# Patient Record
Sex: Female | Born: 1985 | Race: Black or African American | Hispanic: No | Marital: Married | Smoking: Never smoker
Health system: Southern US, Community
[De-identification: ages and names within clinical notes are randomized; demographics above are authoritative.]

## PROBLEM LIST (undated history)

## (undated) DIAGNOSIS — Z30430 Encounter for insertion of intrauterine contraceptive device: Secondary | ICD-10-CM

## (undated) DIAGNOSIS — E538 Deficiency of other specified B group vitamins: Secondary | ICD-10-CM

## (undated) DIAGNOSIS — A549 Gonococcal infection, unspecified: Secondary | ICD-10-CM

## (undated) DIAGNOSIS — Z8719 Personal history of other diseases of the digestive system: Secondary | ICD-10-CM

## (undated) DIAGNOSIS — E669 Obesity, unspecified: Secondary | ICD-10-CM

## (undated) DIAGNOSIS — A539 Syphilis, unspecified: Secondary | ICD-10-CM

## (undated) DIAGNOSIS — R519 Headache, unspecified: Secondary | ICD-10-CM

## (undated) DIAGNOSIS — E559 Vitamin D deficiency, unspecified: Secondary | ICD-10-CM

## (undated) DIAGNOSIS — Z8619 Personal history of other infectious and parasitic diseases: Secondary | ICD-10-CM

## (undated) DIAGNOSIS — M2550 Pain in unspecified joint: Secondary | ICD-10-CM

## (undated) DIAGNOSIS — O0990 Supervision of high risk pregnancy, unspecified, unspecified trimester: Secondary | ICD-10-CM

## (undated) DIAGNOSIS — R079 Chest pain, unspecified: Secondary | ICD-10-CM

## (undated) DIAGNOSIS — A6000 Herpesviral infection of urogenital system, unspecified: Secondary | ICD-10-CM

## (undated) DIAGNOSIS — K219 Gastro-esophageal reflux disease without esophagitis: Secondary | ICD-10-CM

## (undated) DIAGNOSIS — E282 Polycystic ovarian syndrome: Secondary | ICD-10-CM

## (undated) DIAGNOSIS — G4730 Sleep apnea, unspecified: Secondary | ICD-10-CM

## (undated) DIAGNOSIS — M549 Dorsalgia, unspecified: Secondary | ICD-10-CM

## (undated) DIAGNOSIS — D649 Anemia, unspecified: Secondary | ICD-10-CM

## (undated) HISTORY — DX: Chest pain, unspecified: R07.9

## (undated) HISTORY — DX: Supervision of high risk pregnancy, unspecified, unspecified trimester: O09.90

## (undated) HISTORY — DX: Syphilis, unspecified: A53.9

## (undated) HISTORY — DX: Dorsalgia, unspecified: M54.9

## (undated) HISTORY — DX: Herpesviral infection of urogenital system, unspecified: A60.00

## (undated) HISTORY — DX: Obesity, unspecified: E66.9

## (undated) HISTORY — DX: Polycystic ovarian syndrome: E28.2

## (undated) HISTORY — DX: Gonococcal infection, unspecified: A54.9

## (undated) HISTORY — DX: Encounter for insertion of intrauterine contraceptive device: Z30.430

## (undated) HISTORY — DX: Personal history of other infectious and parasitic diseases: Z86.19

## (undated) HISTORY — DX: Deficiency of other specified B group vitamins: E53.8

## (undated) HISTORY — PX: BREAST SURGERY: SHX581

## (undated) HISTORY — DX: Pain in unspecified joint: M25.50

## (undated) HISTORY — PX: CHOLECYSTECTOMY: SHX55

## (undated) HISTORY — DX: Anemia, unspecified: D64.9

## (undated) HISTORY — PX: EYE SURGERY: SHX253

## (undated) HISTORY — PX: LAPAROSCOPIC GASTRIC SLEEVE RESECTION: SHX5895

## (undated) HISTORY — DX: Vitamin D deficiency, unspecified: E55.9

## (undated) NOTE — *Deleted (*Deleted)
TREATMENT   Manual Therapy C2-C7 CPA and UPA, grade I-II, 20s/bout x 1 bout on each level centrally as well as laterally bilateral, pt with less tenderness and guarding today compared to previous session; STM to L upper trap, levator scapulae, cervical paraspinals, and suboccipitals; Gentle manual cervical traction 15s hold/15s relax x 3; Suboccipital release x 20s;   Ther-ex Supine repeated cervical retractions with overpressure from therapist 5s hold x 10; Supine upper cervical nods 5s hold x 10; Supine cervical rotation, lateral flexion, and flexion isometrics 5s hold x 5 each bilaterally; Seated rows with green tband 2s hold x 10, cues for scapular retraction;   Pt educated throughout session about proper posture and technique with exercises. Improved exercise technique, movement at target joints, use of target muscles after min to mod verbal, visual, tactile cues.   Patient demonstrates excellent motivation and denies any increase in her pain during session. Overall she reports improvement of at least 80% with respect to her headaches and neck pain since initiating therapy.  She arrived late for session so time was somewhat limited today.Continued with cervical mobilizations and pt is less tender today.  Continued with deep neck flexor strengthening and initiated periscapular strengthening with seated rows.  Hopefully as patient continues to improve she will be able to decrease the frequency of her PT on the way towards a discharge.  Patient advised of neutral cervical positioning during sleeping and is able to trial different pillows to see if it helps with her sleep.  Patient also mentioned that she has OSA and has discussed with her MD the possible need for an additional sleep study to titrate her machine as she has not been wearing her sleep apnea machine.  Patient encouraged to continue her HEP and follow-up as scheduled.  Shewill benefit from PT services to address  deficits in headachesin order to return to full function at home.

---
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
## 2011-07-23 ENCOUNTER — Ambulatory Visit: Payer: Self-pay | Admitting: Family Medicine

## 2011-08-02 ENCOUNTER — Ambulatory Visit (INDEPENDENT_AMBULATORY_CARE_PROVIDER_SITE_OTHER): Payer: Managed Care, Other (non HMO) | Admitting: Family Medicine

## 2011-08-02 ENCOUNTER — Encounter: Payer: Self-pay | Admitting: Family Medicine

## 2011-08-02 VITALS — BP 102/80 | HR 83 | Temp 98.0°F | Ht 61.0 in | Wt 233.8 lb

## 2011-08-02 DIAGNOSIS — Z0000 Encounter for general adult medical examination without abnormal findings: Secondary | ICD-10-CM

## 2011-08-02 DIAGNOSIS — E669 Obesity, unspecified: Secondary | ICD-10-CM

## 2011-08-02 DIAGNOSIS — R0683 Snoring: Secondary | ICD-10-CM

## 2011-08-02 DIAGNOSIS — G4700 Insomnia, unspecified: Secondary | ICD-10-CM | POA: Insufficient documentation

## 2011-08-02 DIAGNOSIS — Z136 Encounter for screening for cardiovascular disorders: Secondary | ICD-10-CM

## 2011-08-02 DIAGNOSIS — R0609 Other forms of dyspnea: Secondary | ICD-10-CM

## 2011-08-02 DIAGNOSIS — Z113 Encounter for screening for infections with a predominantly sexual mode of transmission: Secondary | ICD-10-CM

## 2011-08-02 DIAGNOSIS — G4733 Obstructive sleep apnea (adult) (pediatric): Secondary | ICD-10-CM | POA: Insufficient documentation

## 2011-08-02 MED ORDER — TRAZODONE HCL 50 MG PO TABS: 25.0000 mg | ORAL_TABLET | Freq: Every evening | ORAL | Status: DC | PRN

## 2011-08-02 NOTE — Progress Notes (Signed)
Subjective:    Patient ID: Nancy Owen, female    DOB: 09/12/1985, 26 y.o.   MRN: [id number]  HPI 26 yo here to establish care.  Insomnia- past few years, increasing issues with falling and staying asleep.  Graduated from college and now working in a job that she is not pleased with.  Ultimately, she would like to go to law school.  Lives with parents who are supportive.  Feels a little down at times but denies being tearful.  She is a little anxious about her job situation.  Eating more, has gained 30 pounds in past year or so. No SI or HI.  Snores- boyfriend says that she often stops breathing in her sleep.  She often wakes her self up snoring.  Obesity- BMI 44.  Motivated to loose weight but is not going to gym due to her work location and sharing a car with her family. She knows she eats too much and would like to change her eating habits.  H/o syphilis- would like to be tested for STDs today.  Denies any vaginal discharge or dysuria.  Patient Active Problem List  Diagnoses  . Snoring  . Insomnia  . Screening for STD (sexually transmitted disease)  . Obesity   Past Medical History  Diagnosis Date  . History of syphilis   . Obesity    Past Surgical History  Procedure Date  . Fracture surgery    History  Substance Use Topics  . Smoking status: Never Smoker   . Smokeless tobacco: Not on file  . Alcohol Use: Not on file   No family history on file. No Known Allergies No current outpatient prescriptions on file prior to visit.   The PMH, PSH, Social History, Family History, Medications, and allergies have been reviewed in [facility], and have been updated if relevant.    Review of Systems    See HPI Patient reports no   fever ,adenopathy, persistant / recurrent hoarseness, swallowing issues, chest pain, edema,persistant / recurrent cough, hemoptysis, dyspnea(rest, exertional, paroxysmal nocturnal), gastrointestinal  bleeding (melena, rectal bleeding), abdominal pain,  excessive heart burn, GU symptoms(dysuria, hematuria, pyuria, voiding/incontinence  Issues) syncope, focal weakness, severe memory loss, concerning skin lesions, abnormal bruising/bleeding, major joint swelling, breast masses or abnormal vaginal bleeding.    Objective:   Physical Exam BP 102/80  Pulse 83  Temp(Src) 98 F (36.7 C) (Oral)  Ht 5\' 1"  (1.549 m)  Wt 233 lb 12 oz (106.028 kg)  BMI 44.17 kg/m2  LMP 07/15/2011  General:  Well-developed,well-nourished,in no acute distress; alert,appropriate and cooperative throughout examination, obese Head:  normocephalic and atraumatic.   Eyes:  vision grossly intact, pupils equal, pupils round, and pupils reactive to light.   Ears:  R ear normal and L ear normal.   Nose:  no external deformity.   Mouth:  good dentition.   Neck:  No deformities, masses, or tenderness noted. Lungs:  Normal respiratory effort, chest expands symmetrically. Lungs are clear to auscultation, no crackles or wheezes. Heart:  Normal rate and regular rhythm. S1 and S2 normal without gallop, murmur, click, rub or other extra sounds. Abdomen:  Bowel sounds positive,abdomen soft and non-tender without masses, organomegaly or hernias noted. Msk:  No deformity or scoliosis noted of thoracic or lumbar spine.   Extremities:  No clubbing, cyanosis, edema, or deformity noted with normal full range of motion of all joints.   Neurologic:  alert & oriented X3 and gait normal.   Skin:  Intact without suspicious lesions or  rashes Psych:  Cognition and judgment appear intact. Alert and cooperative with normal attention span and concentration. No apparent delusions, illusions, hallucinations    Assessment & Plan:   1. Snoring  Deteriorated- her weight is an issue and she is aware of this, see below.  Will also refer to pulmonary for possible sleep study to rule out sleep apnea. Ambulatory referral to Pulmonology  2. Insomnia  See above.  Adjustment disorder likely also playing a  roll.  Will start Trazodone 25-50 mg qhs prn insomnia. The patient indicates understanding of these issues and agrees with the plan.  Ambulatory referral to Pulmonology  3. Screening for STDs HIV Antibody ( Reflex), RPR, GC/chlamydia probe amp, urine  4. Obesity  Deteriorated. Will refer to nutritionist and discussed importance of getting back into an exercise routine. Amb ref to Medical Nutrition Therapy-MNT

## 2011-08-02 NOTE — Patient Instructions (Signed)
Good to see you. Please stop by to see Shirlee Limerick on your way out after you go to the lab.  Trazodone tablets What is this medicine? TRAZODONE (TRAZ oh done) is used to treat depression. This medicine may be used for other purposes; ask your health care provider or pharmacist if you have questions. What should I tell my health care provider before I take this medicine? They need to know if you have any of these conditions: -attempted suicide or thinking about it -bipolar disorder -bleeding problems -heart disease, or previous heart attack -irregular heart beat -kidney or liver disease -low levels of sodium in the blood -an unusual or allergic reaction to trazodone, other medicines, foods, dyes or preservatives -pregnant or trying to get pregnant -breast-feeding How should I use this medicine? Take this medicine by mouth with a glass of water. Follow the directions on the prescription label. Take this medicine shortly after a meal or a light snack. Take your medicine at regular intervals. Do not take your medicine more often than directed. Do not stop taking except on your doctor's advice. A special MedGuide will be given to you by the pharmacist with each prescription and refill. Be sure to read this information carefully each time. Talk to your pediatrician regarding the use of this medicine in children. Special care may be needed. Overdosage: If you think you have taken too much of this medicine contact a poison control center or emergency room at once. NOTE: This medicine is only for you. Do not share this medicine with others. What if I miss a dose? If you miss a dose, take it as soon as you can. If it is almost time for your next dose, take only that dose. Do not take double or extra doses. What may interact with this medicine? Do not take this medicine with any of the following medications: -linezolid -MAOIs like Carbex, Eldepryl, Marplan, Nardil, and Parnate -methylene  blue -nefazodone This medicine may also interact with the following medications: -barbiturates such as phenobarbital -certain antidepressants or tranquilizers -digoxin -herbal medicines that contain kava kava, St John's wort, or valerian -ketoconazole -medicines for HIV or AIDS -medicines for seizures -other medicines for depression -warfarin This list may not describe all possible interactions. Give your health care provider a list of all the medicines, herbs, non-prescription drugs, or dietary supplements you use. Also tell them if you smoke, drink alcohol, or use illegal drugs. Some items may interact with your medicine. What should I watch for while using this medicine? Visit your doctor or health care professional for regular checks on your progress. You may have to take this medicine for two weeks or more before you feel better. Patients and their families should watch out for worsening depression or thoughts of suicide. Also watch out for sudden or severe changes in feelings such as feeling anxious, agitated, panicky, irritable, hostile, aggressive, impulsive, severely restless, overly excited and hyperactive, or not being able to sleep. If this happens, especially at the beginning of antidepressant treatment or after a change in dose, call your health care professional. You may get drowsy, dizzy or have blurred vision. Do not drive, use machinery, or do anything that needs mental alertness until you know how this medicine affects you. Do not stand or sit up quickly, especially if you are an older patient. This reduces the risk of dizzy or fainting spells. Alcohol may increase dizziness or drowsiness. Avoid alcoholic drinks. This medicine can make your mouth dry. Chewing sugarless gum, sucking hard  candy and drinking plenty of water may help. Contact your doctor if the problem does not go away or is severe. Do not treat yourself for coughs, colds, or allergies without asking your doctor or  health care professional for advice. Some ingredients may increase possible side effects. If you are going to have surgery, tell your doctor or health care professional that you are taking this drug. What side effects may I notice from receiving this medicine? Side effects that you should report to your doctor or health care professional as soon as possible: -allergic reactions like skin rash, itching or hives, swelling of the face, lips, or tongue -fast, irregular heartbeat -feeling faint or lightheaded, falls -painful erections or other sexual dysfunction -suicidal thoughts or other mood changes -trembling Side effects that usually do not require medical attention (report to your doctor or health care professional if they continue or are bothersome): -constipation -headache -muscle aches or pains -nausea, vomiting -unusually weak or tired This list may not describe all possible side effects. Call your doctor for medical advice about side effects. You may report side effects to FDA at 1-800-FDA-1088. Where should I keep my medicine? Keep out of the reach of children. Store at room temperature between 15 and 30 degrees C (59 to 86 degrees F). Protect from light. Keep container tightly closed. Throw away any unused medicine after the expiration date. NOTE: This sheet is a summary. It may not cover all possible information. If you have questions about this medicine, talk to your doctor, pharmacist, or health care provider.  2012, Elsevier/Gold Standard. (02/08/2010 5:57:18 PM)

## 2011-08-03 LAB — BASIC METABOLIC PANEL
BUN/Creatinine Ratio: 15 (ref 8–20)
BUN: 11 mg/dL (ref 6–20)
CO2: 25 mmol/L (ref 20–32)
Chloride: 105 mmol/L (ref 97–108)
Glucose: 86 mg/dL (ref 65–99)

## 2011-08-03 LAB — LIPID PANEL
Chol/HDL Ratio: 4.6 ratio units — ABNORMAL HIGH (ref 0.0–4.4)
Triglycerides: 172 mg/dL — ABNORMAL HIGH (ref 0–114)

## 2011-08-03 LAB — RPR: RPR: REACTIVE — AB

## 2011-08-03 LAB — RPR, QUANT. (REFLEX): Rapid Plasma Reagin, Quant: 1:32 {titer} — ABNORMAL HIGH

## 2011-08-04 LAB — GC/CHLAMYDIA PROBE AMP: Chlamydia trachomatis, NAA: NEGATIVE

## 2011-08-05 ENCOUNTER — Other Ambulatory Visit: Payer: Self-pay | Admitting: *Deleted

## 2011-08-05 ENCOUNTER — Other Ambulatory Visit: Payer: Self-pay | Admitting: Family Medicine

## 2011-08-05 DIAGNOSIS — A539 Syphilis, unspecified: Secondary | ICD-10-CM | POA: Insufficient documentation

## 2011-08-05 MED ORDER — AZITHROMYCIN 500 MG PO TABS: 1000.0000 mg | ORAL_TABLET | Freq: Once | ORAL | Status: DC

## 2011-08-06 ENCOUNTER — Ambulatory Visit (INDEPENDENT_AMBULATORY_CARE_PROVIDER_SITE_OTHER): Payer: Managed Care, Other (non HMO) | Admitting: *Deleted

## 2011-08-06 DIAGNOSIS — A64 Unspecified sexually transmitted disease: Secondary | ICD-10-CM

## 2011-08-06 MED ORDER — CEFTRIAXONE SODIUM 1 G IJ SOLR
1.0000 g | Freq: Once | INTRAMUSCULAR | Status: AC
  Administered 2011-08-06: 1 g via INTRAMUSCULAR

## 2011-08-06 NOTE — Progress Notes (Signed)
Patient wanted to know if she will continue to have to get Rocephin shots each time she is tested and her test comes back positive?  Please advise.

## 2011-08-07 ENCOUNTER — Telehealth: Payer: Self-pay | Admitting: *Deleted

## 2011-08-07 NOTE — Telephone Encounter (Signed)
Patient wanted to know if she will continue to have to come in for a Rocephin injection each time her Syphilis test is positive?  Please advise.

## 2011-08-07 NOTE — Telephone Encounter (Signed)
To my knowledge patient only came in to get Rocephin injections, I don't think she had labs done yesterday.

## 2011-08-07 NOTE — Telephone Encounter (Signed)
Did she ever come in to have her other syphilis labs drawn?

## 2011-08-07 NOTE — Telephone Encounter (Signed)
No the Rocephin was not for syphilis.  It was to treat gonorrhea.

## 2011-08-07 NOTE — Telephone Encounter (Signed)
Terri, Do you know if her labs were drawn yesterday? Thanks, [employer]

## 2011-08-08 ENCOUNTER — Other Ambulatory Visit (INDEPENDENT_AMBULATORY_CARE_PROVIDER_SITE_OTHER): Payer: Managed Care, Other (non HMO)

## 2011-08-08 DIAGNOSIS — A539 Syphilis, unspecified: Secondary | ICD-10-CM

## 2011-08-10 LAB — RPR QUALITATIVE: RPR: REACTIVE — AB

## 2011-08-19 ENCOUNTER — Ambulatory Visit (INDEPENDENT_AMBULATORY_CARE_PROVIDER_SITE_OTHER): Payer: Managed Care, Other (non HMO) | Admitting: *Deleted

## 2011-08-19 DIAGNOSIS — A539 Syphilis, unspecified: Secondary | ICD-10-CM

## 2011-08-21 ENCOUNTER — Ambulatory Visit: Payer: Managed Care, Other (non HMO)

## 2011-08-21 ENCOUNTER — Encounter: Payer: Self-pay | Admitting: Pulmonary Disease

## 2011-08-21 ENCOUNTER — Ambulatory Visit (INDEPENDENT_AMBULATORY_CARE_PROVIDER_SITE_OTHER): Payer: Managed Care, Other (non HMO) | Admitting: Pulmonary Disease

## 2011-08-21 VITALS — BP 118/68 | HR 98 | Temp 98.3°F | Ht 61.0 in | Wt 232.8 lb

## 2011-08-21 DIAGNOSIS — G4733 Obstructive sleep apnea (adult) (pediatric): Secondary | ICD-10-CM

## 2011-08-21 DIAGNOSIS — R0683 Snoring: Secondary | ICD-10-CM

## 2011-08-21 MED ORDER — PENICILLIN G BENZATHINE 2400000 UNIT/4ML IM SUSP
2.4000 mL | Freq: Once | INTRAMUSCULAR | Status: AC
  Administered 2011-08-21: 2.4 mL via INTRAMUSCULAR

## 2011-08-21 NOTE — Patient Instructions (Signed)
Will arrange for sleep study, and will call once results are available. Work on weight reduction.

## 2011-08-21 NOTE — Progress Notes (Signed)
  Subjective:    Patient ID: Nancy Owen, female    DOB: [birth year], 26 y.o.   MRN: [id number]  HPI The patient is a 26 year old female who I've been asked to see for possible obstructive sleep apnea.  The patient has been noted to have loud snoring by observers, as well as an abnormal breathing pattern during sleep.  She has also noted frequent snoring and choking arousals, and only has occasional reflux symptoms.  She has frequent awakenings at night, and does not feel rested in the mornings upon arising.  She notes definite sleep pressured during the day with periods of inactivity unless she stays distracted with music or some other stimulation.  She will fall asleep in the evenings while watching television or movies, and then has issues with sleep onset after her napping.  She denies any sleepiness issues while driving.  The patient states that her weight has increased to 45 pounds in the last 2 years, and her Epworth score today is abnormal at 14.  Sleep Questionnaire: What time do you typically go to bed?( Between what hours) 12 midnight- 2 am How long does it take you to fall asleep? approx 45 mins to 1 hour How many times during the night do you wake up? 4 What time do you get out of bed to start your day? 0645 Do you drive or operate heavy machinery in your occupation? No How much has your weight changed (up or down) over the past two years? (In pounds) 45 lb (20.412 kg) Have you ever had a sleep study before? No Do you currently use CPAP? No Do you wear oxygen at any time? No     Review of Systems  Constitutional: Positive for unexpected weight change. Negative for fever.  HENT: Negative for ear pain, nosebleeds, congestion, sore throat, rhinorrhea, sneezing, trouble swallowing, dental problem, postnasal drip and sinus pressure.   Eyes: Negative for redness and itching.  Respiratory: Positive for shortness of breath. Negative for cough, chest tightness and wheezing.   Cardiovascular:  Positive for chest pain. Negative for palpitations and leg swelling.  Gastrointestinal: Positive for abdominal pain. Negative for nausea and vomiting.  Genitourinary: Negative for dysuria.  Musculoskeletal: Negative for joint swelling.  Skin: Negative for rash.  Neurological: Negative for headaches.  Hematological: Does not bruise/bleed easily.  Psychiatric/Behavioral: Negative for dysphoric mood. The patient is nervous/anxious.        Objective:   Physical Exam Constitutional:  Obese female, no acute distress  HENT:  Nares patent without discharge, but large turbinates.  Oropharynx without exudate, palate and uvula are thickened and elongated.  Narrowed posteriorly  Eyes:  Perrla, eomi, no scleral icterus  Neck:  No JVD, no TMG  Cardiovascular:  Normal rate, regular rhythm, no rubs or gallops.  No murmurs        Intact distal pulses  Pulmonary :  Normal breath sounds, no stridor or respiratory distress   No rales, rhonchi, or wheezing  Abdominal:  Soft, nondistended, bowel sounds present.  No tenderness noted.   Musculoskeletal:  1+ lower extremity edema noted, L>R  Lymph Nodes:  No cervical lymphadenopathy noted  Skin:  No cyanosis noted  Neurologic:  Alert, appropriate, moves all 4 extremities without obvious deficit.         Assessment & Plan:

## 2011-08-21 NOTE — Assessment & Plan Note (Signed)
The patient's history is very suggestive of obstructive sleep apnea.  She is obese, has abnormal upper airway anatomy, and is very symptomatic during the day.  I think her sleep onset issues are due to catnapping at night before going to bed.  I had a long discussion with her about sleep apnea, including its impact on her cardiovascular health and quality of life.  I think she needs to have a sleep study done, and the patient is agreeable.  I have encouraged her to work aggressively on weight loss.

## 2011-08-27 ENCOUNTER — Ambulatory Visit: Payer: Managed Care, Other (non HMO) | Admitting: *Deleted

## 2011-09-04 ENCOUNTER — Encounter: Payer: Self-pay | Admitting: *Deleted

## 2011-09-04 ENCOUNTER — Encounter: Payer: Managed Care, Other (non HMO) | Attending: Family Medicine | Admitting: *Deleted

## 2011-09-04 DIAGNOSIS — E669 Obesity, unspecified: Secondary | ICD-10-CM

## 2011-09-04 DIAGNOSIS — Z713 Dietary counseling and surveillance: Secondary | ICD-10-CM | POA: Insufficient documentation

## 2011-09-04 NOTE — Progress Notes (Signed)
  Medical Nutrition Therapy:  Appt start time: 1630 end time:  1730.  Assessment:  Primary concerns today: patient here for nutritional counseling for weight loss. She has recently moved here from [place] where she attended college. She is living with her parents and her younger brother and is working in [employer] of [employer].She has not met many friends here yet. She is interested in going to [employer] in the next year or two.   MEDICATIONS: see list   DIETARY INTAKE:  Usual eating pattern includes 3 meals and 1-3 snacks per day.  Everyday foods include food variety of all food groups.  Avoided foods include soda, apples, onions, [place] food, coffee, sweet drinks.    24-hr recall:  B ( AM): flat bread, 1 egg with pepper jack cheese, hot cocoa  Snk ( AM): occasional plain crackers, fresh fruit or Chex Mix  L ( PM): bring from home; Healthy Choice or Smart One dinner, OR salad with peppers, cuke, tomato, croutons, lite dressing with chicken, water Snk ( PM): same as morning, occasionally 1 bag M&M's about twice a week D ( PM): eats with family or prepares own; meat, starch, vegetable,  Snk ( PM): none Beverages: water, hot tea, hot cocoa, Silk soy milk  Usual physical activity: plans to attend [employer] classes at gym as able  Estimated energy needs: 1400 calories 158 g carbohydrates 105 g protein 39 g fat  Progress Towards Goal(s):  In progress.   Nutritional Diagnosis:  NI-1.5 Excessive energy intake As related to obesity.  As evidenced by BMI of 43.6% .    Intervention:  Nutrition counseling provided on calorie content of major nutrients, carb counting, reading food labels and way to increase activity level within her work schedule. Plan: Aim for 2-3 Carb Choices (30-45 grams) per meal, 0-1 per snack if hungry Read food labels for total carb of foods at home Increase activity every day by dancing either at home or join class at gym Consider Plate Method of portion  control by limiting both starch and meat to 1/4 of plate and give veggies 1/2 of plate  Handouts given during visit include:  Carb Counting and reading Food Labels  Meal plan card  Plate Method of Portion control  Monitoring/Evaluation:  Dietary intake, exercise, reading food labels, and body weight in 4 week(s).

## 2011-09-04 NOTE — Patient Instructions (Addendum)
Plan: Aim for 2-3 Carb Choices (30-45 grams) per meal, 0-1 per snack if hungry Read food labels for total carb of foods at home Increase activity every day by dancing either at home or join class at gym Consider Plate Method of portion control by limiting both starch and meat to 1/4 of plate and give veggies 1/2 of plate

## 2011-09-10 ENCOUNTER — Telehealth: Payer: Self-pay | Admitting: *Deleted

## 2011-09-10 NOTE — Telephone Encounter (Signed)
Ok to send

## 2011-09-10 NOTE — Telephone Encounter (Signed)
Received a call from Nancy Owen requesting that we send labs/reports of all positive STD test that this patient has had.  Fax to [place] at [phone].  Please advise.

## 2011-09-11 NOTE — Telephone Encounter (Signed)
Copies of test results from 08/05/11 faxed.

## 2011-09-12 ENCOUNTER — Ambulatory Visit (HOSPITAL_BASED_OUTPATIENT_CLINIC_OR_DEPARTMENT_OTHER): Payer: Managed Care, Other (non HMO) | Attending: Pulmonary Disease | Admitting: Radiology

## 2011-09-12 VITALS — Ht 61.0 in | Wt 225.0 lb

## 2011-09-12 DIAGNOSIS — R0683 Snoring: Secondary | ICD-10-CM

## 2011-09-12 DIAGNOSIS — G4733 Obstructive sleep apnea (adult) (pediatric): Secondary | ICD-10-CM | POA: Insufficient documentation

## 2011-09-24 ENCOUNTER — Telehealth: Payer: Self-pay | Admitting: *Deleted

## 2011-09-24 DIAGNOSIS — G4733 Obstructive sleep apnea (adult) (pediatric): Secondary | ICD-10-CM

## 2011-09-24 NOTE — Telephone Encounter (Signed)
Per KC, pt needs ov with him to discuss sleep study results.  Called and spoke with pt. Pt scheduled to come in on Friday 3/15 at 4:30 pm

## 2011-09-25 NOTE — Procedures (Signed)
Nancy Owen, Nancy Owen            ACCOUNT NO.:  [id number]  MEDICAL RECORD NO.:  [id number]          PATIENT TYPE:  OUT  LOCATION:  SLEEP CENTER                 FACILITY[facility]  [facility]  PHYSICIAN:  Barbaraann Share, MD,FCCPDATE OF BIRTH:  27-Jan-1988  DATE OF STUDY:  09/12/2011                           NOCTURNAL POLYSOMNOGRAM  REFERRING PHYSICIAN:  Barbaraann Share, MD,FCCP  LOCATION:  Sleep Lab.  REFERRING PHYSICIAN:  Barbaraann Share, MD,FCCP  INDICATION FOR STUDY:  Hypersomnia with sleep apnea.  EPWORTH SLEEPINESS SCORE:  14.  MEDICATIONS:  SLEEP ARCHITECTURE:  The patient had a total sleep time of 379 minutes with no slow-wave sleep and only 92 minutes of REM.  Sleep onset latency was normal at 14 minutes, and REM onset was normal at 72 minutes.  Sleep efficiency was excellent at 95%.  RESPIRATORY DATA:  The patient was found to have 77 apneas and 47 hypopneas, giving her an apnea-hypopnea index of 20 events per hour. The events occurred in all body positions, but were primarily during REM.  There was moderate snoring noted throughout.  OXYGEN DATA:  There was O2 desaturation as low as 80% with the patient's obstructive events.  CARDIAC DATA:  Occasional PAC noted, but no clinically significant arrhythmias were seen.  MOVEMENT-PARASOMNIA:  The patient had no significant leg jerks or other abnormal behaviors noted.  IMPRESSIONS-RECOMMENDATIONS: 1. Moderate obstructive sleep apnea/hypopnea syndrome with an AHI of     20 events per hour and O2 desaturation as low as 80%.  Treatment     for this degree of sleep apnea can include a trial of weight loss     alone, upper airway     surgery, dental appliance, and continuous positive airway pressure.     Clinical correlation is suggested. 2. Occasional premature atrial contraction noted, but no clinically     significant arrhythmias were seen.     Barbaraann Share, MD,FCCP Diplomate, American Board of  Sleep Medicine    [facility]  D:  09/24/2011 08:38:59  T:  09/25/2011 01:24:06  Job:  [id number]

## 2011-09-27 ENCOUNTER — Ambulatory Visit (INDEPENDENT_AMBULATORY_CARE_PROVIDER_SITE_OTHER): Payer: Managed Care, Other (non HMO) | Admitting: Pulmonary Disease

## 2011-09-27 ENCOUNTER — Encounter: Payer: Self-pay | Admitting: Pulmonary Disease

## 2011-09-27 VITALS — BP 110/70 | HR 78 | Temp 98.2°F | Ht 61.0 in | Wt 230.2 lb

## 2011-09-27 DIAGNOSIS — G4733 Obstructive sleep apnea (adult) (pediatric): Secondary | ICD-10-CM

## 2011-09-27 NOTE — Progress Notes (Signed)
  Subjective:    Patient ID: Nancy Owen, female    DOB: Aug 07, 1985, 26 y.o.   MRN: [id number]  HPI The patient comes in today for followup after her recent sleep study.  She was found to have moderate sleep apnea, with an AHI of 20 events per hour.  I have reviewed the study with her in detail, and answered all of her questions.   Review of Systems  Constitutional: Negative for fever and unexpected weight change.  HENT: Negative for ear pain, nosebleeds, congestion, sore throat, rhinorrhea, sneezing, trouble swallowing, dental problem, postnasal drip and sinus pressure.   Eyes: Negative for redness and itching.  Respiratory: Negative for cough, chest tightness, shortness of breath and wheezing.   Cardiovascular: Negative for palpitations and leg swelling.  Gastrointestinal: Negative for nausea and vomiting.  Genitourinary: Negative for dysuria.  Musculoskeletal: Negative for joint swelling.  Skin: Negative for rash.  Neurological: Positive for headaches.  Hematological: Does not bruise/bleed easily.  Psychiatric/Behavioral: Negative for dysphoric mood. The patient is not nervous/anxious.        Objective:   Physical Exam Obese female in no acute distress Nose without purulence or discharge noted Lower extremities without edema, no cyanosis Alert and oriented, moves all 4 extremities.       Assessment & Plan:

## 2011-09-27 NOTE — Assessment & Plan Note (Signed)
The patient has moderate obstructive sleep apnea by her recent sleep study, and has significant sleep disruption and daytime symptoms.  I have reviewed the various treatment options with her, including a trial of weight loss alone, upper airway surgery, dental appliance, and also CPAP.  After discussing with her, the patient wishes to try CPAP while working on weight loss.  I agreed this is her best option. I will set the patient up on cpap at a moderate pressure level to allow for desensitization, and will troubleshoot the device over the next 4-6weeks if needed.  The pt is to call me if having issues with tolerance.  Will then optimize the pressure once patient is able to wear cpap on a consistent basis.

## 2011-09-27 NOTE — Patient Instructions (Signed)
Will set up on cpap.  Please call if having tolerance issues. Work weight reduction followup with me in 6 weeks.

## 2011-10-02 ENCOUNTER — Ambulatory Visit: Payer: Managed Care, Other (non HMO) | Admitting: *Deleted

## 2011-10-18 ENCOUNTER — Ambulatory Visit: Payer: Managed Care, Other (non HMO) | Admitting: Family Medicine

## 2011-11-01 ENCOUNTER — Ambulatory Visit: Payer: Managed Care, Other (non HMO) | Admitting: Pulmonary Disease

## 2011-12-17 ENCOUNTER — Encounter: Payer: Self-pay | Admitting: Family Medicine

## 2011-12-17 ENCOUNTER — Ambulatory Visit (INDEPENDENT_AMBULATORY_CARE_PROVIDER_SITE_OTHER): Payer: Managed Care, Other (non HMO) | Admitting: Family Medicine

## 2011-12-17 VITALS — BP 102/70 | HR 84 | Temp 98.0°F | Wt 233.0 lb

## 2011-12-17 DIAGNOSIS — A539 Syphilis, unspecified: Secondary | ICD-10-CM

## 2011-12-17 MED ORDER — PENICILLIN G BENZATHINE 1200000 UNIT/2ML IM SUSP
2.4000 10*6.[IU] | Freq: Once | INTRAMUSCULAR | Status: AC
  Administered 2011-12-17: 2.4 10*6.[IU] via INTRAMUSCULAR

## 2011-12-17 NOTE — Patient Instructions (Signed)
Please come back next week (and the week after) to receive your two other doses of penicillin. Please return in August for a lab visit to recheck your quantitative syphilis count.

## 2011-12-17 NOTE — Progress Notes (Signed)
Addended by: Eliezer Bottom on: 12/17/2011 11:21 AM   Modules accepted: Orders

## 2011-12-17 NOTE — Progress Notes (Signed)
  Subjective:    Patient ID: Nancy Owen, female    DOB: 01/22/1986, 26 y.o.   MRN: [id number]  HPI  26 yo here to follow up STDs.  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  Receive Penicillin G 2.4 on 08/28/2011. Per pt, had a h/o testing positive for syphilis within the year prior however today she states she received three doses of penicillin G 2.4 in December 2011.  Also tested positive for gonorrhea on 1/21. Given Rocephin 250 mg IM x 1 injection and send in rx for Azithromycin 1 gram x 1. She is not sure if this latest partner that gave her gonorrhea had syphillis.  She is no longer with him.  She would be due to have repeat RPR quant in August 2013 to make sure that titers were coming down.  She is here today for follow up.    Patient Active Problem List  Diagnoses  . OSA (obstructive sleep apnea)  . Insomnia  . Screening for STD (sexually transmitted disease)  . Obesity  . Syphilis   Past Medical History  Diagnosis Date  . History of syphilis   . Obesity   . Allergic rhinitis    Past Surgical History  Procedure Date  . Fracture surgery age 89    L left   History  Substance Use Topics  . Smoking status: Never Smoker   . Smokeless tobacco: Not on file  . Alcohol Use: Yes     not much now, just at holidays wine cooler   No family history on file. No Known Allergies Current Outpatient Prescriptions on File Prior to Visit  Medication Sig Dispense Refill  . Norethin-Eth Estradiol-Fe [facility] FE) 0.4-35 MG-MCG tablet Chew 1 tablet by mouth daily.      Marland Kitchen DISCONTD: traZODone (DESYREL) 50 MG tablet Take 0.5-1 tablets (25-50 mg total) by mouth at bedtime as needed for sleep.  30 tablet  3  . DISCONTD: traZODone (DESYREL) 50 MG tablet Take 50 mg by mouth at bedtime.       The PMH, PSH, Social History, Family History, Medications, and allergies have been reviewed in [facility], and have been updated if relevant.    Review of Systems See HPI      Objective:   Physical Exam BP 102/70  Pulse 84  Temp(Src) 98 F (36.7 C) (Oral)  Wt 233 lb (105.688 kg)  General:  Well-developed,well-nourished,in no acute distress; alert,appropriate and cooperative throughout examination Head:  normocephalic and atraumatic.   Msk:  No deformity or scoliosis noted of thoracic or lumbar spine.   Extremities:  No clubbing, cyanosis, edema, or deformity noted with normal full range of motion of all joints.   Neurologic:  alert & oriented X3 and gait normal.   Skin:  Intact without suspicious lesions or rashes Psych:  Cognition and judgment appear intact. Alert and cooperative with normal attention span and concentration. No apparent delusions, illusions, hallucinations      Assessment & Plan:   1. Syphilis  RPR, Rfx Qn RPR/Confirm TP   Unchanged.  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 Will treat with 3 more doses (weekly intervals) of penicillin- first dose received today. Recheck quant titers in August (6 months after she received first dose). The patient indicates understanding of these issues and agrees with the plan.

## 2011-12-24 ENCOUNTER — Ambulatory Visit (INDEPENDENT_AMBULATORY_CARE_PROVIDER_SITE_OTHER): Payer: Managed Care, Other (non HMO) | Admitting: Family Medicine

## 2011-12-24 DIAGNOSIS — A539 Syphilis, unspecified: Secondary | ICD-10-CM

## 2011-12-24 MED ORDER — PENICILLIN G BENZATHINE 1200000 UNIT/2ML IM SUSP
2.4000 10*6.[IU] | Freq: Once | INTRAMUSCULAR | Status: AC
  Administered 2011-12-24: 2.4 10*6.[IU] via INTRAMUSCULAR

## 2011-12-31 ENCOUNTER — Ambulatory Visit (INDEPENDENT_AMBULATORY_CARE_PROVIDER_SITE_OTHER): Payer: Managed Care, Other (non HMO) | Admitting: *Deleted

## 2011-12-31 DIAGNOSIS — A539 Syphilis, unspecified: Secondary | ICD-10-CM

## 2012-01-01 MED ORDER — PENICILLIN G BENZATHINE 1200000 UNIT/2ML IM SUSP
1.2000 10*6.[IU] | Freq: Once | INTRAMUSCULAR | Status: AC
  Administered 2011-12-31: 1.2 10*6.[IU] via INTRAMUSCULAR

## 2012-01-20 ENCOUNTER — Ambulatory Visit: Payer: Managed Care, Other (non HMO) | Admitting: Family Medicine

## 2012-01-23 ENCOUNTER — Encounter: Payer: Self-pay | Admitting: Family Medicine

## 2012-01-23 ENCOUNTER — Ambulatory Visit (INDEPENDENT_AMBULATORY_CARE_PROVIDER_SITE_OTHER): Payer: Managed Care, Other (non HMO) | Admitting: Family Medicine

## 2012-01-23 VITALS — BP 108/68 | HR 76 | Temp 98.1°F | Wt 239.0 lb

## 2012-01-23 DIAGNOSIS — Z3201 Encounter for pregnancy test, result positive: Secondary | ICD-10-CM

## 2012-01-23 MED ORDER — PRENATABS FA PO TABS: ORAL_TABLET | ORAL | Status: DC

## 2012-01-23 NOTE — Progress Notes (Signed)
  Subjective:    Patient ID: Nancy Owen, female    DOB: 01-Mar-1986, 26 y.o.   MRN: [id number]  HPI  26 yo G0 with h/o syphillis and gonorrhea here for positive home pregnancy test.  LMP 5/23.  She has had some breast tenderness. No n/v/d. She has been tired.  Boyfriend is supportive. She has not started taking a prenatal vitamin yet.  Patient Active Problem List  Diagnosis  . OSA (obstructive sleep apnea)  . Insomnia  . Screening for STD (sexually transmitted disease)  . Obesity  . Syphilis  . Positive pregnancy test   Past Medical History  Diagnosis Date  . History of syphilis   . Obesity   . Allergic rhinitis    Past Surgical History  Procedure Date  . Fracture surgery age 25    L left   History  Substance Use Topics  . Smoking status: Never Smoker   . Smokeless tobacco: Not on file  . Alcohol Use: Yes     not much now, just at holidays wine cooler   No family history on file. No Known Allergies Current Outpatient Prescriptions on File Prior to Visit  Medication Sig Dispense Refill  . Norethin-Eth Estradiol-Fe [facility] FE) 0.4-35 MG-MCG tablet Chew 1 tablet by mouth daily.       The PMH, PSH, Social History, Family History, Medications, and allergies have been reviewed in [facility], and have been updated if relevant.   Review of Systems See HPI    Objective:   Physical Exam BP 108/68  Pulse 76  Temp 98.1 F (36.7 C)  Wt 239 lb (108.41 kg)  LMP 12/05/2011 Gen:  Alert, pleasant, NAD Psych:  Good eye contact, not anxious or depressed appearing.    Assessment & Plan:   1. Positive pregnancy test  Ambulatory referral to Obstetrics / Gynecology   New- refer to OBGYN. Based on dates, [redacted] weeks pregnant today. Start PNV. See pt instructions for details.

## 2012-01-23 NOTE — Addendum Note (Signed)
Addended by: Eliezer Bottom on: 01/23/2012 02:44 PM   Modules accepted: Orders

## 2012-01-23 NOTE — Patient Instructions (Addendum)
Congratulations. Please start taking prenatal vitamins. Please stop by to see Shirlee Limerick on your way out.  Pregnancy If you are planning on getting pregnant, it is a good idea to make a preconception appointment with your care- giver to discuss having a healthy lifestyle before getting pregnant. Such as, diet, weight, exercise, taking prenatal vitamins especially folic acid (it helps prevent brain and spinal cord defects), avoiding alcohol, smoking and illegal drugs, medical problems (diabetes, convulsions), family history of genetic problems, working conditions and immunizations. It is better to have knowledge of these things and do something about them before getting pregnant. In your pregnancy, it is important to follow certain guidelines to have a healthy baby. It is very important to get good prenatal care and follow your caregiver's instructions. Prenatal care includes all the medical care you receive before your baby's birth. This helps to prevent problems during the pregnancy and childbirth. HOME CARE INSTRUCTIONS   Start your prenatal visits by the 12th week of pregnancy or before when possible. They are usually scheduled monthly at first. They are more often in the last 2 months before delivery. It is important that you keep your caregiver's appointments and follow your caregiver's instructions regarding medication use, exercise, and diet.   During pregnancy, you are providing food for you and your baby. Eat a regular, well-balanced diet. Choose foods such as meat, fish, milk and other dairy products, vegetables, fruits, whole-grain breads and cereals. Your caregiver will inform you of the ideal weight gain depending on your current height and weight. Drink lots of liquids. Try to drink 8 glasses of water a day.   Alcohol is associated with a number of birth defects including fetal alcohol syndrome. It is best to avoid alcohol completely. Smoking will cause low birth rate and prematurity. Use of  alcohol and nicotine during your pregnancy also increases the chances that your child will be chemically dependent later in their life and may contribute to SIDS (Sudden Infant Death Syndrome).   Do not use illegal drugs.   Only take prescription or over-the-counter medications that are recommended by your caregiver. Other medications can cause genetic and physical problems in the baby.   Morning sickness can often be helped by keeping soda crackers at the bedside. Eat a couple before arising in the morning.   A sexual relationship may be continued until near the end of pregnancy if there are no other problems such as early (premature) leaking of amniotic fluid from the membranes, vaginal bleeding, painful intercourse or belly (abdominal) pain.   Exercise regularly. Check with your caregiver if you are unsure of the safety of some of your exercises.   Do not use hot tubs, steam rooms or saunas. These increase the risk of fainting or passing out and hurting yourself and the baby. Swimming is OK for exercise. Get plenty of rest, including afternoon naps when possible especially in the third trimester.   Avoid toxic odors and chemicals.   Do not wear high heels. They may cause you to lose your balance and fall.   Do not lift over 5 pounds. If you do lift anything, lift with your legs and thighs, not your back.   Avoid long trips, especially in the third trimester.   If you have to travel out of the city or state, take a copy of your medical records with you.  SEEK IMMEDIATE MEDICAL CARE IF:   You develop an unexplained oral temperature above 102 F (38.9 C), or as your caregiver  suggests.   You have leaking of fluid from the vagina. If leaking membranes are suspected, take your temperature and inform your caregiver of this when you call.   There is vaginal spotting or bleeding. Notify your caregiver of the amount and how many pads are used.   You continue to feel sick to your stomach  (nauseous) and have no relief from remedies suggested, or you throw up (vomit) blood or coffee ground like materials.   You develop upper abdominal pain.   You have round ligament discomfort in the lower abdominal area. This still must be evaluated by your caregiver.   You feel contractions of the uterus.   You do not feel the baby move, or there is less movement than before.   You have painful urination.   You have abnormal vaginal discharge.   You have persistent diarrhea.   You get a severe headache.   You have problems with your vision.   You develop muscle weakness.   You feel dizzy and faint.   You develop shortness of breath.   You develop chest pain.   You have back pain that travels down to your leg and feet.   You feel irregular or a very fast heartbeat.   You develop excessive weight gain in a short period of time (5 pounds in 3 to 5 days).   You are involved with a domestic violence situation.  Document Released: 07/01/2005 Document Revised: 06/20/2011 Document Reviewed: 12/23/2008 [facility] Patient Information 2012 [place], [place].

## 2012-02-13 ENCOUNTER — Other Ambulatory Visit (INDEPENDENT_AMBULATORY_CARE_PROVIDER_SITE_OTHER): Payer: Managed Care, Other (non HMO)

## 2012-02-13 DIAGNOSIS — A539 Syphilis, unspecified: Secondary | ICD-10-CM

## 2012-02-14 ENCOUNTER — Other Ambulatory Visit: Payer: Managed Care, Other (non HMO)

## 2012-02-15 LAB — RPR QUALITATIVE: RPR: REACTIVE — AB

## 2012-05-21 ENCOUNTER — Other Ambulatory Visit: Payer: Managed Care, Other (non HMO)

## 2012-06-24 ENCOUNTER — Encounter: Payer: Self-pay | Admitting: Family Medicine

## 2012-06-24 ENCOUNTER — Other Ambulatory Visit (HOSPITAL_COMMUNITY)
Admission: RE | Admit: 2012-06-24 | Discharge: 2012-06-24 | Disposition: A | Discharge status: Home / Self Care | Payer: Managed Care, Other (non HMO) | Source: Ambulatory Visit | Attending: Family Medicine | Admitting: Family Medicine

## 2012-06-24 ENCOUNTER — Ambulatory Visit (INDEPENDENT_AMBULATORY_CARE_PROVIDER_SITE_OTHER): Payer: Managed Care, Other (non HMO) | Admitting: Family Medicine

## 2012-06-24 ENCOUNTER — Other Ambulatory Visit: Payer: Managed Care, Other (non HMO)

## 2012-06-24 VITALS — BP 100/64 | HR 76 | Temp 98.1°F | Ht 61.0 in | Wt 232.0 lb

## 2012-06-24 DIAGNOSIS — Z136 Encounter for screening for cardiovascular disorders: Secondary | ICD-10-CM

## 2012-06-24 DIAGNOSIS — Z23 Encounter for immunization: Secondary | ICD-10-CM

## 2012-06-24 DIAGNOSIS — Z01419 Encounter for gynecological examination (general) (routine) without abnormal findings: Secondary | ICD-10-CM | POA: Insufficient documentation

## 2012-06-24 DIAGNOSIS — Z113 Encounter for screening for infections with a predominantly sexual mode of transmission: Secondary | ICD-10-CM | POA: Insufficient documentation

## 2012-06-24 DIAGNOSIS — Z8619 Personal history of other infectious and parasitic diseases: Secondary | ICD-10-CM

## 2012-06-24 DIAGNOSIS — Z0000 Encounter for general adult medical examination without abnormal findings: Secondary | ICD-10-CM

## 2012-06-24 NOTE — Patient Instructions (Addendum)
Great to see you. Have a wonderful Christmas with your family in [place]. We will call you with your lab results.

## 2012-06-24 NOTE — Addendum Note (Signed)
Addended by: Eliezer Bottom on: 06/24/2012 08:45 AM   Modules accepted: Orders

## 2012-06-24 NOTE — Progress Notes (Signed)
Subjective:    Patient ID: Nancy Owen, female    DOB: Nov 17, 1985, 26 y.o.   MRN: [id number]  HPI 41 G1P0 here for CPX.   H/o STDs- Denies any vaginal discharge or dysuria. Treated for syphilis and gonorrhea in January.  Would like to be retested today.  She is sexually active and does use condoms.  No h/o abnormal pap smears.  Cannot remember when she had her last pap smear. No family h/o breast, uterine, or cervical CA.  Patient Active Problem List  Diagnosis  . OSA (obstructive sleep apnea)  . Insomnia  . Screening for STD (sexually transmitted disease)  . Obesity  . Syphilis  . Positive pregnancy test  . Routine general medical examination at a health care facility   Past Medical History  Diagnosis Date  . History of syphilis   . Obesity   . Allergic rhinitis    Past Surgical History  Procedure Date  . Fracture surgery age 89    L left   History  Substance Use Topics  . Smoking status: Never Smoker   . Smokeless tobacco: Not on file  . Alcohol Use: Yes     Comment: not much now, just at holidays wine cooler   No family history on file. No Known Allergies Current Outpatient Prescriptions on File Prior to Visit  Medication Sig Dispense Refill  . Norethin-Eth Estradiol-Fe [facility] FE) 0.4-35 MG-MCG tablet Chew 1 tablet by mouth daily.      . Prenatal Vit-Fe Fumarate-FA (PRENATABS FA) TABS 1 tab po daily.  30 each  11   The PMH, PSH, Social History, Family History, Medications, and allergies have been reviewed in [facility], and have been updated if relevant.    Review of Systems    See HPI Patient reports no   fever ,adenopathy, persistant / recurrent hoarseness, swallowing issues, chest pain, edema,persistant / recurrent cough, hemoptysis, dyspnea(rest, exertional, paroxysmal nocturnal), gastrointestinal  bleeding (melena, rectal bleeding), abdominal pain, excessive heart burn, GU symptoms(dysuria, hematuria, pyuria, voiding/incontinence  Issues) syncope,  focal weakness, severe memory loss, concerning skin lesions, abnormal bruising/bleeding, major joint swelling, breast masses or abnormal vaginal bleeding.    Objective:   Physical Exam BP 100/64  Pulse 76  Temp 98.1 F (36.7 C)  Ht 5\' 1"  (1.549 m)  Wt 232 lb (105.235 kg)  BMI 43.84 kg/m2   General:  Overweight ,well-nourished,in no acute distress; alert,appropriate and cooperative throughout examination Head:  normocephalic and atraumatic.   Eyes:  vision grossly intact, pupils equal, pupils round, and pupils reactive to light.   Ears:  R ear normal and L ear normal.   Nose:  no external deformity.   Mouth:  good dentition.   Neck:  No deformities, masses, or tenderness noted. Breasts:  No mass, nodules, thickening, tenderness, bulging, retraction, inflamation, nipple discharge or skin changes noted.   Lungs:  Normal respiratory effort, chest expands symmetrically. Lungs are clear to auscultation, no crackles or wheezes. Heart:  Normal rate and regular rhythm. S1 and S2 normal without gallop, murmur, click, rub or other extra sounds. Abdomen:  Bowel sounds positive,abdomen soft and non-tender without masses, organomegaly or hernias noted. Rectal:  no external abnormalities.   Genitalia:  Pelvic Exam:        External: normal female genitalia without lesions or masses        Vagina: normal without lesions or masses        Cervix: normal without lesions or masses        Adnexa:  normal bimanual exam without masses or fullness        Uterus: normal by palpation        Pap smear: performed Msk:  No deformity or scoliosis noted of thoracic or lumbar spine.   Extremities:  No clubbing, cyanosis, edema, or deformity noted with normal full range of motion of all joints.   Neurologic:  alert & oriented X3 and gait normal.   Skin:  Intact without suspicious lesions or rashes Cervical Nodes:  No lymphadenopathy noted Axillary Nodes:  No palpable lymphadenopathy Psych:  Cognition and judgment  appear intact. Alert and cooperative with normal attention span and concentration. No apparent delusions, illusions, hallucinations     Assessment & Plan:   1. Routine general medical examination at a health care facility  Reviewed preventive care protocols, scheduled due services, and updated immunizations Discussed nutrition, exercise, diet, and healthy lifestyle.  Comprehensive metabolic panel  2. Screening for STD (sexually transmitted disease)   Also discussed sexual activity, pregnancy risk, and STD risk.  Encouraged to get regular exercise.  HIV Antibody, RPR, Rfx Qn RPR/Confirm TP [[facility]]  3. History of syphilis  RPR, Rfx Qn RPR/Confirm TP [[facility]]  4. Screening for ischemic heart disease  Lipid Panel

## 2012-06-26 LAB — COMPREHENSIVE METABOLIC PANEL
Albumin/Globulin Ratio: 1.3 (ref 1.1–2.5)
Albumin: 3.9 g/dL (ref 3.5–5.5)
Alkaline Phosphatase: 83 IU/L (ref 39–117)
BUN/Creatinine Ratio: 14 (ref 8–20)
BUN: 12 mg/dL (ref 6–20)
Chloride: 103 mmol/L (ref 97–108)
GFR calc Af Amer: 113 mL/min/{1.73_m2} (ref >59)
GFR calc non Af Amer: 98 mL/min/{1.73_m2} (ref >59)
Globulin, Total: 3.1 g/dL (ref 1.5–4.5)
Glucose: 79 mg/dL (ref 65–99)
Total Bilirubin: 0.2 mg/dL (ref 0.0–1.2)
Total Protein: 7 g/dL (ref 6.0–8.5)

## 2012-06-26 LAB — LIPID PANEL
Chol/HDL Ratio: 4.7 ratio units — ABNORMAL HIGH (ref 0.0–4.4)
LDL Calculated: 109 mg/dL (ref 0–119)
VLDL Cholesterol Cal: 17 mg/dL (ref 5–40)

## 2012-06-26 LAB — HIV ANTIBODY (ROUTINE TESTING W REFLEX)
HIV 1/O/2 Abs-Index Value: <1 (ref <1.00)
HIV-1/HIV-2 Ab: NONREACTIVE

## 2012-06-26 LAB — RPR QUALITATIVE: RPR: REACTIVE — AB

## 2012-06-29 ENCOUNTER — Encounter: Payer: Self-pay | Admitting: Family Medicine

## 2012-06-29 ENCOUNTER — Other Ambulatory Visit: Payer: Self-pay | Admitting: Family Medicine

## 2012-06-29 DIAGNOSIS — A539 Syphilis, unspecified: Secondary | ICD-10-CM

## 2012-06-29 LAB — HM PAP SMEAR: HM Pap smear: NORMAL

## 2012-08-12 NOTE — Progress Notes (Signed)
This encounter was used for a nurse injection visit only.

## 2012-12-15 ENCOUNTER — Ambulatory Visit (INDEPENDENT_AMBULATORY_CARE_PROVIDER_SITE_OTHER): Payer: Managed Care, Other (non HMO) | Admitting: *Deleted

## 2012-12-15 DIAGNOSIS — Z111 Encounter for screening for respiratory tuberculosis: Secondary | ICD-10-CM

## 2012-12-17 LAB — TB SKIN TEST
Induration: 0 mm
TB Skin Test: NEGATIVE

## 2013-02-08 ENCOUNTER — Ambulatory Visit (INDEPENDENT_AMBULATORY_CARE_PROVIDER_SITE_OTHER): Payer: Managed Care, Other (non HMO) | Admitting: Family Medicine

## 2013-02-08 ENCOUNTER — Encounter: Payer: Self-pay | Admitting: Family Medicine

## 2013-02-08 VITALS — BP 110/90 | HR 100 | Temp 97.9°F | Ht 61.0 in | Wt 232.0 lb

## 2013-02-08 DIAGNOSIS — R310 Gross hematuria: Secondary | ICD-10-CM

## 2013-02-08 DIAGNOSIS — J069 Acute upper respiratory infection, unspecified: Secondary | ICD-10-CM

## 2013-02-08 DIAGNOSIS — R509 Fever, unspecified: Secondary | ICD-10-CM

## 2013-02-08 LAB — POCT URINALYSIS DIPSTICK
Ketones, UA: NEGATIVE
Spec Grav, UA: 1.025
Urobilinogen, UA: NEGATIVE
pH, UA: 6

## 2013-02-08 MED ORDER — PENICILLIN V POTASSIUM 500 MG PO TABS: 500.0000 mg | ORAL_TABLET | Freq: Three times a day (TID) | ORAL | Status: AC

## 2013-02-08 NOTE — Progress Notes (Signed)
  Subjective:    Patient ID: Nancy Owen, female    DOB: 02-Nov-1985, 27 y.o.   MRN: [id number]  HPI 27 yo here with cough, diarrhea and fever.  Per pt, initially told us her mom told her fever was 105 yesterday.  She called her during office visit and her mother stated it was 100.5 Cough has been intermittent for one month but worse past few days. + sore throat  Had diarrhea all weekend, again this am but it is improving. No blood or mucous in her stools.  Denies lower abdominal pain but is a little TTP over suprapubic area on exam.  No dysuria or back pain.  Patient Active Problem List   Diagnosis Date Noted  . Fever, unspecified 02/08/2013  . Acute upper respiratory infections of unspecified site 02/08/2013  . Routine general medical examination at a health care facility 06/24/2012  . History of syphilis 06/24/2012  . Positive pregnancy test 01/23/2012  . Syphilis 08/05/2011  . OSA (obstructive sleep apnea) 08/02/2011  . Insomnia 08/02/2011  . Screening for STD (sexually transmitted disease) 08/02/2011  . Obesity 08/02/2011   Past Medical History  Diagnosis Date  . History of syphilis   . Obesity   . Allergic rhinitis    Past Surgical History  Procedure Laterality Date  . Fracture surgery  age 27    L left   History  Substance Use Topics  . Smoking status: Never Smoker   . Smokeless tobacco: Not on file  . Alcohol Use: Yes     Comment: not much now, just at holidays wine cooler   No family history on file. No Known Allergies No current outpatient prescriptions on file prior to visit.   No current facility-administered medications on file prior to visit.   The PMH, PSH, Social History, Family History, Medications, and allergies have been reviewed in [facility], and have been updated if relevant.    Review of Systems See HPI    Objective:   Physical Exam BP 110/90  Pulse 100  Temp(Src) 97.9 F (36.6 C)  Ht 5\' 1"  (1.549 m)  Wt 232 lb (105.235 kg)  BMI  43.86 kg/m2  SpO2 98% Gen:  Alert, pleasant, dry/hacky cough, NAD HEENT:  Mild erythema with enlarged tonsils. Resp:  CTA bilaterally CVS:  RRR Abd:   Mild suprapubic tenderness, no rebound, guarding, or CVA tenderness Ext:  No edema     Assessment & Plan:  1. Fever, unspecified New- rapid strep positive.  Treat for strep pharyngitis with PCN 500 mg three times daily x 10 days. UA - 2+ blood, 1+ LE, will send for cx.  - POCT rapid strep A - Culture, Group A Strep - POCT urinalysis dipstick  2. Acute upper respiratory infections of unspecified site See above.

## 2013-02-08 NOTE — Addendum Note (Signed)
Addended by: Eliezer Bottom on: 02/08/2013 10:12 AM   Modules accepted: Orders

## 2013-02-08 NOTE — Patient Instructions (Addendum)
Good to see you. Please take penicillin as directed- 1 tablet three times daily for 10 days.  Ibuprofen as needed for pain/fever (no more than 800 mg three times daily), with food.  Strep Throat Strep throat is an infection of the throat caused by a bacteria named Streptococcus pyogenes. Your caregiver may call the infection streptococcal "tonsillitis" or "pharyngitis" depending on whether there are signs of inflammation in the tonsils or back of the throat. Strep throat is most common in children aged 5 15 years during the cold months of the year, but it can occur in people of any age during any season. This infection is spread from person to person (contagious) through coughing, sneezing, or other close contact. SYMPTOMS   Fever or chills.  Painful, swollen, red tonsils or throat.  Pain or difficulty when swallowing.  White or yellow spots on the tonsils or throat.  Swollen, tender lymph nodes or "glands" of the neck or under the jaw.  Red rash all over the body (rare). DIAGNOSIS  Many different infections can cause the same symptoms. A test must be done to confirm the diagnosis so the right treatment can be given. A "rapid strep test" can help your caregiver make the diagnosis in a few minutes. If this test is not available, a light swab of the infected area can be used for a throat culture test. If a throat culture test is done, results are usually available in a day or two. TREATMENT  Strep throat is treated with antibiotic medicine. HOME CARE INSTRUCTIONS   Gargle with 1 tsp of salt in 1 cup of warm water, 3 4 times per day or as needed for comfort.  Family members who also have a sore throat or fever should be tested for strep throat and treated with antibiotics if they have the strep infection.  Make sure everyone in your household washes their hands well.  Do not share food, drinking cups, or personal items that could cause the infection to spread to others.  You may need  to eat a soft food diet until your sore throat gets better.  Drink enough water and fluids to keep your urine clear or pale yellow. This will help prevent dehydration.  Get plenty of rest.  Stay home from school, daycare, or work until you have been on antibiotics for 24 hours.  Only take over-the-counter or prescription medicines for pain, discomfort, or fever as directed by your caregiver.  If antibiotics are prescribed, take them as directed. Finish them even if you start to feel better. SEEK MEDICAL CARE IF:   The glands in your neck continue to enlarge.  You develop a rash, cough, or earache.  You cough up green, yellow-brown, or bloody sputum.  You have pain or discomfort not controlled by medicines.  Your problems seem to be getting worse rather than better. SEEK IMMEDIATE MEDICAL CARE IF:   You develop any new symptoms such as vomiting, severe headache, stiff or painful neck, chest pain, shortness of breath, or trouble swallowing.  You develop severe throat pain, drooling, or changes in your voice.  You develop swelling of the neck, or the skin on the neck becomes red and tender.  You have a fever.  You develop signs of dehydration, such as fatigue, dry mouth, and decreased urination.  You become increasingly sleepy, or you cannot wake up completely. Document Released: 06/28/2000 Document Revised: 06/17/2012 Document Reviewed: 08/30/2010 [facility] Patient Information 2014 [place], [place].

## 2013-02-09 LAB — URINE CULTURE

## 2013-05-20 ENCOUNTER — Other Ambulatory Visit: Payer: Self-pay

## 2013-09-16 ENCOUNTER — Ambulatory Visit (INDEPENDENT_AMBULATORY_CARE_PROVIDER_SITE_OTHER): Payer: Managed Care, Other (non HMO) | Admitting: Internal Medicine

## 2013-09-16 ENCOUNTER — Encounter: Payer: Self-pay | Admitting: Internal Medicine

## 2013-09-16 VITALS — BP 118/76 | HR 102 | Temp 97.9°F | Wt 244.5 lb

## 2013-09-16 DIAGNOSIS — B9689 Other specified bacterial agents as the cause of diseases classified elsewhere: Secondary | ICD-10-CM

## 2013-09-16 DIAGNOSIS — A499 Bacterial infection, unspecified: Secondary | ICD-10-CM

## 2013-09-16 DIAGNOSIS — B373 Candidiasis of vulva and vagina: Secondary | ICD-10-CM

## 2013-09-16 DIAGNOSIS — B3731 Acute candidiasis of vulva and vagina: Secondary | ICD-10-CM

## 2013-09-16 DIAGNOSIS — R300 Dysuria: Secondary | ICD-10-CM

## 2013-09-16 DIAGNOSIS — N760 Acute vaginitis: Secondary | ICD-10-CM

## 2013-09-16 MED ORDER — MICONAZOLE NITRATE 100 MG VA SUPP: 100.0000 mg | Freq: Every day | VAGINAL | Status: DC

## 2013-09-16 MED ORDER — METRONIDAZOLE 0.75 % VA GEL: 1.0000 | Freq: Two times a day (BID) | VAGINAL | Status: DC

## 2013-09-16 NOTE — Patient Instructions (Addendum)
Candida Infection, Adult A candida infection (also called yeast, fungus and Monilia infection) is an overgrowth of yeast that can occur anywhere on the body. A yeast infection commonly occurs in warm, moist body areas. Usually, the infection remains localized but can spread to become a systemic infection. A yeast infection may be a sign of a more severe disease such as diabetes, leukemia, or AIDS. A yeast infection can occur in both men and women. In women, Candida vaginitis is a vaginal infection. It is one of the most common causes of vaginitis. Men usually do not have symptoms or know they have an infection until other problems develop. Men may find out they have a yeast infection because their sex partner has a yeast infection. Uncircumcised men are more likely to get a yeast infection than circumcised men. This is because the uncircumcised glans is not exposed to air and does not remain as dry as that of a circumcised glans. Older adults may develop yeast infections around dentures. CAUSES  Women  Antibiotics.  Steroid medication taken for a long time.  Being overweight (obese).  Diabetes.  Poor immune condition.  Certain serious medical conditions.  Immune suppressive medications for organ transplant patients.  Chemotherapy.  Pregnancy.  Menstration.  Stress and fatigue.  Intravenous drug use.  Oral contraceptives.  Wearing tight-fitting clothes in the crotch area.  Catching it from a sex partner who has a yeast infection.  Spermicide.  Intravenous, urinary, or other catheters. Men  Catching it from a sex partner who has a yeast infection.  Having oral or anal sex with a person who has the infection.  Spermicide.  Diabetes.  Antibiotics.  Poor immune system.  Medications that suppress the immune system.  Intravenous drug use.  Intravenous, urinary, or other catheters. SYMPTOMS  Women  Thick, white vaginal discharge.  Vaginal itching.  Redness and  swelling in and around the vagina.  Irritation of the lips of the vagina and perineum.  Blisters on the vaginal lips and perineum.  Painful sexual intercourse.  Low blood sugar (hypoglycemia).  Painful urination.  Bladder infections.  Intestinal problems such as constipation, indigestion, bad breath, bloating, increase in gas, diarrhea, or loose stools. Men  Men may develop intestinal problems such as constipation, indigestion, bad breath, bloating, increase in gas, diarrhea, or loose stools.  Dry, cracked skin on the penis with itching or discomfort.  Jock itch.  Dry, flaky skin.  Athlete's foot.  Hypoglycemia. DIAGNOSIS  Women  A history and an exam are performed.  The discharge may be examined under a microscope.  A culture may be taken of the discharge. Men  A history and an exam are performed.  Any discharge from the penis or areas of cracked skin will be looked at under the microscope and cultured.  Stool samples may be cultured. TREATMENT  Women  Vaginal antifungal suppositories and creams.  Medicated creams to decrease irritation and itching on the outside of the vagina.  Warm compresses to the perineal area to decrease swelling and discomfort.  Oral antifungal medications.  Medicated vaginal suppositories or cream for repeated or recurrent infections.  Wash and dry the irritation areas before applying the cream.  Eating yogurt with lactobacillus may help with prevention and treatment.  Sometimes painting the vagina with gentian violet solution may help if creams and suppositories do not work. Men  Antifungal creams and oral antifungal medications.  Sometimes treatment must continue for 30 days after the symptoms go away to prevent recurrence. HOME CARE  INSTRUCTIONS  Women  Use cotton underwear and avoid tight-fitting clothing.  Avoid colored, scented toilet paper and deodorant tampons or pads.  Do not douche.  Keep your diabetes  under control.  Finish all the prescribed medications.  Keep your skin clean and dry.  Consume milk or yogurt with lactobacillus active culture regularly. If you get frequent yeast infections and think that is what the infection is, there are over-the-counter medications that you can get. If the infection does not show healing in 3 days, talk to your caregiver.  Tell your sex partner you have a yeast infection. Your partner may need treatment also, especially if your infection does not clear up or recurs. Men  Keep your skin clean and dry.  Keep your diabetes under control.  Finish all prescribed medications.  Tell your sex partner that you have a yeast infection so they can be treated if necessary. SEEK MEDICAL CARE IF:   Your symptoms do not clear up or worsen in one week after treatment.  You have an oral temperature above 102 F (38.9 C).  You have trouble swallowing or eating for a prolonged time.  You develop blisters on and around your vagina.  You develop vaginal bleeding and it is not your menstrual period.  You develop abdominal pain.  You develop intestinal problems as mentioned above.  You get weak or lightheaded.  You have painful or increased urination.  You have pain during sexual intercourse. MAKE SURE YOU:   Understand these instructions.  Will watch your condition.  Will get help right away if you are not doing well or get worse. Document Released: 08/08/2004 Document Revised: 09/23/2011 Document Reviewed: 11/20/2009 [facility] Patient Information 2014 [place].

## 2013-09-16 NOTE — Progress Notes (Signed)
HPI  Pt presents to the clinic today with c/o vaginal discharge and odor. She reports this started about  4 days ago. She reports the vaginal discharge is thin and white. She does c/o vaginal itching and dysuria as well. She has not tried anything OTC. She is not currently sexually active but she is pregnant, due in July.   Review of Systems  Past Medical History  Diagnosis Date  . History of syphilis   . Obesity   . Allergic rhinitis     History reviewed. No pertinent family history.  History   Social History  . Marital Status: Single    Spouse Name: N/A    Number of Children: 0  . Years of Education: N/A   Occupational History  . AR specialist- [employer]   Social History Main Topics  . Smoking status: Never Smoker   . Smokeless tobacco: Not on file  . Alcohol Use: Yes     Comment: not much now, just at holidays wine cooler  . Drug Use: Not on file  . Sexual Activity: Not on file   Other Topics Concern  . Not on file   Social History Narrative   Lives with mother, brother, and step father    No Known Allergies  Constitutional: Denies fever, malaise, fatigue, headache or abrupt weight changes.   GU: Pt reports vaginal discharge, odor and pain with urination. Denies burning sensation, blood in urine. Skin: Denies redness, rashes, lesions or ulcercations.   No other specific complaints in a complete review of systems (except as listed in HPI above).    Objective:   Physical Exam  BP 118/76  Pulse 102  Temp(Src) 97.9 F (36.6 C) (Oral)  Wt 244 lb 8 oz (110.904 kg)  SpO2 98% Wt Readings from Last 3 Encounters:  09/16/13 244 lb 8 oz (110.904 kg)  02/08/13 232 lb (105.235 kg)  06/24/12 232 lb (105.235 kg)    General: Appears her stated age, obese but well developed, well nourished in NAD. Cardiovascular: Normal rate and rhythm. S1,S2 noted.  No murmur, rubs or gallops noted. No JVD or BLE edema. No carotid bruits noted. Pulmonary/Chest: Normal  effort and positive vesicular breath sounds. No respiratory distress. No wheezes, rales or ronchi noted.  Abdomen: Soft and nontender. Normal bowel sounds, no bruits noted. No distention or masses noted. Liver, spleen and kidneys non palpable. Tender to palpation over the bladder area. No CVA tenderness.      Assessment & Plan:   Dysuria, vaginal discharge and odor secondary to BV and yeast:  Urinalysis: Mod leuks, trace blood Wet prep: small yeast, moderate clue cells, negative trcih eRx for Miconazole 100 mg vaginally x 7 days eRx for Metrogel BID x 5 days Drink plenty of fluids   RTC as needed or if symptoms persist.

## 2013-09-16 NOTE — Progress Notes (Signed)
Pre visit review using our clinic review tool, if applicable. No additional management support is needed unless otherwise documented below in the visit note. 

## 2013-10-04 ENCOUNTER — Encounter: Payer: Self-pay | Admitting: Maternal and Fetal Medicine

## 2013-10-11 ENCOUNTER — Ambulatory Visit: Payer: Self-pay | Admitting: Maternal and Fetal Medicine

## 2013-11-11 ENCOUNTER — Telehealth: Payer: Self-pay

## 2013-11-11 NOTE — Telephone Encounter (Signed)
Rosendo Gros pts mother said pt had sleep study done [facility] sleep lab on 10/14/13. Rosendo Gros said [facility] was to send sleep study results. Spoke with Kennyth Lose at [facility] sleep study [phone]; Kennyth Lose said sleep study was faxed to Dr Deborra Medina on 11/09/13; confirmation was received by [facility] that fax went thru. pts mother will wait for cb with results.

## 2013-11-12 NOTE — Telephone Encounter (Signed)
I know she said they received a confirmation, but this is not in my box on my desk.  Please ask them to resend.  Thanks.

## 2013-11-12 NOTE — Telephone Encounter (Signed)
Will review once I get to office this afternoon.

## 2013-11-12 NOTE — Telephone Encounter (Signed)
Spoke to [place] who states that she will send to direct fax

## 2013-11-15 NOTE — Telephone Encounter (Signed)
Thank you.  I just reviewed the results.  She does have sleep apnea and and CPAP was recommended.   Was this already ordered for her?

## 2013-11-15 NOTE — Telephone Encounter (Signed)
Left message on voice mail  to call back

## 2013-11-16 NOTE — Telephone Encounter (Signed)
Spoke to patient's mom and was advised that patient has been using a CPAP for 1-2 years. Patient's mom stated that they now have new equipment and patient would like to switch from the mask to the nasal equipment. Patient stated that this was ordinally ordered by Dr. Deborra Medina and thinks that it may have been ordered thru [place]?

## 2013-11-16 NOTE — Telephone Encounter (Signed)
No I do not order CPAP supplies unfortunately.  A pulmonologist or neurologist needs to do this.  Dr. Manuella Ghazi can do this for her.

## 2013-11-16 NOTE — Telephone Encounter (Signed)
Patient's mom notified as instructed by telephone.

## 2013-11-17 ENCOUNTER — Telehealth: Payer: Self-pay | Admitting: Pulmonary Disease

## 2013-11-17 NOTE — Telephone Encounter (Signed)
Per OV w/ [place] 09/27/11: Patient Instructions     Will set up on cpap. Please call if having tolerance issues.  Work weight reduction  followup with me in 6 weeks  --   Called spoke with mother. She scheduled pt appt to come in and see [facility] 12/10/13 at 4 PM. Nothing further needed

## 2013-11-29 ENCOUNTER — Encounter: Payer: Self-pay | Admitting: Maternal & Fetal Medicine

## 2013-12-10 ENCOUNTER — Ambulatory Visit (INDEPENDENT_AMBULATORY_CARE_PROVIDER_SITE_OTHER): Payer: Managed Care, Other (non HMO) | Admitting: Pulmonary Disease

## 2013-12-10 ENCOUNTER — Encounter: Payer: Self-pay | Admitting: Pulmonary Disease

## 2013-12-10 VITALS — BP 118/78 | HR 108 | Temp 98.1°F | Ht 61.0 in | Wt 258.2 lb

## 2013-12-10 DIAGNOSIS — G4733 Obstructive sleep apnea (adult) (pediatric): Secondary | ICD-10-CM

## 2013-12-10 NOTE — Progress Notes (Signed)
   Subjective:    Patient ID: Nancy Owen, female    DOB: 1986-02-25, 28 y.o.   MRN: [id number]  HPI Patient comes in today for followup of her obstructive sleep apnea. She was started on CPAP at the last visit, and has not been seen in 2 years. She never had her pressure optimize, but did have a titration study a few months ago that showed optimal pressure of 16 cm. She has worn CPAP since the last visit according to her history, but she is willing to change to nasal pillows.   Review of Systems  Constitutional: Negative for fever and unexpected weight change.  HENT: Negative for congestion, dental problem, ear pain, nosebleeds, postnasal drip, rhinorrhea, sinus pressure, sneezing, sore throat and trouble swallowing.   Eyes: Negative for redness and itching.  Respiratory: Negative for cough, chest tightness, shortness of breath and wheezing.   Cardiovascular: Negative for palpitations and leg swelling.  Gastrointestinal: Negative for nausea and vomiting.  Genitourinary: Negative for dysuria.  Musculoskeletal: Negative for joint swelling.  Skin: Negative for rash.  Neurological: Negative for headaches.  Hematological: Does not bruise/bleed easily.  Psychiatric/Behavioral: Negative for dysphoric mood. The patient is not nervous/anxious.        Objective:   Physical Exam Morbidly obese female in no acute distress Nose without purulence or discharge noted Neck without lymphadenopathy or thyromegaly No skin breakdown or pressure necrosis from the CPAP mask Lower extremities with mild edema, no cyanosis Alert and oriented, moves all 4 extremities.       Assessment & Plan:

## 2013-12-10 NOTE — Patient Instructions (Signed)
Will order nasal pillows for you, and add a chin strap. Will set your cpap pressure to 16cm, per your recent sleep study. Work on weight loss followup with me again in one year, but call if having issues with tolerance.

## 2013-12-10 NOTE — Assessment & Plan Note (Signed)
The patient has a history of operated obstructive sleep apnea, but has not followed up with me since the original consult 2 years ago. She is had a recent CPAP titration study which showed her optimal pressure to be 16 cm of water. She would also like to change to nasal pillows, and we will arrange for this and add a chin strap. I've asked the patient to keep up with her mask changes and supplies, and to work aggressively on weight loss.

## 2014-01-14 ENCOUNTER — Inpatient Hospital Stay: Payer: Self-pay

## 2014-01-14 LAB — CBC WITH DIFFERENTIAL/PLATELET
Basophil #: 0.1 10*3/uL (ref 0.0–0.1)
Basophil %: 0.8 %
Eosinophil #: 0.2 10*3/uL (ref 0.0–0.7)
Eosinophil %: 1.1 %
HCT: 31.7 % — AB (ref 35.0–47.0)
HGB: 10.3 g/dL — AB (ref 12.0–16.0)
LYMPHS ABS: 2.8 10*3/uL (ref 1.0–3.6)
LYMPHS PCT: 18.9 %
MCH: 27.4 pg (ref 26.0–34.0)
MCHC: 32.4 g/dL (ref 32.0–36.0)
MCV: 85 fL (ref 80–100)
MONOS PCT: 7.1 %
Monocyte #: 1.1 x10 3/mm — ABNORMAL HIGH (ref 0.2–0.9)
NEUTROS PCT: 72.1 %
Neutrophil #: 10.8 10*3/uL — ABNORMAL HIGH (ref 1.4–6.5)
Platelet: 222 10*3/uL (ref 150–440)
RBC: 3.75 10*6/uL — ABNORMAL LOW (ref 3.80–5.20)
RDW: 18.2 % — ABNORMAL HIGH (ref 11.5–14.5)
WBC: 14.9 10*3/uL — ABNORMAL HIGH (ref 3.6–11.0)

## 2014-01-17 DIAGNOSIS — O0990 Supervision of high risk pregnancy, unspecified, unspecified trimester: Secondary | ICD-10-CM

## 2014-01-17 HISTORY — DX: Supervision of high risk pregnancy, unspecified, unspecified trimester: O09.90

## 2014-01-18 LAB — GC/CHLAMYDIA PROBE AMP

## 2014-01-18 LAB — HEMATOCRIT: HCT: 27.2 % — ABNORMAL LOW (ref 35.0–47.0)

## 2014-01-20 LAB — PATHOLOGY REPORT

## 2014-02-22 LAB — HM PAP SMEAR: HM Pap smear: NEGATIVE

## 2014-05-16 ENCOUNTER — Encounter: Payer: Self-pay | Admitting: Pulmonary Disease

## 2014-11-05 NOTE — Consult Note (Signed)
Referral Information:  Reason for Referral 1) Obesity; 2) Sleep Apnea; 3) Persistent positive RPR titers   Referring Physician [facility]   Prenatal Hx 29 year old G77P0010 with [place] of 01/13/2014 (patient at 25 4/7 weeks today). Of note, the patient did not report another pregnancy to me.  Obesity and Sleep Apnea The patient reports an extended history of obesity. She reports no medical complications of this so far. The patient was diagnosed with sleep apnea in 2013 after her family complained of loud snoring. She underwent a sleep study and was fitted with CPAP. The patient reports using her CPAP device. The patient states that since then, she has had no other sleep studies  Syphilis RPR titer 05/26/2013 = 1:4; 06/26/2013 = 1:2; 09/20/2013 = 1:4  The patient reports that she was first diagnosed in 2011. She states that this was part of a routine check up. She states that she does not know if her partner then was treated. She states that she had no further contact with him. Her treatment then consisted of 3 injections of penicillin.  In 2013, the patient was retreated. This was after a routine check up. Because of her history RPR titers were drawn and were positive. The patient states that because her provider could document her earlier treatment, she was retreated with 3 injections of penicillin.  The patient admits to having a history of gonorrhea, chlamydia. She also has history of HSV.   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    Total Time Spent with Patient 30 minutes   >50% of visit spent in couseling/coordination of care yes   Office Use Only 99242  Level 2 (80min) NEW office consult exp prob focused   Coding Description: OTHER: 1) Obesity; 2) Sleep Apnea; 3) Persistent Positive RPR.  Electronic Signatures: Sande Rives (MD)  (Signed 23-Mar-15 10:09)  Authored: Referral, Impression, Billing, Coding Description   Last Updated: 23-Mar-15 10:09 by Sande Rives (MD)

## 2014-11-22 NOTE — H&P (Signed)
L&D Evaluation:  History:  HPI 29 yo G2P0010 at [redacted]w[redacted]d gestation by D=12wk [place] derived EDC of 01/23/2014 presenting for IOL secondary to morbid obesity compounded by sleep apnea.  +FM, no LOF, no VB, no ctx   Presents with IOL   Patient's Medical History Obesity, sleep apnea, history of syphyllis, HSV (positive titers no prior outbreak)   Patient's Surgical History foot surgery   Medications Pre Natal Vitamins   Allergies NKDA   Social History none   Family History Non-Contributory   ROS:  ROS All systems were reviewed.  HEENT, CNS, GI, GU, Respiratory, CV, Renal and Musculoskeletal systems were found to be normal.   Exam:  Vital Signs stable   Urine Protein not completed   General no apparent distress   Mental Status clear   Chest clear   Heart normal sinus rhythm   Abdomen gravid, non-tender   Estimated Fetal Weight Average for gestational age   Fetal Position vtx   Back no CVAT   Edema 1+   Pelvic no external lesions, cervix closed and thick   Mebranes Intact   FHT normal rate with no decels   Ucx absent   Impression:  Impression 29 yo G2P0010 at [redacted]w[redacted]d presenting for IOL   Plan:  Plan EFM/NST, monitor contractions and for cervical change, antibiotics for GBBS prophylaxis   Comments 1) IOL - cervidil tonight  2) Fetus - reactive NST, category I tracing  3) PNL O pos / ABSC neg / RI / VZI / HBsAg neg / HIV neg / RPR (1:4 last check 10/18/13) / 1st Trimester Screen negative / 1-hr OGTT 79/ GBS positive 12/15/13     - ampicillin intrapartum  4) Positive RPR - treated twice previously, per ID no further follow up warranted if titers stay below 1:4, if rise in titers consider lumbar puncture to evalute for neuro syphyllis  5) Sleep apnea - will use home CPAP machine  6) Disposition - pending delivery   Electronic Signatures: Dorthula Nettles (MD)  (Signed 03-Jul-15 21:52)  Authored: L&D Evaluation   Last Updated: 03-Jul-15 21:52 by Dorthula Nettles (MD)

## 2014-12-13 ENCOUNTER — Ambulatory Visit: Payer: Managed Care, Other (non HMO) | Admitting: Pulmonary Disease

## 2014-12-19 ENCOUNTER — Ambulatory Visit (INDEPENDENT_AMBULATORY_CARE_PROVIDER_SITE_OTHER): Payer: Managed Care, Other (non HMO) | Admitting: Family Medicine

## 2014-12-19 ENCOUNTER — Encounter: Payer: Self-pay | Admitting: Family Medicine

## 2014-12-19 VITALS — BP 126/88 | HR 83 | Temp 98.0°F | Ht 61.0 in | Wt 262.0 lb

## 2014-12-19 DIAGNOSIS — G4733 Obstructive sleep apnea (adult) (pediatric): Secondary | ICD-10-CM | POA: Diagnosis not present

## 2014-12-19 DIAGNOSIS — E669 Obesity, unspecified: Secondary | ICD-10-CM

## 2014-12-19 NOTE — Progress Notes (Signed)
   Subjective:   Patient ID: Nancy Owen, female    DOB: [birth year], 29 y.o.   MRN: [id number]  Nancy Owen is a pleasant 29 y.o. year old female pt with h/o OSA, who presents to clinic today with Weight Gain  on 12/19/2014  HPI: Obesity-  BMI now 49.5.  Prior to getting pregnant with her daughter, who is now almost a year old, she had lost 50 pounds with phentermine and exercise. She tried phentermine again and was not as effective. She wants to know what else she can try.  She is not breastfeeding.  She just started exercising again.  No current outpatient prescriptions on file prior to visit.   No current facility-administered medications on file prior to visit.    No Known Allergies  Past Medical History  Diagnosis Date  . History of syphilis   . Obesity   . Allergic rhinitis     Past Surgical History  Procedure Laterality Date  . Fracture surgery  age 58    L left    History reviewed. No pertinent family history.  History   Social History  . Marital Status: Single    Spouse Name: N/A  . Number of Children: 0  . Years of Education: N/A   Occupational History  . AR specialist- [employer]   Social History Main Topics  . Smoking status: Never Smoker   . Smokeless tobacco: Not on file  . Alcohol Use: Yes     Comment: not much now, just at holidays wine cooler  . Drug Use: Not on file  . Sexual Activity: Not on file   Other Topics Concern  . Not on file   Social History Narrative   Lives with mother, brother, and step father   The PMH, PSH, Social History, Family History, Medications, and allergies have been reviewed in [facility], and have been updated if relevant.   Review of Systems  Constitutional: Negative.   Respiratory: Negative.   Cardiovascular: Negative.   Gastrointestinal: Negative.   Genitourinary: Negative.   Psychiatric/Behavioral: Negative.   All other systems reviewed and are negative.      Objective:    BP 126/88  mmHg  Pulse 83  Temp(Src) 98 F (36.7 C) (Oral)  Ht 5\' 1"  (1.549 m)  Wt 262 lb (118.842 kg)  BMI 49.53 kg/m2  SpO2 98%  Breastfeeding? Unknown  Wt Readings from Last 3 Encounters:  12/19/14 262 lb (118.842 kg)  12/10/13 258 lb 3.2 oz (117.119 kg)  09/16/13 244 lb 8 oz (110.904 kg)     Physical Exam  Constitutional: She is oriented to person, place, and time. She appears well-developed and well-nourished. No distress.  HENT:  Head: Normocephalic.  Eyes: Conjunctivae are normal.  Cardiovascular: Normal rate.   Musculoskeletal: She exhibits no edema.  Neurological: She is alert and oriented to person, place, and time. No cranial nerve deficit.  Skin: Skin is warm and dry.  Psychiatric: She has a normal mood and affect. Her behavior is normal. Thought content normal.  Nursing note and vitals reviewed.         Assessment & Plan:   Obesity No Follow-up on file.

## 2014-12-19 NOTE — Assessment & Plan Note (Signed)
>  25 minutes spent in face to face time with patient, >50% spent in counselling or coordination of care She would like referral to bariatric surgeon to discuss options. Referral placed.

## 2014-12-19 NOTE — Patient Instructions (Signed)
Great to see you. Please stop by to see Linda on your way out. 

## 2014-12-19 NOTE — Progress Notes (Signed)
Pre visit review using our clinic review tool, if applicable. No additional management support is needed unless otherwise documented below in the visit note. 

## 2014-12-27 ENCOUNTER — Other Ambulatory Visit: Payer: Self-pay | Admitting: Family Medicine

## 2014-12-27 ENCOUNTER — Other Ambulatory Visit (INDEPENDENT_AMBULATORY_CARE_PROVIDER_SITE_OTHER): Payer: Managed Care, Other (non HMO)

## 2014-12-27 DIAGNOSIS — Z202 Contact with and (suspected) exposure to infections with a predominantly sexual mode of transmission: Secondary | ICD-10-CM

## 2014-12-27 DIAGNOSIS — A539 Syphilis, unspecified: Secondary | ICD-10-CM

## 2014-12-27 NOTE — Addendum Note (Signed)
Addended by: Ellamae Sia on: 12/27/2014 09:32 AM   Modules accepted: Orders

## 2014-12-29 ENCOUNTER — Telehealth: Payer: Self-pay | Admitting: Family Medicine

## 2014-12-29 LAB — HIV ANTIBODY (ROUTINE TESTING W REFLEX): HIV SCREEN 4TH GENERATION: NONREACTIVE

## 2014-12-29 LAB — RPR, QUANT+TP ABS (REFLEX)
Rapid Plasma Reagin, Quant: 1:4 {titer} — ABNORMAL HIGH
T Pallidum Abs: POSITIVE — AB

## 2014-12-29 LAB — RPR: RPR Ser Ql: REACTIVE — AB

## 2014-12-29 NOTE — Telephone Encounter (Signed)
Pt is returning your cal.. Please call [phone]

## 2014-12-30 NOTE — Telephone Encounter (Signed)
Lm on pts vm and informed her that I had spoken to her mother and relayed lab results. Advised pt to contact office back if she has any additional questions

## 2015-01-02 NOTE — Telephone Encounter (Signed)
Pt left v/m requesting cb [phone].

## 2015-01-02 NOTE — Telephone Encounter (Signed)
Spoke to pt who was wanting to schedule an OV for her husband.

## 2015-01-03 ENCOUNTER — Ambulatory Visit (INDEPENDENT_AMBULATORY_CARE_PROVIDER_SITE_OTHER): Payer: Managed Care, Other (non HMO) | Admitting: Family Medicine

## 2015-01-03 ENCOUNTER — Encounter: Payer: Self-pay | Admitting: Family Medicine

## 2015-01-03 ENCOUNTER — Other Ambulatory Visit (HOSPITAL_COMMUNITY)
Admission: RE | Admit: 2015-01-03 | Discharge: 2015-01-03 | Disposition: A | Discharge status: Home / Self Care | Payer: Managed Care, Other (non HMO) | Source: Ambulatory Visit | Attending: Family Medicine | Admitting: Family Medicine

## 2015-01-03 VITALS — BP 106/66 | HR 100 | Temp 98.3°F | Ht 61.5 in | Wt 263.8 lb

## 2015-01-03 DIAGNOSIS — Z113 Encounter for screening for infections with a predominantly sexual mode of transmission: Secondary | ICD-10-CM | POA: Diagnosis present

## 2015-01-03 DIAGNOSIS — Z1151 Encounter for screening for human papillomavirus (HPV): Secondary | ICD-10-CM | POA: Insufficient documentation

## 2015-01-03 DIAGNOSIS — Z01419 Encounter for gynecological examination (general) (routine) without abnormal findings: Secondary | ICD-10-CM | POA: Diagnosis present

## 2015-01-03 DIAGNOSIS — G4700 Insomnia, unspecified: Secondary | ICD-10-CM

## 2015-01-03 DIAGNOSIS — E669 Obesity, unspecified: Secondary | ICD-10-CM | POA: Diagnosis not present

## 2015-01-03 DIAGNOSIS — Z124 Encounter for screening for malignant neoplasm of cervix: Secondary | ICD-10-CM

## 2015-01-03 DIAGNOSIS — N760 Acute vaginitis: Secondary | ICD-10-CM | POA: Diagnosis present

## 2015-01-03 DIAGNOSIS — G4733 Obstructive sleep apnea (adult) (pediatric): Secondary | ICD-10-CM

## 2015-01-03 DIAGNOSIS — Z0000 Encounter for general adult medical examination without abnormal findings: Secondary | ICD-10-CM

## 2015-01-03 LAB — TSH: TSH: 0.56 u[IU]/mL (ref 0.35–4.50)

## 2015-01-03 LAB — COMPREHENSIVE METABOLIC PANEL
ALT: 18 U/L (ref 0–35)
AST: 13 U/L (ref 0–37)
Albumin: 3.9 g/dL (ref 3.5–5.2)
Alkaline Phosphatase: 76 U/L (ref 39–117)
BUN: 11 mg/dL (ref 6–23)
CALCIUM: 9.3 mg/dL (ref 8.4–10.5)
CO2: 28 mEq/L (ref 19–32)
CREATININE: 0.73 mg/dL (ref 0.40–1.20)
Chloride: 106 mEq/L (ref 96–112)
GFR: 123.37 mL/min (ref >60.00)
GLUCOSE: 90 mg/dL (ref 70–99)
POTASSIUM: 4 meq/L (ref 3.5–5.1)
Sodium: 138 mEq/L (ref 135–145)
Total Bilirubin: 0.3 mg/dL (ref 0.2–1.2)
Total Protein: 7.4 g/dL (ref 6.0–8.3)

## 2015-01-03 LAB — CBC WITH DIFFERENTIAL/PLATELET
BASOS PCT: 0.5 % (ref 0.0–3.0)
Basophils Absolute: 0.1 10*3/uL (ref 0.0–0.1)
EOS PCT: 1.4 % (ref 0.0–5.0)
Eosinophils Absolute: 0.1 10*3/uL (ref 0.0–0.7)
HCT: 36.9 % (ref 36.0–46.0)
Hemoglobin: 12.1 g/dL (ref 12.0–15.0)
LYMPHS PCT: 31.4 % (ref 12.0–46.0)
Lymphs Abs: 3.4 10*3/uL (ref 0.7–4.0)
MCHC: 32.9 g/dL (ref 30.0–36.0)
MCV: 84.5 fl (ref 78.0–100.0)
Monocytes Absolute: 0.9 10*3/uL (ref 0.1–1.0)
Monocytes Relative: 8.7 % (ref 3.0–12.0)
NEUTROS PCT: 58 % (ref 43.0–77.0)
Neutro Abs: 6.2 10*3/uL (ref 1.4–7.7)
Platelets: 280 10*3/uL (ref 150.0–400.0)
RBC: 4.36 Mil/uL (ref 3.87–5.11)
RDW: 14 % (ref 11.5–15.5)
WBC: 10.8 10*3/uL — ABNORMAL HIGH (ref 4.0–10.5)

## 2015-01-03 LAB — LIPID PANEL
CHOLESTEROL: 145 mg/dL (ref 0–200)
HDL: 35.2 mg/dL — AB (ref >39.00)
LDL CALC: 78 mg/dL (ref 0–99)
NonHDL: 109.8
Total CHOL/HDL Ratio: 4
Triglycerides: 158 mg/dL — ABNORMAL HIGH (ref 0.0–149.0)
VLDL: 31.6 mg/dL (ref 0.0–40.0)

## 2015-01-03 NOTE — Assessment & Plan Note (Signed)
Reviewed preventive care protocols, scheduled due services, and updated immunizations Discussed nutrition, exercise, diet, and healthy lifestyle.  Orders Placed This Encounter  Procedures  . CBC with Differential/Platelet  . Lipid panel  . Comprehensive metabolic panel  . TSH    Discussed USPSTF recommendations of cervical cancer screening.  She is aware that interval of 3 years is recommended but pt would prefer to have pap smear done today.

## 2015-01-03 NOTE — Progress Notes (Signed)
Subjective:   Patient ID: Nancy Owen, female    DOB: 06/08/1986, 29 y.o.   MRN: [id number]  Nancy Owen is a pleasant 29 y.o. year old female who presents to clinic today with Annual Exam  on 01/03/2015  HPI:  Doing well.  She has been seeing GYN- she cannot remember when last pap was- probably before her son was born who is now a year old.  She does want her pap done today.  H/o syphilis RPR positive but trending down.  Per ID, will likely remain positive for years.  Lab Results  Component Value Date   WBC 14.9* 01/14/2014   HGB 10.3* 01/14/2014   HCT 27.2* 01/18/2014   MCV 85 01/14/2014   PLT 222 01/14/2014   No results found for: TSH  No current outpatient prescriptions on file prior to visit.   No current facility-administered medications on file prior to visit.    No Known Allergies  Past Medical History  Diagnosis Date  . History of syphilis   . Obesity   . Allergic rhinitis     Past Surgical History  Procedure Laterality Date  . Fracture surgery  age 64    L left    No family history on file.  History   Social History  . Marital Status: Single    Spouse Name: N/A  . Number of Children: 0  . Years of Education: N/A   Occupational History  . AR specialist- [employer]   Social History Main Topics  . Smoking status: Never Smoker   . Smokeless tobacco: Not on file  . Alcohol Use: Yes     Comment: not much now, just at holidays wine cooler  . Drug Use: Not on file  . Sexual Activity: Not on file   Other Topics Concern  . Not on file   Social History Narrative   Lives with mother, brother, and step father   The PMH, PSH, Social History, Family History, Medications, and allergies have been reviewed in [facility], and have been updated if relevant.  Review of Systems  Constitutional: Negative.   HENT: Negative.   Eyes: Negative.   Respiratory: Negative.   Cardiovascular: Negative.   Gastrointestinal: Negative.   Endocrine:  Negative.   Genitourinary: Negative.   Musculoskeletal: Negative.   Skin: Negative.   Allergic/Immunologic: Negative.   Neurological: Negative.   Hematological: Negative.   Psychiatric/Behavioral: Negative.   All other systems reviewed and are negative.      Objective:    BP 106/66 mmHg  Pulse 100  Temp(Src) 98.3 F (36.8 C) (Oral)  Ht 5' 1.5" (1.562 m)  Wt 263 lb 12 oz (119.636 kg)  BMI 49.03 kg/m2  SpO2 97%  Wt Readings from Last 3 Encounters:  01/03/15 263 lb 12 oz (119.636 kg)  12/19/14 262 lb (118.842 kg)  12/10/13 258 lb 3.2 oz (117.119 kg)     Physical Exam    General:  Well-developed,well-nourished,in no acute distress; alert,appropriate and cooperative throughout examination Head:  normocephalic and atraumatic.   Eyes:  vision grossly intact, pupils equal, pupils round, and pupils reactive to light.   Ears:  R ear normal and L ear normal.   Nose:  no external deformity.   Mouth:  good dentition.   Neck:  No deformities, masses, or tenderness noted. Breasts:  No mass, nodules, thickening, tenderness, bulging, retraction, inflamation, nipple discharge or skin changes noted.   Lungs:  Normal respiratory effort, chest expands symmetrically. Lungs are clear to  auscultation, no crackles or wheezes. Heart:  Normal rate and regular rhythm. S1 and S2 normal without gallop, murmur, click, rub or other extra sounds. Abdomen:  Bowel sounds positive,abdomen soft and non-tender without masses, organomegaly or hernias noted. Rectal:  no external abnormalities.   Genitalia:  Pelvic Exam:        External: normal female genitalia without lesions or masses        Vagina: normal without lesions or masses        Cervix: normal without lesions or masses        Adnexa: normal bimanual exam without masses or fullness        Uterus: normal by palpation        Pap smear: performed Msk:  No deformity or scoliosis noted of thoracic or lumbar spine.   Extremities:  No clubbing,  cyanosis, edema, or deformity noted with normal full range of motion of all joints.   Neurologic:  alert & oriented X3 and gait normal.   Skin:  Intact without suspicious lesions or rashes Cervical Nodes:  No lymphadenopathy noted Axillary Nodes:  No palpable lymphadenopathy Psych:  Cognition and judgment appear intact. Alert and cooperative with normal attention span and concentration. No apparent delusions, illusions, hallucinations      Assessment & Plan:   Obesity  Insomnia  OSA (obstructive sleep apnea)  Well woman exam No Follow-up on file.

## 2015-01-03 NOTE — Addendum Note (Signed)
Addended by: Creekside Lions on: 01/03/2015 01:50 PM   Modules accepted: Orders

## 2015-01-03 NOTE — Progress Notes (Signed)
Pre visit review using our clinic review tool, if applicable. No additional management support is needed unless otherwise documented below in the visit note. 

## 2015-01-04 ENCOUNTER — Encounter: Payer: Self-pay | Admitting: *Deleted

## 2015-01-05 LAB — CERVICOVAGINAL ANCILLARY ONLY: CANDIDA VAGINITIS: NEGATIVE

## 2015-01-05 LAB — CYTOLOGY - PAP

## 2015-01-11 MED ORDER — METRONIDAZOLE 500 MG PO TABS: 500.0000 mg | ORAL_TABLET | Freq: Two times a day (BID) | ORAL | Status: DC

## 2015-01-11 NOTE — Addendum Note (Signed)
Addended by: Modena Nunnery on: 01/11/2015 04:16 PM   Modules accepted: Orders

## 2015-01-23 ENCOUNTER — Telehealth: Payer: Self-pay

## 2015-01-23 MED ORDER — METRONIDAZOLE 0.75 % VA GEL: 1.0000 | Freq: Two times a day (BID) | VAGINAL | Status: DC

## 2015-01-23 NOTE — Telephone Encounter (Signed)
Left voicemail on patients home phone to return my call.

## 2015-01-23 NOTE — Telephone Encounter (Signed)
Pt called back and still having vaginal itching and pt has finished flagyl and request refill of flagyl to [facility] [address]. Pt request cb. Pt also requesting treatment for her husband as well )request on pts husband chart has already been submitted. Pt said had sexual activity last night and today both her and her husband are itching.

## 2015-01-23 NOTE — Telephone Encounter (Signed)
It is typically not necessary to treat sexual partners but recommended to use condoms or avoid intercourse while receiving treatment.  I can send in topical flagyl and that way he can use some as well.  Let me know if this is not effective.

## 2015-01-23 NOTE — Telephone Encounter (Signed)
Patient notified and she states that her partner would like something that is not a cream or topical treatment.

## 2015-01-23 NOTE — Telephone Encounter (Signed)
Pt left v/m requesting cb about a med. Left v/m requesting pt to cb.

## 2015-01-24 MED ORDER — METRONIDAZOLE 500 MG PO TABS: 500.0000 mg | ORAL_TABLET | Freq: Two times a day (BID) | ORAL | Status: DC

## 2015-01-24 NOTE — Telephone Encounter (Signed)
eRx sent for oral flagyl.  Metrogel removed from med list.

## 2015-01-24 NOTE — Addendum Note (Signed)
Addended by: Lucille Passy on: 01/24/2015 10:08 AM   Modules accepted: Orders, Medications

## 2015-01-24 NOTE — Telephone Encounter (Addendum)
Spoke to pt and advised per Dr Deborra Medina. Pt is requesting a new Rx as she states she had alcohol, which the medication may be ineffective with alcohol intake. Advised pt that unfortunately, we do not have standing orders for abx. I then informed pt she will need to take all medications as directed and that we are not able to prescribe another medication; and that her drinking alcohol was a personal decision, knowing that the medication may not work as effective. Advised pt that she may use cream sent to pharmacy which is also effective

## 2015-01-24 NOTE — Telephone Encounter (Signed)
There is nothing that I can give him orally.  Bacterial vaginosis cannot be seen in males. The genitals of men do not offer the similar kind of environment like the genitals of a woman for the infections to flourish. However, men can make women's infection worse through regular sexual contacts, as their genitals may have traces of bacteria transferred to them by their sexual partner.

## 2015-01-25 ENCOUNTER — Encounter: Payer: Self-pay | Admitting: Family Medicine

## 2015-01-25 ENCOUNTER — Encounter: Payer: Managed Care, Other (non HMO) | Admitting: Family Medicine

## 2015-01-25 MED ORDER — METRONIDAZOLE 500 MG PO TABS: 500.0000 mg | ORAL_TABLET | Freq: Two times a day (BID) | ORAL | Status: DC

## 2015-01-25 NOTE — Progress Notes (Signed)
Pt did not realize that I had already sent in rx for flagyl- see rx.  Appt cancelled- no charge.

## 2015-01-25 NOTE — Progress Notes (Signed)
Pre visit review using our clinic review tool, if applicable. No additional management support is needed unless otherwise documented below in the visit note. 

## 2015-03-15 ENCOUNTER — Other Ambulatory Visit: Payer: Self-pay

## 2015-04-07 ENCOUNTER — Other Ambulatory Visit: Payer: Self-pay | Admitting: General Surgery

## 2015-04-15 ENCOUNTER — Encounter: Payer: Managed Care, Other (non HMO) | Attending: Surgery | Admitting: Dietician

## 2015-04-15 ENCOUNTER — Encounter: Payer: Self-pay | Admitting: Dietician

## 2015-04-15 VITALS — Ht 61.0 in | Wt 260.7 lb

## 2015-04-15 DIAGNOSIS — Z6841 Body Mass Index (BMI) 40.0 and over, adult: Secondary | ICD-10-CM | POA: Insufficient documentation

## 2015-04-15 DIAGNOSIS — E669 Obesity, unspecified: Secondary | ICD-10-CM | POA: Diagnosis not present

## 2015-04-15 DIAGNOSIS — Z713 Dietary counseling and surveillance: Secondary | ICD-10-CM | POA: Diagnosis not present

## 2015-04-15 NOTE — Progress Notes (Signed)
  Pre-Op Assessment Visit:  Pre-Operative Sleeve Gastrectomy Surgery  Medical Nutrition Therapy:  Appt start time: 105   End time:  135  Patient was seen on 04/15/2015 for Pre-Operative Nutrition Assessment. Assessment and letter of approval faxed to [facility] Surgery Bariatric Surgery Program coordinator on 04/15/2015.   Preferred Learning Style:   No preference indicated   Learning Readiness:   Ready  Handouts given during visit include:  Pre-Op Goals Bariatric Surgery Protein Shakes   During the appointment today the following Pre-Op Goals were reviewed with the patient: Maintain or lose weight as instructed by your surgeon Make healthy food choices Begin to limit portion sizes Limited concentrated sugars and fried foods Keep fat/sugar in the single digits per serving on   food labels Practice CHEWING your food  (aim for 30 chews per bite or until applesauce consistency) Practice not drinking 15 minutes before, during, and 30 minutes after each meal/snack Avoid all carbonated beverages  Avoid/limit caffeinated beverages  Avoid all sugar-sweetened beverages Consume 3 meals per day; eat every 3-5 hours Make a list of non-food related activities Aim for 64-100 ounces of FLUID daily  Aim for at least 60-80 grams of PROTEIN daily Look for a liquid protein source that contain ?15 g protein and ?5 g carbohydrate  (ex: shakes, drinks, shots)  Patient-Centered Goals: -Overall health -Not being tired when she goes up the steps -Resolution of sleep apnea and reflux  Scale of 1-10: confidence (10) / importance (10)  Demonstrated degree of understanding via:  Teach Back  Teaching Method Utilized:  Visual Auditory Hands on  Barriers to learning/adherence to lifestyle change: none  Patient to call the [facility] to enroll in Pre-Op and Post-Op Nutrition Education when surgery date is scheduled.

## 2015-04-28 ENCOUNTER — Encounter (HOSPITAL_COMMUNITY): Payer: Managed Care, Other (non HMO) | Attending: General Surgery

## 2015-04-28 ENCOUNTER — Ambulatory Visit (HOSPITAL_COMMUNITY): Admission: RE | Admit: 2015-04-28 | Payer: Managed Care, Other (non HMO) | Source: Ambulatory Visit

## 2015-04-28 ENCOUNTER — Inpatient Hospital Stay (HOSPITAL_COMMUNITY): Admission: RE | Admit: 2015-04-28 | Payer: Managed Care, Other (non HMO) | Source: Ambulatory Visit

## 2015-05-05 ENCOUNTER — Ambulatory Visit: Payer: Managed Care, Other (non HMO)

## 2015-05-30 ENCOUNTER — Ambulatory Visit (INDEPENDENT_AMBULATORY_CARE_PROVIDER_SITE_OTHER): Payer: Managed Care, Other (non HMO) | Admitting: Psychiatry

## 2015-06-01 ENCOUNTER — Ambulatory Visit: Payer: Managed Care, Other (non HMO) | Admitting: Psychiatry

## 2015-06-13 ENCOUNTER — Ambulatory Visit (INDEPENDENT_AMBULATORY_CARE_PROVIDER_SITE_OTHER): Payer: Managed Care, Other (non HMO) | Admitting: Psychiatry

## 2015-07-03 ENCOUNTER — Encounter: Payer: Managed Care, Other (non HMO) | Attending: Surgery

## 2015-07-03 VITALS — Ht 61.0 in | Wt 260.0 lb

## 2015-07-03 DIAGNOSIS — E669 Obesity, unspecified: Secondary | ICD-10-CM | POA: Diagnosis not present

## 2015-07-03 DIAGNOSIS — Z713 Dietary counseling and surveillance: Secondary | ICD-10-CM | POA: Insufficient documentation

## 2015-07-03 DIAGNOSIS — Z6841 Body Mass Index (BMI) 40.0 and over, adult: Secondary | ICD-10-CM | POA: Diagnosis not present

## 2015-07-03 NOTE — Progress Notes (Signed)
  Pre-Operative Nutrition Class:  Appt start time: 830   End time:  930.  Patient was seen on 07/03/2015 for Pre-Operative Bariatric Surgery Education at the [facility].   Surgery date:  Surgery type: Sleeve gastrectomy Start weight at [facility]: 261 lbs on 04/15/2015 Weight today: 260 lbs  TANITA  BODY COMP RESULTS  07/03/15   BMI (kg/m^2) N/A   Fat Mass (lbs)    Fat Free Mass (lbs)    Total Body Water (lbs)    Samples given per MNT protocol. Patient educated on appropriate usage: Premier protein shake (vanilla - qty 1) Lot #: [id number] Exp: 12/2015  Celebrate Calcium Citrate chew (caramel - qty 1) Lot #: [id number] Exp: 09/2016  PB2 (qty 1) Lot #: [id number] Exp: 04/2017  Renee Pain Protein Powder (chicken soup - qty 1) Lot #: [id number] Exp: 05/2016  The following the learning objectives were met by the patient during this course:  Identify Pre-Op Dietary Goals and will begin 2 weeks pre-operatively  Identify appropriate sources of fluids and proteins   State protein recommendations and appropriate sources pre and post-operatively  Identify Post-Operative Dietary Goals and will follow for 2 weeks post-operatively  Identify appropriate multivitamin and calcium sources  Describe the need for physical activity post-operatively and will follow MD recommendations  State when to call healthcare provider regarding medication questions or post-operative complications  Handouts given during class include:  Pre-Op Bariatric Surgery Diet Handout  Protein Shake Handout  Post-Op Bariatric Surgery Nutrition Handout  BELT Program Information Flyer  Support Group Information Flyer  [facility] Bariatric Supplements Price List  Follow-Up Plan: Patient will follow-up at [facility] 2 weeks post operatively for diet advancement per MD.

## 2015-07-07 ENCOUNTER — Ambulatory Visit
Admission: RE | Admit: 2015-07-07 | Discharge: 2015-07-07 | Disposition: A | Discharge status: Home / Self Care | Payer: Managed Care, Other (non HMO) | Source: Ambulatory Visit | Attending: General Surgery | Admitting: General Surgery

## 2015-07-07 ENCOUNTER — Other Ambulatory Visit: Payer: Self-pay | Admitting: General Surgery

## 2015-07-07 DIAGNOSIS — Z01818 Encounter for other preprocedural examination: Secondary | ICD-10-CM | POA: Diagnosis not present

## 2015-07-09 ENCOUNTER — Other Ambulatory Visit: Payer: Self-pay | Admitting: General Surgery

## 2015-07-09 ENCOUNTER — Ambulatory Visit
Admission: RE | Admit: 2015-07-09 | Discharge: 2015-07-09 | Disposition: A | Discharge status: Home / Self Care | Payer: Managed Care, Other (non HMO) | Source: Ambulatory Visit | Attending: General Surgery | Admitting: General Surgery

## 2015-07-09 DIAGNOSIS — Z01818 Encounter for other preprocedural examination: Secondary | ICD-10-CM | POA: Diagnosis not present

## 2015-07-13 ENCOUNTER — Ambulatory Visit (HOSPITAL_COMMUNITY): Payer: Managed Care, Other (non HMO)

## 2015-07-19 ENCOUNTER — Other Ambulatory Visit: Payer: Self-pay | Admitting: General Surgery

## 2015-07-20 NOTE — Patient Instructions (Addendum)
Amahri Hoppes  07/20/2015   Your procedure is scheduled on: Tuesday 07-25-15  Report to [facility] Main  Entrance take [facility]  elevators to 3rd floor to  [place] at 13:45 AM.  Call this number if you have problems the morning of surgery [phone]   Remember: ONLY 1 PERSON MAY GO WITH YOU TO SHORT STAY TO GET  READY MORNING OF [place].  Do not eat food or drink liquids :After Midnight.     Take these medicines the morning of surgery with A SIP OF WATER: NONE DO NOT TAKE ANY DIABETIC MEDICATIONS DAY OF YOUR SURGERY                               You may not have any metal on your body including hair pins and              piercings  Do not wear jewelry, make-up, lotions, powders or perfumes, deodorant             Do not wear nail polish.  Do not shave  48 hours prior to surgery.              Men may shave face and neck.   Do not bring valuables to the hospital. [place].  Contacts, dentures or bridgework may not be worn into surgery.  Leave suitcase in the car. After surgery it may be brought to your room.    Special Instructions: Practice deep breathing and leg exercises              Please read over the following fact sheets you were given: _____________________________________________________________________             [facility] - Preparing for Surgery Before surgery, you can play an important role.  Because skin is not sterile, your skin needs to be as free of germs as possible.  You can reduce the number of germs on your skin by washing with CHG (chlorahexidine gluconate) soap before surgery.  CHG is an antiseptic cleaner which kills germs and bonds with the skin to continue killing germs even after washing. Please DO NOT use if you have an allergy to CHG or antibacterial soaps.  If your skin becomes reddened/irritated stop using the CHG and inform your nurse when you arrive at Short  Stay. Do not shave (including legs and underarms) for at least 48 hours prior to the first CHG shower.  You may shave your face/neck. Please follow these instructions carefully:  1.  Shower with CHG Soap the night before surgery and the  morning of Surgery.  2.  If you choose to wash your hair, wash your hair first as usual with your  normal  shampoo.  3.  After you shampoo, rinse your hair and body thoroughly to remove the  shampoo.                           4.  Use CHG as you would any other liquid soap.  You can apply chg directly  to the skin and wash  Gently with a scrungie or clean washcloth.  5.  Apply the CHG Soap to your body ONLY FROM THE NECK DOWN.   Do not use on face/ open                           Wound or open sores. Avoid contact with eyes, ears mouth and genitals (private parts).                       Wash face,  Genitals (private parts) with your normal soap.             6.  Wash thoroughly, paying special attention to the area where your surgery  will be performed.  7.  Thoroughly rinse your body with warm water from the neck down.  8.  DO NOT shower/wash with your normal soap after using and rinsing off  the CHG Soap.                9.  Pat yourself dry with a clean towel.            10.  Wear clean pajamas.            11.  Place clean sheets on your bed the night of your first shower and do not  sleep with pets. Day of Surgery : Do not apply any lotions/deodorants the morning of surgery.  Please wear clean clothes to the hospital/surgery center.  FAILURE TO FOLLOW THESE INSTRUCTIONS MAY RESULT IN THE CANCELLATION OF YOUR SURGERY PATIENT SIGNATURE_________________________________  NURSE SIGNATURE__________________________________  ________________________________________________________________________

## 2015-07-21 ENCOUNTER — Encounter (HOSPITAL_COMMUNITY)
Admission: RE | Admit: 2015-07-21 | Discharge: 2015-07-21 | Disposition: A | Discharge status: Home / Self Care | Payer: Managed Care, Other (non HMO) | Source: Ambulatory Visit | Attending: General Surgery | Admitting: General Surgery

## 2015-07-21 ENCOUNTER — Encounter (HOSPITAL_COMMUNITY): Payer: Self-pay

## 2015-07-21 ENCOUNTER — Other Ambulatory Visit (HOSPITAL_COMMUNITY): Payer: Self-pay

## 2015-07-21 DIAGNOSIS — Z01818 Encounter for other preprocedural examination: Secondary | ICD-10-CM | POA: Diagnosis not present

## 2015-07-21 HISTORY — DX: Personal history of other diseases of the digestive system: Z87.19

## 2015-07-21 HISTORY — DX: Sleep apnea, unspecified: G47.30

## 2015-07-21 HISTORY — DX: Gastro-esophageal reflux disease without esophagitis: K21.9

## 2015-07-21 NOTE — Pre-Procedure Instructions (Addendum)
EKG 07-07-15 epic CXR 07-09-15 epic  Pt's last dose of Phentermine was Monday 07-17-15  Spoke with Abigail Butts at [facility].  She is putting in orders for pt's labs at [facility].  Pt requested to have labs done there so insurance will cover it.  Lab results to be faxed here.

## 2015-07-24 NOTE — Progress Notes (Signed)
Cbc with dif, cmet, serum pregnancy drawn 07-21-2015 at [facility] on patient chart

## 2015-07-25 ENCOUNTER — Inpatient Hospital Stay (HOSPITAL_COMMUNITY): Payer: Managed Care, Other (non HMO) | Admitting: Anesthesiology

## 2015-07-25 ENCOUNTER — Encounter (HOSPITAL_COMMUNITY): Payer: Self-pay | Admitting: *Deleted

## 2015-07-25 ENCOUNTER — Inpatient Hospital Stay (HOSPITAL_COMMUNITY)
Admission: RE | Admit: 2015-07-25 | Discharge: 2015-07-27 | DRG: 621 | Disposition: A | Discharge status: Home / Self Care | Payer: Managed Care, Other (non HMO) | Source: Ambulatory Visit | Attending: General Surgery | Admitting: General Surgery

## 2015-07-25 ENCOUNTER — Encounter (HOSPITAL_COMMUNITY)
Admission: RE | Disposition: A | Discharge status: Home / Self Care | Payer: Self-pay | Source: Ambulatory Visit | Attending: General Surgery

## 2015-07-25 DIAGNOSIS — K219 Gastro-esophageal reflux disease without esophagitis: Secondary | ICD-10-CM | POA: Diagnosis present

## 2015-07-25 DIAGNOSIS — Z6841 Body Mass Index (BMI) 40.0 and over, adult: Secondary | ICD-10-CM | POA: Diagnosis not present

## 2015-07-25 DIAGNOSIS — G4733 Obstructive sleep apnea (adult) (pediatric): Secondary | ICD-10-CM | POA: Diagnosis present

## 2015-07-25 DIAGNOSIS — R600 Localized edema: Secondary | ICD-10-CM | POA: Diagnosis present

## 2015-07-25 DIAGNOSIS — G8929 Other chronic pain: Secondary | ICD-10-CM | POA: Diagnosis present

## 2015-07-25 DIAGNOSIS — M2550 Pain in unspecified joint: Secondary | ICD-10-CM | POA: Diagnosis present

## 2015-07-25 DIAGNOSIS — D72829 Elevated white blood cell count, unspecified: Secondary | ICD-10-CM | POA: Diagnosis not present

## 2015-07-25 HISTORY — PX: LAPAROSCOPIC GASTRIC SLEEVE RESECTION: SHX5895

## 2015-07-25 LAB — HEMOGLOBIN AND HEMATOCRIT, BLOOD
HCT: 37 % (ref 36.0–46.0)
HEMOGLOBIN: 12.4 g/dL (ref 12.0–15.0)

## 2015-07-25 SURGERY — GASTRECTOMY, SLEEVE, LAPAROSCOPIC
Anesthesia: General

## 2015-07-25 MED ORDER — ONDANSETRON HCL 4 MG/2ML IJ SOLN
INTRAMUSCULAR | Status: DC | PRN
  Administered 2015-07-25: 4 mg via INTRAVENOUS

## 2015-07-25 MED ORDER — SUCCINYLCHOLINE CHLORIDE 20 MG/ML IJ SOLN
INTRAMUSCULAR | Status: DC | PRN
  Administered 2015-07-25: 100 mg via INTRAVENOUS

## 2015-07-25 MED ORDER — MIDAZOLAM HCL 5 MG/5ML IJ SOLN
INTRAMUSCULAR | Status: DC | PRN
Start: 2015-07-25 — End: 2015-07-25
  Administered 2015-07-25: 2 mg via INTRAVENOUS

## 2015-07-25 MED ORDER — ROCURONIUM BROMIDE 100 MG/10ML IV SOLN
INTRAVENOUS | Status: AC
  Filled 2015-07-25: qty 1

## 2015-07-25 MED ORDER — BUPIVACAINE-EPINEPHRINE 0.25% -1:200000 IJ SOLN
INTRAMUSCULAR | Status: DC | PRN
  Administered 2015-07-25: 14 mL

## 2015-07-25 MED ORDER — LIDOCAINE HCL (CARDIAC) 20 MG/ML IV SOLN
INTRAVENOUS | Status: DC | PRN
  Administered 2015-07-25: 50 mg via INTRATRACHEAL

## 2015-07-25 MED ORDER — ONDANSETRON HCL 4 MG/2ML IJ SOLN
INTRAMUSCULAR | Status: AC
  Filled 2015-07-25: qty 2

## 2015-07-25 MED ORDER — ACETAMINOPHEN 160 MG/5ML PO SOLN: 650.0000 mg | ORAL | Status: DC | PRN

## 2015-07-25 MED ORDER — PROPOFOL 10 MG/ML IV BOLUS
INTRAVENOUS | Status: DC | PRN
  Administered 2015-07-25: 200 mg via INTRAVENOUS

## 2015-07-25 MED ORDER — MORPHINE SULFATE (PF) 2 MG/ML IV SOLN
2.0000 mg | INTRAVENOUS | Status: DC | PRN
  Administered 2015-07-25: 6 mg via INTRAVENOUS
  Administered 2015-07-25: 2 mg via INTRAVENOUS
  Administered 2015-07-25: 6 mg via INTRAVENOUS
  Administered 2015-07-26: 4 mg via INTRAVENOUS
  Filled 2015-07-25: qty 3
  Filled 2015-07-25: qty 1
  Filled 2015-07-25: qty 2
  Filled 2015-07-25: qty 3

## 2015-07-25 MED ORDER — HYDROMORPHONE HCL 1 MG/ML IJ SOLN
0.5000 mg | INTRAMUSCULAR | Status: DC | PRN
  Administered 2015-07-25: 0.5 mg via INTRAVENOUS

## 2015-07-25 MED ORDER — LACTATED RINGERS IR SOLN
Status: DC | PRN
  Administered 2015-07-25: 1000 mL

## 2015-07-25 MED ORDER — DEXTROSE 5 % IV SOLN
2.0000 g | INTRAVENOUS | Status: AC
  Administered 2015-07-25: 2 g via INTRAVENOUS

## 2015-07-25 MED ORDER — ONDANSETRON HCL 4 MG/2ML IJ SOLN: 4.0000 mg | Freq: Once | INTRAMUSCULAR | Status: DC | PRN

## 2015-07-25 MED ORDER — BUPIVACAINE LIPOSOME 1.3 % IJ SUSP
INTRAMUSCULAR | Status: DC | PRN
  Administered 2015-07-25: 20 mL

## 2015-07-25 MED ORDER — OXYCODONE HCL 5 MG/5ML PO SOLN
5.0000 mg | ORAL | Status: DC | PRN
  Administered 2015-07-26: 5 mg via ORAL
  Administered 2015-07-26 – 2015-07-27 (×2): 10 mg via ORAL
  Filled 2015-07-25: qty 10
  Filled 2015-07-25: qty 50
  Filled 2015-07-25: qty 5

## 2015-07-25 MED ORDER — CHLORHEXIDINE GLUCONATE CLOTH 2 % EX PADS: 6.0000 | MEDICATED_PAD | Freq: Once | CUTANEOUS | Status: DC

## 2015-07-25 MED ORDER — EVICEL 5 ML EX KIT
PACK | CUTANEOUS | Status: DC | PRN
  Administered 2015-07-25: 5 mL

## 2015-07-25 MED ORDER — LABETALOL HCL 5 MG/ML IV SOLN
INTRAVENOUS | Status: AC
Start: 2015-07-25 — End: 2015-07-25
  Filled 2015-07-25: qty 4

## 2015-07-25 MED ORDER — KCL IN DEXTROSE-NACL 20-5-0.9 MEQ/L-%-% IV SOLN
INTRAVENOUS | Status: DC
  Administered 2015-07-25: 16:00:00 via INTRAVENOUS
  Administered 2015-07-27: 125 mL/h via INTRAVENOUS
  Filled 2015-07-25 (×7): qty 1000

## 2015-07-25 MED ORDER — PROPOFOL 10 MG/ML IV BOLUS
INTRAVENOUS | Status: AC
  Filled 2015-07-25: qty 20

## 2015-07-25 MED ORDER — DEXTROSE 5 % IV SOLN
INTRAVENOUS | Status: AC
  Filled 2015-07-25: qty 2

## 2015-07-25 MED ORDER — STERILE WATER FOR IRRIGATION IR SOLN
Status: DC | PRN
  Administered 2015-07-25: 1000 mL

## 2015-07-25 MED ORDER — 0.9 % SODIUM CHLORIDE (POUR BTL) OPTIME
TOPICAL | Status: DC | PRN
  Administered 2015-07-25: 1000 mL

## 2015-07-25 MED ORDER — NEOSTIGMINE METHYLSULFATE 10 MG/10ML IV SOLN
INTRAVENOUS | Status: DC | PRN
  Administered 2015-07-25: 4 mg via INTRAVENOUS

## 2015-07-25 MED ORDER — DEXAMETHASONE SODIUM PHOSPHATE 10 MG/ML IJ SOLN
INTRAMUSCULAR | Status: DC | PRN
  Administered 2015-07-25: 10 mg via INTRAVENOUS

## 2015-07-25 MED ORDER — PREMIER PROTEIN SHAKE: 2.0000 [oz_av] | Freq: Four times a day (QID) | ORAL | Status: DC

## 2015-07-25 MED ORDER — GLYCOPYRROLATE 0.2 MG/ML IJ SOLN
INTRAMUSCULAR | Status: DC | PRN
  Administered 2015-07-25: 0.6 mg via INTRAVENOUS

## 2015-07-25 MED ORDER — ENOXAPARIN SODIUM 30 MG/0.3ML ~~LOC~~ SOLN
30.0000 mg | Freq: Two times a day (BID) | SUBCUTANEOUS | Status: DC
  Administered 2015-07-25 – 2015-07-27 (×4): 30 mg via SUBCUTANEOUS
  Filled 2015-07-25 (×5): qty 0.3

## 2015-07-25 MED ORDER — EVICEL 5 ML EX KIT
PACK | Freq: Once | CUTANEOUS | Status: DC
  Filled 2015-07-25: qty 1

## 2015-07-25 MED ORDER — PHENYLEPHRINE HCL 10 MG/ML IJ SOLN
INTRAMUSCULAR | Status: DC | PRN
  Administered 2015-07-25: 80 ug via INTRAVENOUS

## 2015-07-25 MED ORDER — FENTANYL CITRATE (PF) 250 MCG/5ML IJ SOLN
INTRAMUSCULAR | Status: DC | PRN
  Administered 2015-07-25: 25 ug via INTRAVENOUS
  Administered 2015-07-25: 100 ug via INTRAVENOUS
  Administered 2015-07-25 (×3): 25 ug via INTRAVENOUS

## 2015-07-25 MED ORDER — MIDAZOLAM HCL 2 MG/2ML IJ SOLN
INTRAMUSCULAR | Status: AC
  Filled 2015-07-25: qty 2

## 2015-07-25 MED ORDER — GLYCOPYRROLATE 0.2 MG/ML IJ SOLN
INTRAMUSCULAR | Status: AC
  Filled 2015-07-25: qty 3

## 2015-07-25 MED ORDER — ONDANSETRON HCL 4 MG/2ML IJ SOLN
4.0000 mg | INTRAMUSCULAR | Status: DC | PRN
  Administered 2015-07-25 – 2015-07-27 (×4): 4 mg via INTRAVENOUS
  Filled 2015-07-25 (×4): qty 2

## 2015-07-25 MED ORDER — FENTANYL CITRATE (PF) 250 MCG/5ML IJ SOLN
INTRAMUSCULAR | Status: AC
  Filled 2015-07-25: qty 5

## 2015-07-25 MED ORDER — SODIUM CHLORIDE 0.9 % IJ SOLN
INTRAMUSCULAR | Status: AC
  Filled 2015-07-25: qty 20

## 2015-07-25 MED ORDER — ACETAMINOPHEN 160 MG/5ML PO SOLN: 325.0000 mg | ORAL | Status: DC | PRN

## 2015-07-25 MED ORDER — HYDROMORPHONE HCL 1 MG/ML IJ SOLN
INTRAMUSCULAR | Status: AC
  Filled 2015-07-25: qty 1

## 2015-07-25 MED ORDER — PANTOPRAZOLE SODIUM 40 MG IV SOLR
40.0000 mg | Freq: Every day | INTRAVENOUS | Status: DC
  Administered 2015-07-25 – 2015-07-26 (×2): 40 mg via INTRAVENOUS
  Filled 2015-07-25 (×3): qty 40

## 2015-07-25 MED ORDER — CETYLPYRIDINIUM CHLORIDE 0.05 % MT LIQD
7.0000 mL | Freq: Two times a day (BID) | OROMUCOSAL | Status: DC
  Administered 2015-07-25 – 2015-07-26 (×3): 7 mL via OROMUCOSAL

## 2015-07-25 MED ORDER — BUPIVACAINE LIPOSOME 1.3 % IJ SUSP
20.0000 mL | Freq: Once | INTRAMUSCULAR | Status: DC
  Filled 2015-07-25: qty 20

## 2015-07-25 MED ORDER — BUPIVACAINE-EPINEPHRINE 0.25% -1:200000 IJ SOLN
INTRAMUSCULAR | Status: AC
  Filled 2015-07-25: qty 1

## 2015-07-25 MED ORDER — DEXAMETHASONE SODIUM PHOSPHATE 10 MG/ML IJ SOLN
INTRAMUSCULAR | Status: AC
  Filled 2015-07-25: qty 1

## 2015-07-25 MED ORDER — ROCURONIUM BROMIDE 100 MG/10ML IV SOLN
INTRAVENOUS | Status: DC | PRN
  Administered 2015-07-25: 10 mg via INTRAVENOUS
  Administered 2015-07-25: 5 mg via INTRAVENOUS
  Administered 2015-07-25: 10 mg via INTRAVENOUS
  Administered 2015-07-25: 40 mg via INTRAVENOUS

## 2015-07-25 MED ORDER — KCL IN DEXTROSE-NACL 20-5-0.9 MEQ/L-%-% IV SOLN
INTRAVENOUS | Status: AC
  Filled 2015-07-25: qty 1000

## 2015-07-25 MED ORDER — LIDOCAINE HCL (CARDIAC) 20 MG/ML IV SOLN
INTRAVENOUS | Status: AC
  Filled 2015-07-25: qty 5

## 2015-07-25 MED ORDER — LACTATED RINGERS IV SOLN: INTRAVENOUS | Status: DC

## 2015-07-25 MED ORDER — LACTATED RINGERS IV SOLN
INTRAVENOUS | Status: DC | PRN
  Administered 2015-07-25: 13:00:00 via INTRAVENOUS

## 2015-07-25 MED ORDER — SODIUM CHLORIDE 0.9 % IJ SOLN
INTRAMUSCULAR | Status: DC | PRN
  Administered 2015-07-25: 20 mL

## 2015-07-25 MED ORDER — LABETALOL HCL 5 MG/ML IV SOLN
INTRAVENOUS | Status: DC | PRN
  Administered 2015-07-25 (×2): 5 mg via INTRAVENOUS

## 2015-07-25 MED ORDER — HEPARIN SODIUM (PORCINE) 5000 UNIT/ML IJ SOLN
5000.0000 [IU] | INTRAMUSCULAR | Status: AC
  Administered 2015-07-25: 5000 [IU] via SUBCUTANEOUS
  Filled 2015-07-25: qty 1

## 2015-07-25 SURGICAL SUPPLY — 70 items
APPLICATOR COTTON TIP 6IN STRL (MISCELLANEOUS) IMPLANT
APPLIER CLIP ROT 10 11.4 M/L (STAPLE)
APPLIER CLIP ROT 13.4 12 LRG (CLIP)
BLADE SURG SZ11 CARB STEEL (BLADE) ×3 IMPLANT
CABLE HIGH FREQUENCY MONO STRZ (ELECTRODE) ×3 IMPLANT
CHLORAPREP W/TINT 26ML (MISCELLANEOUS) ×6 IMPLANT
CLIP APPLIE ROT 10 11.4 M/L (STAPLE) IMPLANT
CLIP APPLIE ROT 13.4 12 LRG (CLIP) IMPLANT
COVER SURGICAL LIGHT HANDLE (MISCELLANEOUS) IMPLANT
DEVICE PMI PUNCTURE CLOSURE (MISCELLANEOUS) ×3 IMPLANT
DEVICE SUT QUICK LOAD TK 5 (STAPLE) ×2 IMPLANT
DEVICE SUT TI-KNOT TK 5X26 (MISCELLANEOUS) ×2 IMPLANT
DEVICE SUTURE ENDOST 10MM (ENDOMECHANICALS) ×3 IMPLANT
DEVICE TI KNOT TK5 (MISCELLANEOUS) ×1
DRAPE CAMERA CLOSED 9X96 (DRAPES) ×3 IMPLANT
DRAPE UTILITY XL STRL (DRAPES) ×6 IMPLANT
ELECT REM PT RETURN 9FT ADLT (ELECTROSURGICAL) ×3
ELECTRODE REM PT RTRN 9FT ADLT (ELECTROSURGICAL) ×1 IMPLANT
GAUZE SPONGE 4X4 12PLY STRL (GAUZE/BANDAGES/DRESSINGS) IMPLANT
GLOVE BIOGEL PI IND STRL 7.0 (GLOVE) ×2 IMPLANT
GLOVE BIOGEL PI IND STRL 7.5 (GLOVE) ×2 IMPLANT
GLOVE BIOGEL PI INDICATOR 7.0 (GLOVE) ×4
GLOVE BIOGEL PI INDICATOR 7.5 (GLOVE) ×4
GLOVE ECLIPSE 6.5 STRL STRAW (GLOVE) ×3 IMPLANT
GLOVE ECLIPSE 7.5 STRL STRAW (GLOVE) ×3 IMPLANT
GLOVE INDICATOR 6.5 STRL GRN (GLOVE) ×9 IMPLANT
GLOVE SURG SIGNA 7.5 PF LTX (GLOVE) ×3 IMPLANT
GOWN STRL REUS W/TWL LRG LVL3 (GOWN DISPOSABLE) ×3 IMPLANT
GOWN STRL REUS W/TWL XL LVL3 (GOWN DISPOSABLE) ×12 IMPLANT
HOVERMATT SINGLE USE (MISCELLANEOUS) ×3 IMPLANT
KIT BASIN OR (CUSTOM PROCEDURE TRAY) ×3 IMPLANT
KIT DEFENDO BUTTON (KITS) ×3 IMPLANT
LIQUID BAND (GAUZE/BANDAGES/DRESSINGS) ×3 IMPLANT
MARKER SKIN DUAL TIP RULER LAB (MISCELLANEOUS) ×3 IMPLANT
NEEDLE SPNL 22GX3.5 QUINCKE BK (NEEDLE) ×3 IMPLANT
PACK UNIVERSAL I (CUSTOM PROCEDURE TRAY) ×3 IMPLANT
POUCH SPECIMEN RETRIEVAL 10MM (ENDOMECHANICALS) IMPLANT
QUICK LOAD TK 5 (STAPLE) ×1
RELOAD STAPLER BLUE 60MM (STAPLE) ×3 IMPLANT
RELOAD STAPLER GOLD 60MM (STAPLE) ×2 IMPLANT
RELOAD STAPLER GREEN 60MM (STAPLE) ×3 IMPLANT
SCISSORS LAP 5X45 EPIX DISP (ENDOMECHANICALS) ×3 IMPLANT
SET IRRIG TUBING LAPAROSCOPIC (IRRIGATION / IRRIGATOR) ×3 IMPLANT
SHEARS HARMONIC ACE PLUS 45CM (MISCELLANEOUS) ×3 IMPLANT
SLEEVE ADV FIXATION 5X100MM (TROCAR) ×9 IMPLANT
SLEEVE GASTRECTOMY 36FR VISIGI (MISCELLANEOUS) ×3 IMPLANT
SOLUTION ANTI FOG 6CC (MISCELLANEOUS) ×3 IMPLANT
SPONGE LAP 18X18 X RAY DECT (DISPOSABLE) ×3 IMPLANT
STAPLER ECHELON LONG 60 440 (INSTRUMENTS) ×3 IMPLANT
STAPLER RELOAD BLUE 60MM (STAPLE) ×9
STAPLER RELOAD GOLD 60MM (STAPLE) ×6
STAPLER RELOAD GREEN 60MM (STAPLE) ×9
SUT DEVICE BRAIDED 0X39 (SUTURE) IMPLANT
SUT MNCRL AB 4-0 PS2 18 (SUTURE) ×6 IMPLANT
SUT SURGIDAC NAB ES-9 0 48 120 (SUTURE) ×6 IMPLANT
SUT VICRYL 0 TIES 12 18 (SUTURE) ×3 IMPLANT
SYR 10ML ECCENTRIC (SYRINGE) ×6 IMPLANT
SYR 20CC LL (SYRINGE) ×3 IMPLANT
SYR 50ML LL SCALE MARK (SYRINGE) ×3 IMPLANT
TIP RIGID 35CM EVICEL (HEMOSTASIS) ×3 IMPLANT
TOWEL OR 17X26 10 PK STRL BLUE (TOWEL DISPOSABLE) ×3 IMPLANT
TOWEL OR NON WOVEN STRL DISP B (DISPOSABLE) ×3 IMPLANT
TROCAR ADV FIXATION 5X100MM (TROCAR) ×3 IMPLANT
TROCAR BLADELESS 15MM (ENDOMECHANICALS) ×3 IMPLANT
TROCAR BLADELESS OPT 5 100 (ENDOMECHANICALS) IMPLANT
TUBING CONNECTING 10 (TUBING) ×4 IMPLANT
TUBING CONNECTING 10' (TUBING) ×2
TUBING ENDO SMARTCAP PENTAX (MISCELLANEOUS) ×6 IMPLANT
TUBING FILTER THERMOFLATOR (ELECTROSURGICAL) ×3 IMPLANT
WATER STERILE IRR 500ML POUR (IV SOLUTION) ×3 IMPLANT

## 2015-07-25 NOTE — Transfer of Care (Signed)
Immediate Anesthesia Transfer of Care Note  Patient: Nancy Owen  Procedure(s) Performed: Procedure(s): LAPAROSCOPIC GASTRIC SLEEVE RESECTION (N/A)  Patient Location: PACU  Anesthesia Type:General  Level of Consciousness: awake, alert  and oriented  Airway & Oxygen Therapy: Patient Spontanous Breathing and Patient connected to face mask oxygen  Post-op Assessment: Report given to RN and Post -op Vital signs reviewed and stable  Post vital signs: Reviewed and stable  Last Vitals:  Filed Vitals:   07/25/15 1154  BP: 120/80  Pulse: 88  Temp: 36.4 C  Resp: 18    Complications: No apparent anesthesia complications

## 2015-07-25 NOTE — H&P (Signed)
  History of Present Illness Marland Kitchen T. Walker Sitar MD; 07/20/2015 12:20 PM) Patient words: morbid obesity.  The patient is a 30 year old female who presents with obesity. She returns for her preop visit prior to planned laparoscopic sleeve gastrectomy. She was referred by Dr Alphonsa Overall for consideration for surgical treatment for morbid obesity. The patient gives a history of progressive obesity since childhood despite multiple attempts at medical management. She has been able to lose up to 60 pounds at a time but then experiences progressive weight regain. Obesity has been affecting the patient in a number of ways including difficulty with routine activities due to back pain. She has persistent left lower extremity swelling since an accident that required surgery on her leg and has been told that this will not go down and less she is able to lose weight. Significant co-morbid illnesses have developed including obstructive sleep apnea for which she uses a CPAP. She is very concerned about her long-term health going forward at her current weight. After our initial consultation she elected to proceed with evaluation for laparoscopic sleeve gastrectomy.   Allergies Jeralyn Ruths, [place]; 07/20/2015 12:02 PM) No Known Drug Allergies08/31/2016  Medication History Jeralyn Ruths, CMA; 07/20/2015 12:02 PM) Medications Reconciled  Vitals Jearld Fenton Morris CMA; 07/20/2015 12:03 PM) 07/20/2015 12:02 PM Weight: 260.2 lb Height: 61in Body Surface Area: 2.11 m Body Mass Index: 49.16 kg/m  Temp.: 98.51F(Oral)  Pulse: 88 (Regular)  BP: 98/42 (Sitting, Left Arm, Standard)       Physical Exam Marland Kitchen T. Deshauna Cayson MD; 07/20/2015 12:20 PM) The physical exam findings are as follows: Note:General: Alert, morbidly obese African-American female, in no distress Skin: Warm and dry without rash or infection. HEENT: No palpable masses or thyromegaly. Sclera nonicteric. Lungs: Breath sounds clear and  equal. No wheezing or increased work of breathing. Cardiovascular: Regular rate and rhythm without murmer. No JVD or edema. Peripheral pulses intact. No carotid bruits. Abdomen: Nondistended. Soft and nontender. No masses palpable. No organomegaly. No palpable hernias. Extremities: No edema or joint swelling or deformity. No chronic venous stasis changes. Neurologic: Alert and fully oriented. Gait normal. No focal weakness. Psychiatric: Normal mood and affect. Thought content appropriate with normal judgement and insight    Assessment & Plan Marland Kitchen T. Tonja Jezewski MD; 07/20/2015 12:22 PM) MORBID OBESITY WITH BMI OF 45.0-49.9, ADULT (E66.01) Impression: Patient with progressive morbid obesity unresponsive to multiple efforts at medical management who presents with a BMI of 49 and comorbidities of obstructive sleep apnea, chronic joint pain, lower extremity edema and mild GERD.. I believe there would be very significant medical benefit from surgical weight loss. She is ready to proceed with sleeve gastrectomy.  Upper GI series showed a small hiatal hernia and we discussed that I would look for this and repair it if it seems significant in the operating room. She does have moderate reflux. We reviewed the procedure and recovery detail and reviewed the consent form again and all questions were answered. Current Plans Schedule for Surgery

## 2015-07-25 NOTE — Anesthesia Postprocedure Evaluation (Signed)
Anesthesia Post Note  Patient: Nancy Owen  Procedure(s) Performed: Procedure(s) (LRB): LAPAROSCOPIC GASTRIC SLEEVE RESECTION (N/A)  Patient location during evaluation: PACU Anesthesia Type: General Level of consciousness: awake, oriented and patient cooperative Pain management: pain level controlled Vital Signs Assessment: post-procedure vital signs reviewed and stable Respiratory status: spontaneous breathing and respiratory function stable Cardiovascular status: blood pressure returned to baseline Anesthetic complications: no    Last Vitals:  Filed Vitals:   07/25/15 1515 07/25/15 1530  BP: 118/77 128/90  Pulse: 73 80  Temp:    Resp: 25 20    Last Pain:  Filed Vitals:   07/25/15 1535  PainSc: 0-No pain                 [name] EDWARD

## 2015-07-25 NOTE — Interval H&P Note (Signed)
History and Physical Interval Note:  07/25/2015 12:48 PM  Nancy Owen  has presented today for surgery, with the diagnosis of Morbid Obesity  The various methods of treatment have been discussed with the patient and family. After consideration of risks, benefits and other options for treatment, the patient has consented to  Procedure(s): LAPAROSCOPIC GASTRIC SLEEVE RESECTION (N/A) as a surgical intervention .  The patient's history has been reviewed, patient examined, no change in status, stable for surgery.  I have reviewed the patient's chart and labs.  Questions were answered to the patient's satisfaction.     Rachell Druckenmiller T

## 2015-07-25 NOTE — Anesthesia Procedure Notes (Addendum)
Procedure Name: Intubation Date/Time: 07/25/2015 1:05 PM Performed by: Dione Booze Pre-anesthesia Checklist: Emergency Drugs available, Suction available, Patient being monitored and Patient identified Patient Re-evaluated:Patient Re-evaluated prior to inductionOxygen Delivery Method: Circle system utilized Preoxygenation: Pre-oxygenation with 100% oxygen Intubation Type: IV induction and Cricoid Pressure applied Laryngoscope Size: Mac and 4 Grade View: Grade II Tube type: Oral Tube size: 7.5 mm Number of attempts: 1 Airway Equipment and Method: Stylet Placement Confirmation: ETT inserted through vocal cords under direct vision,  breath sounds checked- equal and bilateral and positive ETCO2 Secured at: 21 cm Tube secured with: Tape Dental Injury: Teeth and Oropharynx as per pre-operative assessment  Comments: Small mouth, overbite

## 2015-07-25 NOTE — Anesthesia Preprocedure Evaluation (Signed)
Anesthesia Evaluation  Patient identified by MRN, date of birth, ID band Patient awake    Reviewed: Allergy & Precautions, NPO status , Patient's Chart, lab work & pertinent test results  Airway Mallampati: II  TM Distance: >3 FB     Dental   Pulmonary sleep apnea ,    Pulmonary exam normal        Cardiovascular Normal cardiovascular exam     Neuro/Psych    GI/Hepatic hiatal hernia, GERD  ,  Endo/Other  Morbid obesity  Renal/GU      Musculoskeletal   Abdominal   Peds  Hematology   Anesthesia Other Findings   Reproductive/Obstetrics                             Anesthesia Physical Anesthesia Plan  ASA: II  Anesthesia Plan: General   Post-op Pain Management:    Induction: Intravenous  Airway Management Planned: Oral ETT  Additional Equipment:   Intra-op Plan:   Post-operative Plan: Extubation in OR  Informed Consent: I have reviewed the patients History and Physical, chart, labs and discussed the procedure including the risks, benefits and alternatives for the proposed anesthesia with the patient or authorized representative who has indicated his/her understanding and acceptance.     Plan Discussed with: CRNA, Anesthesiologist and Surgeon  Anesthesia Plan Comments:         Anesthesia Quick Evaluation

## 2015-07-25 NOTE — Discharge Instructions (Addendum)

## 2015-07-25 NOTE — Op Note (Signed)
Preoperative Diagnosis: Morbid Obesity  Postoprative Diagnosis: Morbid Obesity  Procedure: Procedure(s): LAPAROSCOPIC GASTRIC SLEEVE RESECTION   Surgeon: Excell Seltzer T   Assistants: Alphonsa Overall  Anesthesia:  General endotracheal anesthesia  Indications: patient is a 30 year old female with progressive morbid obesity unresponsive to medical management who presents with a BMI of 49 and comorbidities of obstructive sleep apnea, lower extremity edema, GERD and chronic joint pain. After extensive preoperative workup and discussion detailed elsewhere with elected to proceed with laparoscopic sleeve gastrectomy for surgical treatment of her morbid obesity.    Procedure Detail:  Patient was brought to the operating room, placed in the supine position on operating table, and general endotracheal anesthesia induced. She received preoperative IV antibiotics and subcutaneous heparin. PAS replaced. The abdomen was widely sterilely prepped and draped. Patient timeout was performed and correct procedure verified. Access was obtained with a 5 mm Optiview trocar in the left upper quadrant without difficulty and pneumoperitoneum established. Under direct vision a 5 mm trocar was placed laterally in the right upper quadrant, a 15 mm trocar at the base of the falciform ligament, a 5 mm trocar above the left knee umbilicus for the camera port. Through a 5 mm subxiphoid site the [facility] retractor was placed in the left lobe liver elevated with excellent exposure of the stomach and hiatus. Finally 5 mm trochars placed laterally in the left upper quadrant.  The hiatus was examined and there didn't appear to be a hiatal hernia. The sizing tube was passed orally into the stomach and the 10 mL balloon was inflated and pulled back easily through the hiatus. We elect to proceed with repair. The hepatoduodenal ligament was opened in an avascular area with the Harmonic scalpel and the peritoneal dissection continued up  over the distal esophagus. The anterior border of the right crus was dissected and with blunt dissection the retroesophageal space entered and both crura clearly defined. There was a moderately large hiatal hernia. After complete dissection this was closed with 2 interrupted 0 Ethibond sutures snugging the hiatus back to a normal size. At this point the sizing tube was again passed into the stomach and the 10 mL balloon was pulled up snugly against the hiatus with tension. The balloon was desufflated and the tube removed. The sleeve dissection was begun at the greater curve dividing short gastric vessels with the Harmonic scalpel and the lesser sac easily entered. The dissection progressed proximally along the greater curve dividing further short gastrics. The fundus was mobilized away from the spleen and dissection carried down onto the left crus. The left crus was fully dissected as was the hiatus and fundus. We then worked back distally further dissecting the greater curve distally. We measured carefully 5 cm from the pylorus and stop the dissection at this point. There were no posterior adhesions. A 36 French VisiG tube was passed orally and advanced along the lesser curve to the pylorus. The stomach pulled out symmetrically and was placed on suction and fixed in place. This lead was begun with an initial firing of the 60 mm green load echelon stapler beginning 5 cm from the pylorus and angling up toward but staying well away from the incisura. A second firing of the green load stapler was used to continue the sleeve up past the incisura staying somewhat off of the tube at this point. The sleeve was then completed with a firing of the load 60 mm echelon stapler and 3 firings of the blue load 60 mm stapler staying close  to but not tight against the tube and with the final firing angling just out off of the edge of the esophageal fat pad. The staple line was intact and symmetrical and without bleeding. The VisiG  was used to inflate the sleeve and under saline irrigation was no evidence of leak. The VisiG was removed. Dr. Lucia Gaskins performed upper endoscopy showing a symmetrical sleeve without narrowing and no evidence of leak under saline irrigation. The abdomen was irrigated and hemostasis assured there was no evidence of trocar injury or other problems. The staple line of the sleeve was coated with Evaseal.  The sleeve specimen was removed through the 15 mm trocar site after dilating the somewhat. The fascial defect at this point was closed with a single 0 Vicryl suture. A bilateral abdominal wall block was performed with Exparel diluted to 40 mL. The Nathanson retractor was removed under direct vision. All CO2 was evacuated and trochars removed. Skin incisions were closed with subcuticular 4-0 Monocryl and Liquiban. Sponge needle and instrument counts were correct.    Findings: As above  Estimated Blood Loss:  Minimal         Drains: none  Blood Given: none          Specimens: greater curvature of stomach        Complications:  * No complications entered in OR log *         Disposition: PACU - hemodynamically stable.         Condition: stable

## 2015-07-26 LAB — CBC WITH DIFFERENTIAL/PLATELET
BASOS ABS: 0 10*3/uL (ref 0.0–0.1)
BASOS PCT: 0 %
Eosinophils Absolute: 0 10*3/uL (ref 0.0–0.7)
Eosinophils Relative: 0 %
HEMATOCRIT: 36.4 % (ref 36.0–46.0)
Hemoglobin: 12 g/dL (ref 12.0–15.0)
Lymphocytes Relative: 7 %
Lymphs Abs: 1.3 10*3/uL (ref 0.7–4.0)
MCH: 28 pg (ref 26.0–34.0)
MCHC: 33 g/dL (ref 30.0–36.0)
MCV: 85 fL (ref 78.0–100.0)
MONO ABS: 0.7 10*3/uL (ref 0.1–1.0)
Monocytes Relative: 4 %
NEUTROS ABS: 15.8 10*3/uL — AB (ref 1.7–7.7)
NEUTROS PCT: 89 %
Platelets: 259 10*3/uL (ref 150–400)
RBC: 4.28 MIL/uL (ref 3.87–5.11)
RDW: 13.9 % (ref 11.5–15.5)
WBC: 17.8 10*3/uL — AB (ref 4.0–10.5)

## 2015-07-26 LAB — HEMOGLOBIN AND HEMATOCRIT, BLOOD
HCT: 37.8 % (ref 36.0–46.0)
Hemoglobin: 12.2 g/dL (ref 12.0–15.0)

## 2015-07-26 NOTE — Progress Notes (Signed)
Patient ID: Nancy Owen, female   DOB: 1985-09-01, 30 y.o.   MRN: [id number] 1 Day Post-Op  Subjective: Mild abd pain, better than last night.  Mild occ nausea but none now.  Has been walking  Objective: Vital signs in last 24 hours: Temp:  [97.6 F (36.4 C)-99.1 F (37.3 C)] 99 F (37.2 C) (01/11 0524) Pulse Rate:  [72-96] 93 (01/11 0524) Resp:  [17-35] 17 (01/11 0524) BP: (118-149)/(72-90) 134/83 mmHg (01/11 0524) SpO2:  [98 %-100 %] 99 % (01/11 0524) Weight:  [115.837 kg (255 lb 6 oz)] 115.837 kg (255 lb 6 oz) (01/10 1154)    Intake/Output from previous day: 01/10 0701 - 01/11 0700 In: 2272.9 [I.V.:2272.9] Out: 1050 [Urine:1025; Blood:25] Intake/Output this shift: Total I/O In: 933.3 [I.V.:933.3] Out: 1025 [Urine:1025]  General appearance: alert, cooperative and no distress GI: Mild/moderate appropriate epigasttric tenderness Incision/Wound: No drainage or erythema  Lab Results:   Recent Labs  07/25/15 2240 07/26/15 0403  WBC  --  17.8*  HGB 12.4 12.0  HCT 37.0 36.4  PLT  --  259   BMET No results for input(s): NA, K, CL, CO2, GLUCOSE, BUN, CREATININE, CALCIUM in the last 72 hours.   Studies/Results: No results found.  Anti-infectives: Anti-infectives    Start     Dose/Rate Route Frequency Ordered Stop   07/25/15 1154  cefOXitin (MEFOXIN) 2 g in dextrose 5 % 50 mL IVPB     2 g 100 mL/hr over 30 Minutes Intravenous On call to O.R. 07/25/15 1154 07/25/15 1310      Assessment/Plan: s/p Procedure(s): LAPAROSCOPIC GASTRIC SLEEVE RESECTION Stable post op.  Start POD 1 diet.  Ambulation encouraged   LOS: 1 day    Arah Aro T 07/26/2015

## 2015-07-26 NOTE — Plan of Care (Signed)
Problem: Food- and Nutrition-Related Knowledge Deficit (NB-1.1) Goal: Nutrition education Formal process to instruct or train a patient/client in a skill or to impart knowledge to help patients/clients voluntarily manage or modify food choices and eating behavior to maintain or improve health. Outcome: Completed/Met Date Met:  07/26/15 Nutrition Education Note  Received consult for diet education per DROP protocol.   Discussed 2 week post op diet with pt. Emphasized that liquids must be non carbonated, non caffeinated, and sugar free. Fluid goals discussed. Pt to follow up with outpatient bariatric RD for further diet progression after 2 weeks. Multivitamins and minerals also reviewed. Teach back method used, pt expressed understanding, expect good compliance.   Diet: First 2 Weeks  You will see the dietitian about two (2) weeks after your surgery. The dietitian will increase the types of foods you can eat if you are handling liquids well:  If you have severe vomiting or nausea and cannot handle clear liquids lasting longer than 1 day, call your surgeon  Protein Shake  Drink at least 2 ounces of shake 5-6 times per day  Each serving of protein shakes (usually 8 - 12 ounces) should have a minimum of:  15 grams of protein  And no more than 5 grams of carbohydrate  Goal for protein each day:  Men = 80 grams per day  Women = 60 grams per day  Protein powder may be added to fluids such as non-fat milk or Lactaid milk or Soy milk (limit to 35 grams added protein powder per serving)   Hydration  Slowly increase the amount of water and other clear liquids as tolerated (See Acceptable Fluids)  Slowly increase the amount of protein shake as tolerated  Sip fluids slowly and throughout the day  May use sugar substitutes in small amounts (no more than 6 - 8 packets per day; i.e. Splenda)   Fluid Goal  The first goal is to drink at least 8 ounces of protein shake/drink per day (or as directed by the  nutritionist); some examples of protein shakes are [employer], [employer], EAS Edge HP, and Unjury. See handout from pre-op Bariatric Education Class:  Slowly increase the amount of protein shake you drink as tolerated  You may find it easier to slowly sip shakes throughout the day  It is important to get your proteins in first  Your fluid goal is to drink 64 - 100 ounces of fluid daily  It may take a few weeks to build up to this  32 oz (or more) should be clear liquids  And  32 oz (or more) should be full liquids (see below for examples)  Liquids should not contain sugar, caffeine, or carbonation   Clear Liquids:  Water or Sugar-free flavored water (i.e. Fruit H2O, Propel)  Decaffeinated coffee or tea (sugar-free)  Crystal Lite, Wyler's Lite, Minute Maid Lite  Sugar-free Jell-O  Bouillon or broth  Sugar-free Popsicle: *Less than 20 calories each; Limit 1 per day   Full Liquids:  Protein Shakes/Drinks + 2 choices per day of other full liquids  Full liquids must be:  No More Than 12 grams of Carbs per serving  No More Than 3 grams of Fat per serving  Strained low-fat cream soup  Non-Fat milk  Fat-free Lactaid Milk  Sugar-free yogurt (Dannon Lite & Fit, Greek yogurt)     Clayton Bibles, MS, RD, LDN Pager: [phone] After Hours Pager: [phone]

## 2015-07-27 LAB — CBC WITH DIFFERENTIAL/PLATELET
Basophils Absolute: 0 10*3/uL (ref 0.0–0.1)
Basophils Relative: 0 %
EOS ABS: 0 10*3/uL (ref 0.0–0.7)
EOS PCT: 0 %
HCT: 37.2 % (ref 36.0–46.0)
Hemoglobin: 11.8 g/dL — ABNORMAL LOW (ref 12.0–15.0)
LYMPHS ABS: 2.8 10*3/uL (ref 0.7–4.0)
LYMPHS PCT: 20 %
MCH: 28.2 pg (ref 26.0–34.0)
MCHC: 31.7 g/dL (ref 30.0–36.0)
MCV: 88.8 fL (ref 78.0–100.0)
MONO ABS: 0.9 10*3/uL (ref 0.1–1.0)
MONOS PCT: 6 %
Neutro Abs: 10.4 10*3/uL — ABNORMAL HIGH (ref 1.7–7.7)
Neutrophils Relative %: 74 %
PLATELETS: 261 10*3/uL (ref 150–400)
RBC: 4.19 MIL/uL (ref 3.87–5.11)
RDW: 14.7 % (ref 11.5–15.5)
WBC: 14.1 10*3/uL — AB (ref 4.0–10.5)

## 2015-07-27 NOTE — Progress Notes (Signed)
Patient alert and oriented, pain is controlled. Patient is tolerating fluids, advanced to protein shake today, patient is tolerating well.  Reviewed Gastric sleeve discharge instructions with patient and patient is able to articulate understanding.  Provided information on BELT program, Support Group and [facility] outpatient pharmacy. All questions answered, will continue to monitor.  

## 2015-07-27 NOTE — Discharge Summary (Signed)
   Patient ID: Nancy Owen [id number] 29 y.o. October 21, 1985  07/25/2015  Discharge date and time: 07/27/2015   Admitting Physician: Excell Seltzer T  Discharge Physician: Excell Seltzer T  Admission Diagnoses: Morbid Obesity  Discharge Diagnoses: Same  Operations: Procedure(s): LAPAROSCOPIC GASTRIC SLEEVE RESECTION  Admission Condition: good  Discharged Condition: good  Indication for Admission: patient is a 30 year old female with progressive morbid obesity unresponsive to medical management who presents with a BMI of 49 and multiple comorbidities including obstructive sleep apnea, chronic joint pain and GERD. After extensive preoperative workup and discussion detailed elsewhere we have elected to proceed with laparoscopic sleeve gastrectomy as initial treatment for her morbid obesity.  Hospital Course: on the morning of admission the patient underwent an uneventful laparoscopic sleeve gastrectomy. She had a moderate sized hiatal hernia that was repaired. Her postoperative course was unremarkable. On the first postoperative day she had some moderate expected pain controlled with medications. She did have leukocytosis to 17,000. She was ambulatory with a benign abdomen and tolerated water well. On the second postoperative day her pain is improved. Leukocytosis is decreased to 14,000. She is tolerating water and advanced to protein shakes. Abdomen is benign. She is feltstable for discharge.  Disposition: Home  Patient Instructions:    Medication List    TAKE these medications        levonorgestrel 20 MCG/24HR IUD  Commonly known as:  MIRENA  1 each by Intrauterine route once.     phentermine 37.5 MG capsule  Take 37.5 mg by mouth daily.        Activity: activity as tolerated Diet: protein shakes Wound Care: none needed  Follow-up:  With dr. Excell Seltzer in 3 weeks.  Signed: Edward Jolly MD, FACS  07/27/2015, 7:34 AM

## 2015-07-27 NOTE — Progress Notes (Signed)
Patient ID: Nancy Owen, female   DOB: March 13, 1986, 30 y.o.   MRN: [id number] 2 Days Post-Op  Subjective: Some epigastric pain but better than yesterday and controlled with oral meds.  Tolerating po water OK.  Ambulatory  Objective: Vital signs in last 24 hours: Temp:  [97.8 F (36.6 C)-98.8 F (37.1 C)] 98.8 F (37.1 C) (01/12 0607) Pulse Rate:  [79-102] 102 (01/12 0607) Resp:  [15-18] 16 (01/12 0607) BP: (120-144)/(72-86) 120/72 mmHg (01/12 0607) SpO2:  [99 %-100 %] 99 % (01/12 0607)    Intake/Output from previous day: 01/11 0701 - 01/12 0700 In: 300 [P.O.:300] Out: 1900 [Urine:1900] Intake/Output this shift:    General appearance: alert, cooperative and no distress GI: abnormal findings:  mild tenderness in the epigastrium and near incisions Incision/Wound: no erythema or drainage  Lab Results:   Recent Labs  07/26/15 0403 07/26/15 1642 07/27/15 0554  WBC 17.8*  --  14.1*  HGB 12.0 12.2 11.8*  HCT 36.4 37.8 37.2  PLT 259  --  261   BMET No results for input(s): NA, K, CL, CO2, GLUCOSE, BUN, CREATININE, CALCIUM in the last 72 hours.   Studies/Results: No results found.  Anti-infectives: Anti-infectives    Start     Dose/Rate Route Frequency Ordered Stop   07/25/15 1154  cefOXitin (MEFOXIN) 2 g in dextrose 5 % 50 mL IVPB     2 g 100 mL/hr over 30 Minutes Intravenous On call to O.R. 07/25/15 1154 07/25/15 1310      Assessment/Plan: s/p Procedure(s): LAPAROSCOPIC GASTRIC SLEEVE RESECTION Stable postoperatively. Leukocytosis improving.Okay to advance to protein shakes and discharge later today if tolerated.   LOS: 2 days    Cherene Dobbins T 07/27/2015

## 2015-08-01 ENCOUNTER — Telehealth (HOSPITAL_COMMUNITY): Payer: Self-pay

## 2015-08-01 NOTE — Telephone Encounter (Addendum)
Attempted DROP discharge phone call, no answer, left message to return call.  Made discharge phone call to patient per DROP protocol. Asking the following questions.    1. Do you have someone to care for you now that you are home?  yes 2. Are you having pain now that is not relieved by your pain medication?  no 3. Are you able to drink the recommended daily amount of fluids (48 ounces minimum/day) and protein (60-80 grams/day) as prescribed by the dietitian or nutritional counselor? yes  4. Are you taking the vitamins and minerals as prescribed?  yes 5. Do you have the "on call" number to contact your surgeon if you have a problem or question?  yes 6. Are your incisions free of redness, swelling or drainage? (If steri strips, address that these can fall off, shower as tolerated) yes 7. Have your bowels moved since your surgery?  If not, are you passing gas?  yes 8. Are you up and walking 3-4 times per day?  yes

## 2015-08-08 ENCOUNTER — Encounter: Payer: Managed Care, Other (non HMO) | Attending: Surgery

## 2015-08-08 DIAGNOSIS — E669 Obesity, unspecified: Secondary | ICD-10-CM | POA: Insufficient documentation

## 2015-08-08 DIAGNOSIS — Z713 Dietary counseling and surveillance: Secondary | ICD-10-CM | POA: Insufficient documentation

## 2015-08-08 DIAGNOSIS — Z6841 Body Mass Index (BMI) 40.0 and over, adult: Secondary | ICD-10-CM | POA: Insufficient documentation

## 2015-08-09 NOTE — Progress Notes (Signed)
Bariatric Class:  Appt start time: 1530 end time:  1630.  2 Week Post-Operative Nutrition Class  Patient was seen on 08/08/2015 for Post-Operative Nutrition education at the [facility].   Surgery date: 07/25/2015 Surgery type: Sleeve gastrectomy [facility] weight at [facility]: 261 lbs on 04/15/2015 Weight today: 242 lbs Weight change: 18 lbs  TANITA  BODY COMP RESULTS  07/03/15 08/08/15   BMI (kg/m^2) N/A 45.7   Fat Mass (lbs)  110   Fat Free Mass (lbs)  132   Total Body Water (lbs)  96.5    The following the learning objectives were met by the patient during this course:  Identifies Phase 3A (Soft, High Proteins) Dietary Goals and will begin from 2 weeks post-operatively to 2 months post-operatively  Identifies appropriate sources of fluids and proteins   States protein recommendations and appropriate sources post-operatively  Identifies the need for appropriate texture modifications, mastication, and bite sizes when consuming solids  Identifies appropriate multivitamin and calcium sources post-operatively  Describes the need for physical activity post-operatively and will follow MD recommendations  States when to call healthcare provider regarding medication questions or post-operative complications  Handouts given during class include:  Phase 3A: Soft, High Protein Diet Handout  Follow-Up Plan: Patient will follow-up at [facility] in 4 weeks for 6 week post-op nutrition visit for diet advancement per MD.

## 2015-09-28 ENCOUNTER — Ambulatory Visit: Payer: Self-pay | Admitting: Dietician

## 2016-10-25 ENCOUNTER — Ambulatory Visit (INDEPENDENT_AMBULATORY_CARE_PROVIDER_SITE_OTHER): Payer: 59 | Admitting: Advanced Practice Midwife

## 2016-10-25 ENCOUNTER — Encounter: Payer: Self-pay | Admitting: Advanced Practice Midwife

## 2016-10-25 VITALS — BP 118/78 | Ht 61.0 in | Wt 188.0 lb

## 2016-10-25 DIAGNOSIS — B009 Herpesviral infection, unspecified: Secondary | ICD-10-CM

## 2016-10-25 DIAGNOSIS — Z01419 Encounter for gynecological examination (general) (routine) without abnormal findings: Secondary | ICD-10-CM | POA: Diagnosis not present

## 2016-10-25 DIAGNOSIS — Z113 Encounter for screening for infections with a predominantly sexual mode of transmission: Secondary | ICD-10-CM

## 2016-10-25 DIAGNOSIS — Z124 Encounter for screening for malignant neoplasm of cervix: Secondary | ICD-10-CM | POA: Diagnosis not present

## 2016-10-25 DIAGNOSIS — Z8619 Personal history of other infectious and parasitic diseases: Secondary | ICD-10-CM | POA: Insufficient documentation

## 2016-10-25 DIAGNOSIS — T8332XA Displacement of intrauterine contraceptive device, initial encounter: Secondary | ICD-10-CM

## 2016-10-25 MED ORDER — VALACYCLOVIR HCL 500 MG PO TABS: 500.0000 mg | ORAL_TABLET | Freq: Two times a day (BID) | ORAL | 6 refills | Status: AC

## 2016-10-25 NOTE — Progress Notes (Signed)
Patient ID: Nancy Owen, female   DOB: [birth year], 31 y.o.   MRN: [id number]     Gynecology Annual Exam  PCP: Arnette Norris, MD  Chief Complaint:  Chief Complaint  Patient presents with  . Annual Exam    History of Present Illness: Patient is a 31 y.o. G2P1011 presents for annual exam. The patient has no complaints today.   LMP: Patient's last menstrual period was 10/21/2016. Average Interval: regular, 28 days Duration of flow: 6 days Heavy Menses: no Clots: no Intermenstrual Bleeding: no Postcoital Bleeding: no Dysmenorrhea: no    The patient is sexually active. She currently uses Mirena IUD for contraception. She denies dyspareunia.  The patient does occasionally perform self breast exams.  There is no notable family history of breast or ovarian cancer in her family.  The patient wears seatbelts: yes.   The patient has regular exercise: yes.    The patient denies current symptoms of depression.    Review of Systems: Review of Systems  Constitutional: Negative.   HENT: Negative.   Eyes: Negative.   Respiratory: Negative.   Cardiovascular: Negative.   Gastrointestinal: Negative.   Genitourinary: Negative.   Musculoskeletal: Negative.   Skin: Negative.   Neurological: Negative.   Endo/Heme/Allergies: Negative.   Psychiatric/Behavioral: Negative.     Past Medical History:  Past Medical History:  Diagnosis Date  . Allergic rhinitis   . GERD (gastroesophageal reflux disease)   . History of hiatal hernia   . History of syphilis   . Obesity   . Sleep apnea     Past Surgical History:  Past Surgical History:  Procedure Laterality Date  . FRACTURE SURGERY  age 69   L left  . LAPAROSCOPIC GASTRIC SLEEVE RESECTION N/A 07/25/2015   Procedure: LAPAROSCOPIC GASTRIC SLEEVE RESECTION;  Surgeon: Excell Seltzer, MD;  Location: [facility];  Service: General;  Laterality: N/A;    Gynecologic History:  Patient's last menstrual period was 10/21/2016. Contraception:  IUD Last Pap: Results were: no abnormalities   Obstetric History: G2P1011  Family History:  History reviewed. No pertinent family history.  Social History:  Social History   Social History  . Marital status: Married    Spouse name: N/A  . Number of children: 0  . Years of education: N/A   Occupational History  . AR specialist- [employer]   Social History Main Topics  . Smoking status: Never Smoker  . Smokeless tobacco: Never Used  . Alcohol use 0.0 oz/week     Comment: not much now, just at holidays wine cooler  . Drug use: No  . Sexual activity: Yes    Birth control/ protection: IUD   Other Topics Concern  . Not on file   Social History Narrative   Lives with mother, brother, and step father    Allergies:  No Known Allergies  Medications: Prior to Admission medications   Medication Sig Start Date End Date Taking? Authorizing Provider  levonorgestrel (MIRENA) 20 MCG/24HR IUD 1 each by Intrauterine route once. 03/20/14 03/21/19 Yes Historical Provider, MD  valACYclovir (VALTREX) 1000 MG tablet Take by mouth. 09/27/16 09/27/17 Yes Historical Provider, MD  phentermine 37.5 MG capsule Take 37.5 mg by mouth daily.     Historical Provider, MD    Physical Exam Vitals: Blood pressure 118/78, height 5\' 1"  (1.549 m), weight 188 lb (85.3 kg), last menstrual period 10/21/2016.  General: NAD HEENT: normocephalic, anicteric Thyroid: no enlargement, no palpable nodules Pulmonary: No increased work of breathing, CTAB Cardiovascular: RRR,  distal pulses 2+ Breast: Breast symmetrical, no tenderness, no palpable nodules or masses, no skin or nipple retraction present, no nipple discharge.  No axillary or supraclavicular lymphadenopathy. Abdomen: NABS, soft, non-tender, non-distended.  Umbilicus without lesions.  No hepatomegaly, splenomegaly or masses palpable. No evidence of hernia  Genitourinary:  External: Normal external female genitalia.  Normal urethral meatus, normal    Bartholin's and Skene's glands.    Vagina: Normal vaginal mucosa, no evidence of prolapse.    Cervix: Grossly normal in appearance, bleeding from period, no cmt, strings not visualized  Uterus: Non-enlarged, mobile, normal contour.    Adnexa: ovaries non-enlarged, no adnexal masses  Rectal: deferred  Lymphatic: no evidence of inguinal lymphadenopathy Extremities: no edema, erythema, or tenderness Neurologic: Grossly intact Psychiatric: mood appropriate, affect full   Assessment: 31 y.o. G2P1011 Well Woman exam with PAP smear/STD testing   Plan: Problem List Items Addressed This Visit    None    Visit Diagnoses    Well woman exam with routine gynecological exam       Relevant Orders   IGP,CtNgTv,rfx Aptima HPV ASCU   Screen for sexually transmitted diseases       Relevant Orders   IGP,CtNgTv,rfx Aptima HPV ASCU   Cervical cancer screening       Relevant Orders   IGP,CtNgTv,rfx Aptima HPV ASCU      1) STI screening was offered and accepted  2) ASCCP guidelines and rational discussed.  Patient opts for yearly screening interval  3) Contraception - Pt will schedule u/s to check placement of IUD since strings were not visualized  4) Continue healthy lifestyle diet and exercise  5) Follow up 1 year for routine annual exam   Rod Can, CNM

## 2016-10-29 LAB — RPR+HSVIGM+HBSAG+HSV2(IGG)+...
HEP B S AG: NEGATIVE
HIV Screen 4th Generation wRfx: NONREACTIVE
HSV 2 GLYCOPROTEIN G AB, IGG: 7.2 {index} — AB (ref 0.00–0.90)
HSVI/II COMB AB IGM: 1.8 ratio — AB (ref 0.00–0.90)
RPR Ser Ql: REACTIVE — AB

## 2016-10-29 LAB — RPR, QUANT. (REFLEX): Rapid Plasma Reagin, Quant: 1:2 {titer} — ABNORMAL HIGH

## 2016-10-31 LAB — IGP,CTNGTV,RFX APTIMA HPV ASCU
Chlamydia, Nuc. Acid Amp: NEGATIVE
Gonococcus, Nuc. Acid Amp: NEGATIVE
PAP SMEAR COMMENT: 0
Trich vag by NAA: NEGATIVE

## 2016-11-08 ENCOUNTER — Telehealth: Payer: Self-pay | Admitting: Advanced Practice Midwife

## 2016-11-08 ENCOUNTER — Encounter: Payer: Self-pay | Admitting: Advanced Practice Midwife

## 2016-11-08 ENCOUNTER — Ambulatory Visit (INDEPENDENT_AMBULATORY_CARE_PROVIDER_SITE_OTHER): Payer: 59

## 2016-11-08 ENCOUNTER — Ambulatory Visit (INDEPENDENT_AMBULATORY_CARE_PROVIDER_SITE_OTHER): Payer: 59 | Admitting: Advanced Practice Midwife

## 2016-11-08 VITALS — BP 100/70 | HR 90 | Ht 61.0 in | Wt 189.0 lb

## 2016-11-08 DIAGNOSIS — Z3009 Encounter for other general counseling and advice on contraception: Secondary | ICD-10-CM | POA: Diagnosis not present

## 2016-11-08 DIAGNOSIS — T8332XA Displacement of intrauterine contraceptive device, initial encounter: Secondary | ICD-10-CM

## 2016-11-08 DIAGNOSIS — Z124 Encounter for screening for malignant neoplasm of cervix: Secondary | ICD-10-CM | POA: Diagnosis not present

## 2016-11-08 MED ORDER — MISOPROSTOL 200 MCG PO TABS: ORAL_TABLET | ORAL | 0 refills | Status: DC

## 2016-11-08 NOTE — Telephone Encounter (Signed)
Pt is coming in for IUD insertion with Jane on 11/18/16 @ 210 pm. Pt would like to get Mirena. Thanks TNP

## 2016-11-08 NOTE — Progress Notes (Signed)
S: Pt is here today for Gyn U/S to locate her IUD since her strings were not visible at annual exam 1 week ago. She is also here for repeat PAP smear due to last week's PAP returning with unsatisfactory specimen. The patient has no complaints. Given results of U/S and malposition of IUD patient would like to remove the IUD and replace it right away. Given unsuccessful attempt to replace IUD, patient would like to take a dose of cytotec and return for another attempt when she is on her cycle.   O:   Vital Signs: BP 100/70   Pulse 90   Ht 5\' 1"  (1.549 m)   Wt 189 lb (85.7 kg)   LMP 10/21/2016   BMI 35.71 kg/m  Constitutional: Well nourished, well developed female in no acute distress.  HEENT: normal Skin: Warm and dry.  Cardiovascular: Regular rate and rhythm.   Extremity: no evidence of DVT  Respiratory: Clear to auscultation bilateral. Normal respiratory effort Neuro: DTRs 2+, Cranial nerves grossly intact Psych: Alert and Oriented x3. No memory deficits. Normal mood and affect.  MS: normal gait, normal bilateral lower extremity ROM/strength/stability.  Pelvic exam:  is not limited by body habitus EGBUS: within normal limits Vagina: within normal limits and with normal mucosa, normal discharge blood in the vault Cervix: repeat PAP done, normal looking, IUD strings not visible Results for ANHAR, MCDERMOTT (MRN [id number]) as of 11/08/2016 17:15  Ref. Range 11/08/2016 09:30  US TRANSVAGINAL NON-OB Unknown Rpt  IUD is 2 cm below uterine fundus and due to pt report of regular monthly bleeding and malposition of IUD it is recommended to remove it. The IUD is removed intact after hooking the strings and grasping them with a ring forceps and pulling with slightly extra effort. Attempt to replace IUD unsuccessful after being unable to dilate cervix with smallest dilator. Pt tolerated procedure well.  A: Malpositioned IUD Removal of IUD Unsuccessful attempt to replace IUD  P: Repeat PAP  smear Follow up when on cycle to place new IUD Rx for OCPs for temporary birth control Cytotec 400 mcg 4-6 hours before placing IUD   Rod Can, CNM

## 2016-11-11 ENCOUNTER — Encounter: Payer: Self-pay | Admitting: Advanced Practice Midwife

## 2016-11-11 LAB — IGP,CTNGTV,RFX APTIMA HPV ASCU
CHLAMYDIA, NUC. ACID AMP: NEGATIVE
Gonococcus, Nuc. Acid Amp: NEGATIVE
PAP SMEAR COMMENT: 0
Trich vag by NAA: NEGATIVE

## 2016-11-11 NOTE — Telephone Encounter (Signed)
Pt is schedule for 11/14/16 for mirena insertion.

## 2016-11-11 NOTE — Telephone Encounter (Signed)
Noted. Will order to arrive by apt date/time. 

## 2016-11-12 NOTE — Telephone Encounter (Signed)
This encounter was created in error - please disregard.

## 2016-11-12 NOTE — Telephone Encounter (Signed)
Coralee Pesa Paschal routed conversation to You 21 hours ago (1:55 PM)    Audree Camel  (1:54 PM)     Pt is schedule for 11/14/16 for mirena insertion

## 2016-11-14 ENCOUNTER — Ambulatory Visit: Payer: 59 | Admitting: Obstetrics and Gynecology

## 2016-11-14 ENCOUNTER — Ambulatory Visit (INDEPENDENT_AMBULATORY_CARE_PROVIDER_SITE_OTHER): Payer: 59 | Admitting: Obstetrics and Gynecology

## 2016-11-14 ENCOUNTER — Encounter: Payer: Self-pay | Admitting: Obstetrics and Gynecology

## 2016-11-14 VITALS — BP 100/60 | Ht 61.0 in | Wt 159.0 lb

## 2016-11-14 DIAGNOSIS — Z30430 Encounter for insertion of intrauterine contraceptive device: Secondary | ICD-10-CM

## 2016-11-14 NOTE — Patient Instructions (Signed)
[facility] [phone]  Instructions after IUD insertion  Most women experience no significant problems after insertion of an IUD, however minor cramping and spotting for a few days is common. Cramps may be treated with ibuprofen 800mg every 8 hours or Tylenol 650 mg every 4 hours. Contact [facility] immediately if you experience any of the following symptoms during the next week: temperature >99.6 degrees, worsening pelvic pain, abdominal pain, fainting, unusually heavy vaginal bleeding, foul vaginal discharge, or if you think you have expelled the IUD.  Nothing inserted in the vagina for 48 hours. You will be scheduled for a follow up visit in approximately four weeks.  You should check monthly to be sure you can feel the IUD strings in the upper vagina. If you are having a monthly period, try to check after each period. If you cannot feel the IUD strings,  contact [facility] immediately so we can do an exam to determine if the IUD has been expelled.   Please use backup protection until we can confirm the IUD is in place.  Call [facility] if you are exposed to or diagnosed with a sexually transmitted infection, as we will need to discuss whether it is safe for you to continue using an IUD.  

## 2016-11-14 NOTE — Progress Notes (Signed)
   Chief Complaint  Patient presents with  . Contraception     IUD PROCEDURE NOTE:  Nancy Owen is a 31 y.o. G2P1011 here for Mirena  IUD insertion. No GYN concerns.  She has returned with her period and took cytotec this morning. She tried to have it replaced with Rod Can last wk without success.  BP 100/60   Ht 5\' 1"  (1.549 m)   Wt 159 lb (72.1 kg)   LMP 11/11/2016   BMI 30.04 kg/m   IUD Insertion Procedure Note Patient identified, informed consent performed, consent signed.   Discussed risks of irregular bleeding, cramping, infection, malpositioning or misplacement of the IUD outside the uterus which may require further procedure such as laparoscopy, risk of failure <1%. Time out was performed.    A bimanual exam showed the uterus to be anteverted.  Speculum placed in the vagina.  Cervix visualized.  Cleaned with Betadine x 2.  Grasped anteriorly with a single tooth tenaculum.  Uterus sounded to 7.0 cm.   IUD placed per manufacturer's recommendations.  Strings trimmed to 3 cm. Tenaculum was removed, good hemostasis noted.  Patient tolerated procedure well.   ASSESSMENT: IUD insertion Encounter for insertion of intrauterine contraceptive device (IUD)    Plan:  Patient was given post-procedure instructions.  She was advised to have backup contraception for one week.   Call if you are having increasing pain, cramps or bleeding or if you have a fever greater than 100.4 degrees F., shaking chills, nausea or vomiting. Patient was also asked to check IUD strings periodically and follow up in 4 weeks for IUD check.  Return in about 4 weeks (around 12/12/2016) for IUD check.  Alicia B. Copland, PA-C 11/14/2016 10:36 AM

## 2016-11-18 ENCOUNTER — Ambulatory Visit: Payer: 59 | Admitting: Advanced Practice Midwife

## 2016-12-12 ENCOUNTER — Ambulatory Visit (INDEPENDENT_AMBULATORY_CARE_PROVIDER_SITE_OTHER): Payer: 59 | Admitting: Obstetrics and Gynecology

## 2016-12-12 ENCOUNTER — Encounter: Payer: Self-pay | Admitting: Obstetrics and Gynecology

## 2016-12-12 VITALS — BP 124/86 | HR 89 | Ht 61.0 in | Wt 199.0 lb

## 2016-12-12 DIAGNOSIS — Z30431 Encounter for routine checking of intrauterine contraceptive device: Secondary | ICD-10-CM

## 2016-12-12 NOTE — Progress Notes (Signed)
    Chief Complaint  Patient presents with  . IUD string check    Light pink spotting     History of Present Illness:  Nancy Owen is a 31 y.o. that had a Mirena IUD placed approximately 1 month ago. Since that time, she deneis dyspareunia, pelvic pain, non-menstrual bleeding, vaginal d/c, heavy bleeding. She is starting her period now and has had her normal menstrual cramping. She is happy with IUD.  Review of Systems  Constitutional: Negative for fever.  Gastrointestinal: Negative for blood in stool, constipation, diarrhea, nausea and vomiting.  Genitourinary: Positive for vaginal bleeding. Negative for dyspareunia, dysuria, flank pain, frequency, hematuria, urgency, vaginal discharge and vaginal pain.  Musculoskeletal: Negative for back pain.  Skin: Negative for rash.    Physical Exam:  BP 124/86 (BP Location: Left Arm, Patient Position: Sitting, Cuff Size: Normal)   Pulse 89   Ht 5\' 1"  (1.549 m)   Wt 199 lb (90.3 kg)   BMI 37.60 kg/m  Body mass index is 37.6 kg/m.  Pelvic exam:  Two IUD strings present,  Seen/palpated in the cervical os. EGBUS, vaginal vault and cervix: within normal limits   Assessment:  Encounter for routine checking of intrauterine contraceptive device (IUD) - IUD in place. F/u prn.  IUD strings present in proper location; pt doing well  Plan: F/u if any signs of infection or can no longer feel the strings.   Destony Prevost B. Mikayah Joy, PA-C 12/12/2016 11:21 AM

## 2017-01-27 ENCOUNTER — Other Ambulatory Visit: Payer: Self-pay | Admitting: Advanced Practice Midwife

## 2017-01-27 ENCOUNTER — Telehealth: Payer: Self-pay | Admitting: Advanced Practice Midwife

## 2017-01-27 DIAGNOSIS — B009 Herpesviral infection, unspecified: Secondary | ICD-10-CM

## 2017-01-27 MED ORDER — VALACYCLOVIR HCL 1 G PO TABS: 1000.0000 mg | ORAL_TABLET | Freq: Every day | ORAL | 5 refills | Status: DC

## 2017-01-27 NOTE — Telephone Encounter (Signed)
Please advise 

## 2017-01-27 NOTE — Telephone Encounter (Signed)
Please call her to let her know the Rx was sent to pharm. Thank you

## 2017-01-27 NOTE — Telephone Encounter (Signed)
Pt contacted office for refill request on the following medications: valACYclovir (VALTREX) 1000 MG tablet [facility] [address] CB#[phone] Please advise. Thanks TNP

## 2017-01-28 NOTE — Telephone Encounter (Signed)
Left message to return call 

## 2017-02-19 ENCOUNTER — Encounter (HOSPITAL_COMMUNITY): Payer: Self-pay

## 2017-05-05 ENCOUNTER — Encounter: Payer: Self-pay | Admitting: Obstetrics and Gynecology

## 2017-05-05 ENCOUNTER — Ambulatory Visit (INDEPENDENT_AMBULATORY_CARE_PROVIDER_SITE_OTHER): Payer: 59 | Admitting: Obstetrics and Gynecology

## 2017-05-05 VITALS — BP 130/72 | HR 72 | Temp 98.1°F | Ht 61.0 in | Wt 201.0 lb

## 2017-05-05 DIAGNOSIS — N3001 Acute cystitis with hematuria: Secondary | ICD-10-CM | POA: Diagnosis not present

## 2017-05-05 DIAGNOSIS — R1032 Left lower quadrant pain: Secondary | ICD-10-CM

## 2017-05-05 DIAGNOSIS — N898 Other specified noninflammatory disorders of vagina: Secondary | ICD-10-CM

## 2017-05-05 DIAGNOSIS — R350 Frequency of micturition: Secondary | ICD-10-CM | POA: Diagnosis not present

## 2017-05-05 LAB — POCT URINALYSIS DIPSTICK
Bilirubin, UA: NEGATIVE
Glucose, UA: NEGATIVE
Ketones, UA: NEGATIVE
NITRITE UA: NEGATIVE
SPEC GRAV UA: 1.01 (ref 1.010–1.025)
pH, UA: 7.5 (ref 5.0–8.0)

## 2017-05-05 MED ORDER — NITROFURANTOIN MONOHYD MACRO 100 MG PO CAPS: 100.0000 mg | ORAL_CAPSULE | Freq: Two times a day (BID) | ORAL | 0 refills | Status: AC

## 2017-05-05 NOTE — Progress Notes (Signed)
Chief Complaint  Patient presents with  . Urinary Tract Infection    Urinary Frequency, Abdominal Pain x 3 days    HPI:      Ms. Nancy Owen is a 31 y.o. G2P1011 who LMP was No LMP recorded. Patient is not currently having periods (Reason: IUD)., presents today for UTI sx of dysuria, frequency, urgency, suprapubic pain for the past 3 days. No hematuria, LBP, fevers. Has noted vaginal itching without increased d/c, odor. Hx of HSV. Hx of UTI in distant past. Doing well with IUD.    Past Medical History:  Diagnosis Date  . Allergic rhinitis   . Encounter for insertion of mirena IUD   . GERD (gastroesophageal reflux disease)   . Gonorrhea   . Herpes genitalia   . High-risk pregnancy 01/17/2014   chorio/mild utering atony  . History of hiatal hernia   . History of syphilis   . Obesity   . Sleep apnea   . Syphilis     Past Surgical History:  Procedure Laterality Date  . FRACTURE SURGERY  2004   L left tibia  . LAPAROSCOPIC GASTRIC SLEEVE RESECTION N/A 07/25/2015   Procedure: LAPAROSCOPIC GASTRIC SLEEVE RESECTION;  Surgeon: Excell Seltzer, MD;  Location: [facility];  Service: General;  Laterality: N/A;    Family History  Problem Relation Age of Onset  . Hypertension Maternal Grandmother   . Diabetes Maternal Grandfather   . Hypertension Maternal Grandfather     Social History   Social History  . Marital status: Married    Spouse name: N/A  . Number of children: 0  . Years of education: N/A   Occupational History  . AR specialist- [employer]   Social History Main Topics  . Smoking status: Never Smoker  . Smokeless tobacco: Never Used  . Alcohol use 0.0 oz/week     Comment: not much now, just at holidays wine cooler  . Drug use: No  . Sexual activity: Yes    Birth control/ protection: IUD   Other Topics Concern  . Not on file   Social History Narrative   Lives with mother, brother, and step father     Current Outpatient Prescriptions:  .   levonorgestrel (MIRENA) 20 MCG/24HR IUD, 1 each by Intrauterine route once., Disp: , Rfl:  .  nitrofurantoin, macrocrystal-monohydrate, (MACROBID) 100 MG capsule, Take 1 capsule (100 mg total) by mouth 2 (two) times daily., Disp: 10 capsule, Rfl: 0 .  valACYclovir (VALTREX) 1000 MG tablet, Take 1 tablet (1,000 mg total) by mouth daily., Disp: 30 tablet, Rfl: 5   ROS:  Review of Systems  Constitutional: Negative for fever.  Gastrointestinal: Negative for blood in stool, constipation, diarrhea, nausea and vomiting.  Genitourinary: Positive for dysuria, frequency, pelvic pain and urgency. Negative for dyspareunia, flank pain, hematuria, vaginal bleeding, vaginal discharge and vaginal pain.  Musculoskeletal: Negative for back pain.  Skin: Negative for rash.     OBJECTIVE:   Vitals:  BP 130/72 (BP Location: Left Arm, Patient Position: Sitting, Cuff Size: Large)   Pulse 72   Temp 98.1 F (36.7 C)   Ht 5\' 1"  (1.549 m)   Wt 201 lb (91.2 kg)   BMI 37.98 kg/m   Physical Exam  Constitutional: She is oriented to person, place, and time and well-developed, well-nourished, and in no distress.  Abdominal: There is no CVA tenderness.  Genitourinary: Vagina normal and vulva normal. Vulva exhibits no erythema, no exudate, no lesion, no rash and no tenderness. Vagina  exhibits no lesion. No vaginal discharge found.  Neurological: She is alert and oriented to person, place, and time.  Psychiatric: Affect and judgment normal.  Vitals reviewed.   Results: Results for orders placed or performed in visit on 05/05/17 (from the past 24 hour(s))  POCT Urinalysis Dipstick     Status: Abnormal   Collection Time: 05/05/17 11:20 AM  Result Value Ref Range   Color, UA straw    Clarity, UA cloudy    Glucose, UA neg    Bilirubin, UA neg    Ketones, UA neg    Spec Grav, UA 1.010 1.010 - 1.025   Blood, UA mod    pH, UA 7.5 5.0 - 8.0   Protein, UA trace    Urobilinogen, UA  0.2 or 1.0 E.U./dL    Nitrite, UA neg    Leukocytes, UA Large (3+) (A) Negative     Assessment/Plan: Acute cystitis with hematuria - Rx macrobid. Check C&S. F/u prn.  - Plan: POCT Urinalysis Dipstick, Urine Culture, nitrofurantoin, macrocrystal-monohydrate, (MACROBID) 100 MG capsule  Urinary frequency - Plan: CANCELED: POCT Urinalysis Dipstick  Left lower quadrant pain - Plan: CANCELED: POCT Urinalysis Dipstick  Vaginal itching - Neg exam. Most likely from urethral irritation due to UTI. F/u prn.    Meds ordered this encounter  Medications  . nitrofurantoin, macrocrystal-monohydrate, (MACROBID) 100 MG capsule    Sig: Take 1 capsule (100 mg total) by mouth 2 (two) times daily.    Dispense:  10 capsule    Refill:  0      Return if symptoms worsen or fail to improve.  Bijou Easler B. Shuronda Santino, PA-C 05/05/2017 11:30 AM

## 2017-05-07 LAB — URINE CULTURE

## 2017-05-09 ENCOUNTER — Ambulatory Visit (INDEPENDENT_AMBULATORY_CARE_PROVIDER_SITE_OTHER): Payer: 59

## 2017-05-09 DIAGNOSIS — Z23 Encounter for immunization: Secondary | ICD-10-CM | POA: Diagnosis not present

## 2017-07-05 ENCOUNTER — Ambulatory Visit (INDEPENDENT_AMBULATORY_CARE_PROVIDER_SITE_OTHER): Payer: 59

## 2017-07-05 ENCOUNTER — Other Ambulatory Visit: Payer: Self-pay

## 2017-07-05 ENCOUNTER — Ambulatory Visit: Payer: 59

## 2017-07-05 ENCOUNTER — Ambulatory Visit
Admission: EM | Admit: 2017-07-05 | Discharge: 2017-07-05 | Disposition: A | Discharge status: Home / Self Care | Payer: 59 | Attending: Family Medicine | Admitting: Family Medicine

## 2017-07-05 ENCOUNTER — Encounter: Payer: Self-pay | Admitting: Emergency Medicine

## 2017-07-05 DIAGNOSIS — M5414 Radiculopathy, thoracic region: Secondary | ICD-10-CM | POA: Diagnosis not present

## 2017-07-05 MED ORDER — NAPROXEN 500 MG PO TABS
500.0000 mg | ORAL_TABLET | Freq: Two times a day (BID) | ORAL | 0 refills | Status: DC
Start: 2017-07-05 — End: 2019-05-07

## 2017-07-05 MED ORDER — KETOROLAC TROMETHAMINE 60 MG/2ML IM SOLN
60.0000 mg | Freq: Once | INTRAMUSCULAR | Status: AC
  Administered 2017-07-05: 60 mg via INTRAMUSCULAR

## 2017-07-05 MED ORDER — METAXALONE 800 MG PO TABS: 800.0000 mg | ORAL_TABLET | Freq: Three times a day (TID) | ORAL | 0 refills | Status: DC

## 2017-07-05 NOTE — ED Triage Notes (Signed)
Patient c/o upper back pain that started on Thursday and has progressively gotten worse.  Patient reports SOB that started this morning.  Patient also reports pain is worse when she takes a deep breath.

## 2017-07-05 NOTE — ED Provider Notes (Signed)
[facility]    CSN: [id number] Arrival date & time: 07/05/17  8366     History   Chief Complaint Chief Complaint  Patient presents with  . Back Pain    HPI Nancy Owen is a 31 y.o. female.   HPI  31 year old female who presents with upper back pain started on Thursday and is progressively worsened.  She states that she spent 9 hours in a meeting Wednesday the day prior to the onset of her symptoms.  At that time she sat in an uncomfortable chair and listen to lectures and participated on computer.  Following morning she awoke and had pain in her upper thoracic area along the left medial scapular border.  She is also noticed that when she takes in a deep breath she has pain in that area as well and over her anterior chest wall She also complains of tightness in her chest when she takes a deep breath.  She does not have it other than when she takes deep breaths.  She had no fever or chills.  Her O2 sats are 100% on room air.         Past Medical History:  Diagnosis Date  . Allergic rhinitis   . Encounter for insertion of mirena IUD   . GERD (gastroesophageal reflux disease)   . Gonorrhea   . Herpes genitalia   . High-risk pregnancy 01/17/2014   chorio/mild utering atony  . History of hiatal hernia   . History of syphilis   . Obesity   . Sleep apnea   . Syphilis     Patient Active Problem List   Diagnosis Date Noted  . History of chlamydia 10/25/2016  . Morbid obesity with body mass index of 40.0-49.9 ([place]) 07/25/2015  . Well woman exam 01/03/2015  . OSA (obstructive sleep apnea) 08/02/2011  . Insomnia 08/02/2011  . Obesity 08/02/2011    Past Surgical History:  Procedure Laterality Date  . FRACTURE SURGERY  2004   L left tibia  . LAPAROSCOPIC GASTRIC SLEEVE RESECTION N/A 07/25/2015   Procedure: LAPAROSCOPIC GASTRIC SLEEVE RESECTION;  Surgeon: Excell Seltzer, MD;  Location: [facility];  Service: General;  Laterality: N/A;    OB History    Gravida Para Term Preterm AB Living   2 1 1   1 1    SAB TAB Ectopic Multiple Live Births     1     1       Home Medications    Prior to Admission medications   Medication Sig Start Date End Date Taking? Authorizing Provider  levonorgestrel (MIRENA) 20 MCG/24HR IUD 1 each by Intrauterine route once. 03/20/14 03/21/19  [provider]  metaxalone (SKELAXIN) 800 MG tablet Take 1 tablet (800 mg total) by mouth 3 (three) times daily. 07/05/17   Lorin Picket, PA-C  naproxen (NAPROSYN) 500 MG tablet Take 1 tablet (500 mg total) by mouth 2 (two) times daily with a meal. 07/05/17   Lorin Picket, PA-C  valACYclovir (VALTREX) 1000 MG tablet Take 1 tablet (1,000 mg total) by mouth daily. 01/27/17 01/27/18  Rod Can, CNM    Family History Family History  Problem Relation Age of Onset  . Hypertension Maternal Grandmother   . Diabetes Maternal Grandfather   . Hypertension Maternal Grandfather     Social History Social History   Tobacco Use  . Smoking status: Never Smoker  . Smokeless tobacco: Never Used  Substance Use Topics  . Alcohol use: Yes    Alcohol/week:  0.0 oz    Comment: not much now, just at holidays wine cooler  . Drug use: No     Allergies   Patient has no known allergies.   Review of Systems Review of Systems  Constitutional: Positive for activity change. Negative for chills, diaphoresis, fatigue and fever.  Respiratory: Positive for chest tightness and shortness of breath.   Musculoskeletal: Positive for neck pain and neck stiffness.  All other systems reviewed and are negative.    Physical Exam Triage Vital Signs ED Triage Vitals  Enc Vitals Group     BP 07/05/17 0829 109/78     Pulse Rate 07/05/17 0829 91     Resp 07/05/17 0829 16     Temp 07/05/17 0829 98.2 F (36.8 C)     Temp Source 07/05/17 0829 Oral     SpO2 07/05/17 0829 100 %     Weight 07/05/17 0828 198 lb (89.8 kg)     Height 07/05/17 0828 5\' 1"  (1.549 m)     Head  Circumference --      Peak Flow --      Pain Score 07/05/17 0828 9     Pain Loc --      Pain Edu? --      Excl. in [place]? --    No data found.  Updated Vital Signs BP 109/78 (BP Location: Left Arm)   Pulse 91   Temp 98.2 F (36.8 C) (Oral)   Resp 16   Ht 5\' 1"  (1.549 m)   Wt 198 lb (89.8 kg)   LMP 07/04/2017 Comment: denies preg  SpO2 100%   BMI 37.41 kg/m   Visual Acuity Right Eye Distance:   Left Eye Distance:   Bilateral Distance:    Right Eye Near:   Left Eye Near:    Bilateral Near:     Physical Exam  Constitutional: She appears well-developed and well-nourished. No distress.  HENT:  Head: Normocephalic.  Eyes: Pupils are equal, round, and reactive to light.  Neck: Normal range of motion.  Pulmonary/Chest: Effort normal and breath sounds normal.  Patient tends to not want to fully inspire rate complaining of pain along the medial sternal border and chest tightness.  There is no rales rhonchi or wheezing present.  Musculoskeletal:  Examination of the cervical spine shows a decreased range of motion particularly with flexion and with lateral flexion and extension bilaterally.  This produces pain in the neck and along the left medial scapular border.  Tenderness palpation in the muscles along the pes is extending into the paraspinous muscles adjacent to the medial scapular border.  Upper extremity strength and sensation is intact. Neck small leg circumference is 18 inches on the left and 17-1/2 inches on the right should be noted that she has had previous surgery with a rod placed in her tibia on the left.  Skin: She is not diaphoretic.  Nursing note and vitals reviewed.    UC Treatments / Results  Labs (all labs ordered are listed, but only abnormal results are displayed) Labs Reviewed - No data to display  EKG  EKG Interpretation None       Radiology Dg Chest 2 View  Result Date: 07/05/2017 CLINICAL DATA:  Chest pain. EXAM: CHEST  2 VIEW COMPARISON:   Radiographs of July 07, 2015. FINDINGS: The heart size and mediastinal contours are within normal limits. Both lungs are clear. No pneumothorax or pleural effusion is noted. The visualized skeletal structures are unremarkable. IMPRESSION: No active cardiopulmonary  disease. Electronically Signed   By: Marijo Conception, M.D.   On: 07/05/2017 09:12    Procedures Procedures (including critical care time)  Medications Ordered in UC Medications  ketorolac (TORADOL) injection 60 mg (60 mg Intramuscular Given 07/05/17 0930)     Initial Impression / Assessment and Plan / UC Course  I have reviewed the triage vital signs and the nursing notes.  Pertinent labs & imaging results that were available during my care of the patient were reviewed by me and considered in my medical decision making (see chart for details).     Plan: 1. Test/x-ray results and diagnosis reviewed with patient 2. rx as per orders; risks, benefits, potential side effects reviewed with patient 3. Recommend supportive treatment with rest and symptom avoidance.  Use ice on the area on her back minutes 2 hours 4-5 times daily.  He is cautioned not to drive while taking the Skelaxin or perform activities requiring concentration or judgment.  She will take the Naprosyn with food.  I told her that if she has any change in her breathing or character of pain she should go immediately to the emergency room.  This was stressed to her and her husband.  It is very unlikely that she has a PE but that is part of the differential.  I have also recommend that she follow-up with her primary care physician continues to have discomfort next week. 4. F/u prn if symptoms worsen or don't improve   Final Clinical Impressions(s) / UC Diagnoses   Final diagnoses:  Thoracic radiculitis    ED Discharge Orders        Ordered    metaxalone (SKELAXIN) 800 MG tablet  3 times daily     07/05/17 0937    naproxen (NAPROSYN) 500 MG tablet  2 times daily  with meals     07/05/17 0937       Controlled Substance Prescriptions [place] Controlled Substance Registry consulted? Not Applicable   Lorin Picket, PA-C 07/05/17 [id number]

## 2017-07-05 NOTE — Discharge Instructions (Signed)
Ice 20 minutes out of every 2 hours 4-5 times daily.  Be sure to take Naprosyn with food.  Do not drive while taking Skelaxin

## 2017-10-24 ENCOUNTER — Other Ambulatory Visit: Payer: Self-pay | Admitting: Advanced Practice Midwife

## 2017-10-24 ENCOUNTER — Telehealth: Payer: Self-pay | Admitting: Advanced Practice Midwife

## 2017-10-24 DIAGNOSIS — B009 Herpesviral infection, unspecified: Secondary | ICD-10-CM

## 2017-10-24 MED ORDER — VALACYCLOVIR HCL 1 G PO TABS: 1000.0000 mg | ORAL_TABLET | Freq: Every day | ORAL | 5 refills | Status: AC

## 2017-10-24 NOTE — Telephone Encounter (Signed)
Rx sent to patient pharmacy.

## 2017-10-24 NOTE — Progress Notes (Signed)
Rx valtrex sent to patient pharmacy

## 2017-10-24 NOTE — Telephone Encounter (Signed)
Patient is schedule for annual 11/07/17. Patient is requesting refill on her Herpes out break medication due to currently having one. Please advise

## 2017-11-07 ENCOUNTER — Ambulatory Visit (INDEPENDENT_AMBULATORY_CARE_PROVIDER_SITE_OTHER): Payer: 59 | Admitting: Advanced Practice Midwife

## 2017-11-07 ENCOUNTER — Encounter: Payer: Self-pay | Admitting: Advanced Practice Midwife

## 2017-11-07 ENCOUNTER — Ambulatory Visit (INDEPENDENT_AMBULATORY_CARE_PROVIDER_SITE_OTHER): Payer: BLUE CROSS/BLUE SHIELD

## 2017-11-07 VITALS — BP 110/70 | HR 83 | Ht 61.0 in | Wt 215.0 lb

## 2017-11-07 DIAGNOSIS — Z124 Encounter for screening for malignant neoplasm of cervix: Secondary | ICD-10-CM | POA: Diagnosis not present

## 2017-11-07 DIAGNOSIS — Z01419 Encounter for gynecological examination (general) (routine) without abnormal findings: Secondary | ICD-10-CM

## 2017-11-07 DIAGNOSIS — Z113 Encounter for screening for infections with a predominantly sexual mode of transmission: Secondary | ICD-10-CM

## 2017-11-07 DIAGNOSIS — T8332XD Displacement of intrauterine contraceptive device, subsequent encounter: Secondary | ICD-10-CM

## 2017-11-07 NOTE — Progress Notes (Signed)
Patient ID: Nancy Owen, female   DOB: [birth year], 32 y.o.   MRN: [id number]      Gynecology Annual Exam   PCP: Lucille Passy, MD  Chief Complaint:  Chief Complaint  Patient presents with  . Gynecologic Exam    History of Present Illness: Patient is a 32 y.o. G2P1011 presents for annual exam. The patient has no complaints today She requests STD testing. Specifically she wants to know the status of her syphilis.    LMP: Patient's last menstrual period was 10/23/2017. Average Interval: regular, 28 days Duration of flow: 7 days Heavy Menses: no Clots: no Intermenstrual Bleeding: no Postcoital Bleeding: no Dysmenorrhea: no  The patient is sexually active. She currently uses IUD for contraception. She denies dyspareunia.  The patient does occasionally perform self breast exams.  There is no notable family history of breast or ovarian cancer in her family.  The patient wears seatbelts: yes.   The patient has regular exercise: yes.    The patient denies current symptoms of depression.    Review of Systems: Review of Systems  Constitutional: Negative.   HENT: Negative.   Eyes: Negative.   Respiratory: Negative.   Cardiovascular: Negative.   Gastrointestinal: Negative.   Genitourinary: Negative.   Musculoskeletal: Negative.   Skin: Negative.   Neurological: Negative.   Endo/Heme/Allergies: Negative.   Psychiatric/Behavioral: Negative.     Past Medical History:  Past Medical History:  Diagnosis Date  . Allergic rhinitis   . Encounter for insertion of mirena IUD   . GERD (gastroesophageal reflux disease)   . Gonorrhea   . Herpes genitalia   . High-risk pregnancy 01/17/2014   chorio/mild utering atony  . History of hiatal hernia   . History of syphilis   . Obesity   . Sleep apnea   . Syphilis     Past Surgical History:  Past Surgical History:  Procedure Laterality Date  . FRACTURE SURGERY  2004   L left tibia  . LAPAROSCOPIC GASTRIC SLEEVE RESECTION N/A  07/25/2015   Procedure: LAPAROSCOPIC GASTRIC SLEEVE RESECTION;  Surgeon: Excell Seltzer, MD;  Location: [facility];  Service: General;  Laterality: N/A;    Gynecologic History:  Patient's last menstrual period was 10/23/2017. Contraception: IUD Last Pap: 1 year ago Results were: no abnormalities   Obstetric History: G2P1011  Family History:  Family History  Problem Relation Age of Onset  . Hypertension Maternal Grandmother   . Diabetes Maternal Grandfather   . Hypertension Maternal Grandfather     Social History:  Social History   Socioeconomic History  . Marital status: Married    Spouse name: Not on file  . Number of children: 0  . Years of education: Not on file  . Highest education level: Not on file  Occupational History  . Occupation: AR specialist- Sport and exercise psychologist: [employer]  . Financial resource strain: Not on file  . Food insecurity:    Worry: Not on file    Inability: Not on file  . Transportation needs:    Medical: Not on file    Non-medical: Not on file  Tobacco Use  . Smoking status: Never Smoker  . Smokeless tobacco: Never Used  Substance and Sexual Activity  . Alcohol use: Yes    Alcohol/week: 0.0 oz    Comment: not much now, just at holidays wine cooler  . Drug use: No  . Sexual activity: Yes    Birth control/protection: IUD  Lifestyle  . Physical  activity:    Days per week: Not on file    Minutes per session: Not on file  . Stress: Not on file  Relationships  . Social connections:    Talks on phone: Not on file    Gets together: Not on file    Attends religious service: Not on file    Active member of club or organization: Not on file    Attends meetings of clubs or organizations: Not on file    Relationship status: Not on file  . Intimate partner violence:    Fear of current or ex partner: Not on file    Emotionally abused: Not on file    Physically abused: Not on file    Forced sexual activity: Not on file  Other  Topics Concern  . Not on file  Social History Narrative   Lives with mother, brother, and step father    Allergies:  No Known Allergies  Medications: Prior to Admission medications   Medication Sig Start Date End Date Taking? Authorizing Provider  levonorgestrel (MIRENA) 20 MCG/24HR IUD 1 each by Intrauterine route once. 03/20/14 03/21/19 Yes [provider]  valACYclovir (VALTREX) 1000 MG tablet Take 1 tablet (1,000 mg total) by mouth daily. 10/24/17 10/24/18 Yes Rod Can, CNM  metaxalone (SKELAXIN) 800 MG tablet Take 1 tablet (800 mg total) by mouth 3 (three) times daily. Patient not taking: Reported on 11/07/2017 07/05/17   Lorin Picket, PA-C  naproxen (NAPROSYN) 500 MG tablet Take 1 tablet (500 mg total) by mouth 2 (two) times daily with a meal. Patient not taking: Reported on 11/07/2017 07/05/17   Lorin Picket, PA-C    Physical Exam Vitals: Blood pressure 110/70, pulse 83, height 5\' 1"  (1.549 m), weight 215 lb (97.5 kg), last menstrual period 10/23/2017.  General: NAD HEENT: normocephalic, anicteric Thyroid: no enlargement, no palpable nodules Pulmonary: No increased work of breathing, CTAB Cardiovascular: RRR, distal pulses 2+ Breast: Breast symmetrical, no tenderness, no palpable nodules or masses, no skin or nipple retraction present, no nipple discharge.  No axillary or supraclavicular lymphadenopathy. Abdomen: NABS, soft, non-tender, non-distended.  Umbilicus without lesions.  No hepatomegaly, splenomegaly or masses palpable. No evidence of hernia  Genitourinary:  External: Normal external female genitalia.  Normal urethral meatus, normal Bartholin's and Skene's glands.    Vagina: Normal vaginal mucosa, no evidence of prolapse.    Cervix: Grossly normal in appearance, no bleeding, no CMT, strings not visualized  Uterus: deferred for no concerns/shared decision making  Adnexa: deferred for no concerns/shared decision making  Rectal: deferred  Lymphatic:  no evidence of inguinal lymphadenopathy Extremities: no edema, erythema, or tenderness Neurologic: Grossly intact Psychiatric: mood appropriate, affect full  Patient Name: Nancy Owen DOB: Mar 30, 1986 MRN: [id number]  ULTRASOUND REPORT  Location: [facility]  Date of Service: 11/07/2017    Indications:IUD LOCATION Findings:  The uterus is anteverted and measures 9.35 X 5.28 X 4.26 CM. Echo texture is homogenous without evidence of focal masses.  The Endometrium measures 4.04 mm. -- IUD SEEN IN PROPER LOCATION.  Right Ovary measures 5.29 X 4.97 X 3.10 cm. It contain simple cyst measures 34 x 23 x 32 mm. Left Ovary measures 2.00 x 2.11 x 1.78 cm. It is normal in appearance. Survey of the adnexa demonstrates no adnexal masses. There is no free fluid in the cul de sac.   Assessment: 32 y.o. G2P1011 routine annual exam  Plan: Problem List Items Addressed This Visit    None    Visit  Diagnoses    Well woman exam with routine gynecological exam    -  Primary   Relevant Orders   IGP,CtNgTv,rfx Aptima HPV ASCU   Screen for sexually transmitted diseases       Relevant Orders   IGP,CtNgTv,rfx Aptima HPV ASCU   Hepatitis B surface antibody   Hepatitis C antibody   HIV antibody   RPR Qual   Cervical cancer screening       Relevant Orders   IGP,CtNgTv,rfx Aptima HPV ASCU   Intrauterine contraceptive device threads lost, subsequent encounter       Relevant Orders   US PELVIS TRANSVANGINAL NON-OB (TV ONLY) (Completed)      2) STI screening  was offered and accepted  2)  ASCCP guidelines and rational discussed.  Patient opts for yearly screening interval  3) Contraception - the patient is currently using  IUD.  She is happy with her current form of contraception and plans to continue  4) Routine healthcare maintenance including cholesterol, diabetes screening discussed Declines  5) Return in 1 year (on 11/08/2018) for annual established gyn.   Rod Can,  [facility], [facility] 11/07/2017, 4:16 PM

## 2017-11-08 LAB — RPR, QUANT. (REFLEX)

## 2017-11-08 LAB — RPR QUALITATIVE: RPR Ser Ql: REACTIVE — AB

## 2017-11-08 LAB — HIV ANTIBODY (ROUTINE TESTING W REFLEX): HIV SCREEN 4TH GENERATION: NONREACTIVE

## 2017-11-08 LAB — HEPATITIS B SURFACE ANTIBODY,QUALITATIVE: HEP B SURFACE AB, QUAL: REACTIVE

## 2017-11-08 LAB — HEPATITIS C ANTIBODY

## 2017-11-11 LAB — IGP,CTNGTV,RFX APTIMA HPV ASCU
CHLAMYDIA, NUC. ACID AMP: NEGATIVE
Gonococcus, Nuc. Acid Amp: NEGATIVE
PAP SMEAR COMMENT: 0
TRICH VAG BY NAA: NEGATIVE

## 2019-02-10 ENCOUNTER — Other Ambulatory Visit: Payer: Self-pay

## 2019-02-10 DIAGNOSIS — Z20822 Contact with and (suspected) exposure to covid-19: Secondary | ICD-10-CM

## 2019-02-11 LAB — NOVEL CORONAVIRUS, NAA: SARS-CoV-2, NAA: NOT DETECTED

## 2019-03-26 ENCOUNTER — Other Ambulatory Visit: Payer: Self-pay

## 2019-03-26 DIAGNOSIS — Z20822 Contact with and (suspected) exposure to covid-19: Secondary | ICD-10-CM

## 2019-03-28 LAB — NOVEL CORONAVIRUS, NAA: SARS-CoV-2, NAA: NOT DETECTED

## 2019-05-06 NOTE — Progress Notes (Signed)
Patient ID: Nancy Owen, female   DOB: October 08, 1985, 33 y.o.   MRN: [id number]      Gynecology Annual Exam   PCP: Lucille Passy, MD  Chief Complaint:  Chief Complaint  Patient presents with  . Gynecologic Exam    Remove IUD; blood test - STD's to see where her syphylis number is at;  back pain for awhile now    History of Present Illness: Nancy Owen is a 33 y.o. G2P1011 BF who presents for her annual exam.  She requests STD testing. Specifically she wants to know the status of her syphilis.  She was treated for syphilis in 2011 and 2013. In addition she would like her IUD removed. At her last annual exam last year, her IUD strings were not visible and a pelvic ultrasound was done. Her IUD was in the correct position. She is interested in a short term contraceptive, as she is considering conceiving in the next year. She also complains of a vaginal odor, intermittently for months. No vulvar itching or irritation.  LMP: Patient's last menstrual period was 04/24/2019 (exact date). Average Interval: regular, 28 days Duration of flow:6- 7 days Heavy Menses: no Clots: no Intermenstrual Bleeding: no Postcoital Bleeding: no Dysmenorrhea: on the day menses starts, no meds required  Last Pap smear: 11/07/2017: NIL Past medical history is remarkable for HSV II, syphilis, gonorrhea and Chlamydia as well as morbid obesity. She has had a gastric sleeve procedure in 2017. She has not returned to her surgeon for follow up recently. She is taking Flintstone vitamins, vitamins B12, and calcium citrate.  Since her last annual exam, she has gained 41#. She also has been having thoracic back pain and is using [facility] and pain patches and ice. She feels the pain is due to her large breasts. No trauma or falls.   The patient is sexually active. She currently uses IUD for contraception. She denies dyspareunia.  The patient does occasionally perform self breast exams.  There is no notable family  history of breast or ovarian cancer in her family.   The patient has regular exercise: just recently started exercising 2x/week doing Zumba.   The patient denies current symptoms of depression.    Review of Systems: Review of Systems  Constitutional: Negative.   HENT: Negative.   Eyes: Negative.   Respiratory: Negative.   Cardiovascular: Negative.   Gastrointestinal: Negative.   Genitourinary: Negative.   Musculoskeletal: Negative.   Skin: Negative.   Neurological: Negative.   Endo/Heme/Allergies: Negative.   Psychiatric/Behavioral: Negative.     Past Medical History:  Past Medical History:  Diagnosis Date  . Allergic rhinitis   . Encounter for insertion of mirena IUD   . GERD (gastroesophageal reflux disease)   . Gonorrhea   . Herpes genitalia   . High-risk pregnancy 01/17/2014   chorio/mild utering atony  . History of hiatal hernia   . History of syphilis   . Obesity   . Sleep apnea   . Syphilis     Past Surgical History:  Past Surgical History:  Procedure Laterality Date  . FRACTURE SURGERY  2004   L left tibia  . LAPAROSCOPIC GASTRIC SLEEVE RESECTION N/A 07/25/2015   Procedure: LAPAROSCOPIC GASTRIC SLEEVE RESECTION;  Surgeon: Excell Seltzer, MD;  Location: [facility];  Service: General;  Laterality: N/A;    Gynecologic History:  Patient's last menstrual period was 04/24/2019 (exact date). Contraception: IUD Last Pap: 1 year ago Results were: no abnormalities   Obstetric History: [id number]  Family History:  Family History  Problem Relation Age of Onset  . Hypertension Maternal Grandmother   . Diabetes Maternal Grandfather   . Hypertension Maternal Grandfather     Social History:  Social History   Socioeconomic History  . Marital status: Married    Spouse name: Not on file  . Number of children: 1  . Years of education: Not on file  . Highest education level: Not on file  Occupational History  . 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 Financial resource strain: Not on file  . Food insecurity    Worry: Not on file    Inability: Not on file  . Transportation needs    Medical: Not on file    Non-medical: Not on file  Tobacco Use  . Smoking status: Never Smoker  . Smokeless tobacco: Never Used  Substance and Sexual Activity  . Alcohol use: Yes    Alcohol/week: 0.0 standard drinks    Comment: not much now, just at holidays wine cooler  . Drug use: No  . Sexual activity: Yes  Lifestyle  . Physical activity    Days per week: Not on file    Minutes per session: Not on file  . Stress: Not on file  Relationships  . Social Herbalist on phone: Not on file    Gets together: Not on file    Attends religious service: Not on file    Active member of club or organization: Not on file    Attends meetings of clubs or organizations: Not on file    Relationship status: Not on file  . Intimate partner violence    Fear of current or ex partner: Not on file    Emotionally abused: Not on file    Physically abused: Not on file    Forced sexual activity: Not on file  Other Topics Concern  . Not on file  Social History Narrative   Lives with mother, brother, and step father    Allergies:  No Known Allergies  Medications:  Current Outpatient Medications:  .  calcium citrate-vitamin D (CITRACAL+D) 315-200 MG-UNIT tablet, Take 1 tablet by mouth daily., Disp: , Rfl:  .  Cyanocobalamin (VITAMIN B-12) 1000 MCG/15ML LIQD, Take 15 mLs by mouth daily., Disp: , Rfl:  .  OVER THE COUNTER MEDICATION, Take 2 tablets by mouth at bedtime., Disp: , Rfl:  .  Pediatric Multiple Vitamins (FLINTSTONES MULTIVITAMIN PO), Take 1 tablet by mouth daily., Disp: , Rfl:  .  valACYclovir (VALTREX) 500 MG tablet, Take 500 mg by mouth daily. As needed, Disp: , Rfl:    Physical Exam Vitals: BP 120/80   Pulse 86   Ht 5\' 1"  (1.549 m)   Wt 256 lb (116.1 kg)   LMP 04/24/2019 (Exact Date)   BMI 48.37  kg/m   General: BF in NAD HEENT: normocephalic, anicteric Thyroid: no enlargement, no palpable nodules Pulmonary: No increased work of breathing, CTAB Cardiovascular: RRR, without murmur Breast: Large symmetrical breasts, no tenderness, no palpable nodules or masses, no skin or nipple retraction present, no nipple discharge.  No axillary or supraclavicular lymphadenopathy. Abdomen: soft, non-tender, obese,non-distended.  Umbilicus without lesions.  No hepatomegaly or masses palpable. No evidence of hernia  Genitourinary:  External: Normal external female genitalia.  Normal urethral meatus, normal Bartholin's and Skene's glands.    Vagina: Normal vaginal mucosa, no evidence of prolapse, white cottage cheese  discharge  Cervix: Grossly normal in appearance, no bleeding, no CMT, IUD strings not visualized   Tried to tease the IUD strings down with a Pap brush, then tried to grab strings with packing forceps -but was unsuccessful in removing IUD. Prepped the cervix with Betadiene, then sprayed the cervix with Hurricaine anesthetic. Tenaculum applied to the anterior lip of the cervix, and an IUD remover was inserted into the uterus to try to grasp the strings or IUD arm, but was again unsuccessful.  Uterus: AV, NSSC, mobile, NT  Adnexa: no masses, NT  Rectal: deferred  Lymphatic: no evidence of inguinal lymphadenopathy Extremities: no edema, erythema, or tenderness Neurologic: Grossly intact Psychiatric: mood appropriate, affect full  Wet Prep: positive hyphae, and negative clue cells and Trichimonads  Assessment: 33 y.o. G2P1011 routine annual gyn exam Desires IUD removal- but unable to remove at this time.  MSK back pain Candida vaginitis History of syphilis History of bariatric surgery   Plan: Problem List Items Addressed This Visit    None    Visit Diagnoses    Cervical cancer screening    -  Primary   Relevant Orders   Cytology - PAP   Encounter for gynecological examination        Relevant Orders   Cytology - PAP   Screening for STD (sexually transmitted disease)       Relevant Orders   HIV Antibody (routine testing w rflx) (Completed)   Cytology - PAP   History of syphilis       Relevant Orders   RPR Qual (Completed)   History of bariatric surgery       Relevant Orders   CBC w/Diff/Platelet (Completed)   Vitamin B12 (Completed)   VITAMIN D 25 Hydroxy (Vit-D Deficiency, Fractures) (Completed)   Screening for iron deficiency anemia       Relevant Orders   CBC w/Diff/Platelet (Completed)   Screening for diabetes mellitus       Relevant Orders   Hemoglobin A1c (Completed)      2) STI screening  was offered and accepted  2)  ASCCP guidelines and rational discussed.  Patient opts for yearly screening interval  3) Contraception - Unable to remove IUD today. Will have patient come back for pelvic ultrasound for IUD location and try to remove IUD at that time. Discussed options for birth control after IUD removed and she is interested in a Nuvaring.  4) Routine healthcare maintenance including cholesterol, diabetes screening ordered along with RPR, HIV, CBC, and vitamin B12 and vitamin D3 levels  5) Follow up with PCP for further evaluation of thoracic back pain  Dalia Heading, [facility], [place]

## 2019-05-07 ENCOUNTER — Other Ambulatory Visit: Payer: Self-pay

## 2019-05-07 ENCOUNTER — Encounter: Payer: Self-pay | Admitting: Certified Nurse Midwife

## 2019-05-07 ENCOUNTER — Ambulatory Visit (INDEPENDENT_AMBULATORY_CARE_PROVIDER_SITE_OTHER): Payer: 59 | Admitting: Certified Nurse Midwife

## 2019-05-07 VITALS — BP 120/80 | HR 86 | Ht 61.0 in | Wt 256.0 lb

## 2019-05-07 DIAGNOSIS — T8332XA Displacement of intrauterine contraceptive device, initial encounter: Secondary | ICD-10-CM

## 2019-05-07 DIAGNOSIS — B3731 Acute candidiasis of vulva and vagina: Secondary | ICD-10-CM

## 2019-05-07 DIAGNOSIS — Z130 Encounter for screening for diseases of the blood and blood-forming organs and certain disorders involving the immune mechanism: Secondary | ICD-10-CM

## 2019-05-07 DIAGNOSIS — N898 Other specified noninflammatory disorders of vagina: Secondary | ICD-10-CM | POA: Diagnosis not present

## 2019-05-07 DIAGNOSIS — A6000 Herpesviral infection of urogenital system, unspecified: Secondary | ICD-10-CM

## 2019-05-07 DIAGNOSIS — Z131 Encounter for screening for diabetes mellitus: Secondary | ICD-10-CM

## 2019-05-07 DIAGNOSIS — B373 Candidiasis of vulva and vagina: Secondary | ICD-10-CM

## 2019-05-07 DIAGNOSIS — Z124 Encounter for screening for malignant neoplasm of cervix: Secondary | ICD-10-CM

## 2019-05-07 DIAGNOSIS — Z30432 Encounter for removal of intrauterine contraceptive device: Secondary | ICD-10-CM | POA: Diagnosis not present

## 2019-05-07 DIAGNOSIS — Z01419 Encounter for gynecological examination (general) (routine) without abnormal findings: Secondary | ICD-10-CM

## 2019-05-07 DIAGNOSIS — Z113 Encounter for screening for infections with a predominantly sexual mode of transmission: Secondary | ICD-10-CM

## 2019-05-07 DIAGNOSIS — Z8619 Personal history of other infectious and parasitic diseases: Secondary | ICD-10-CM

## 2019-05-07 DIAGNOSIS — Z9884 Bariatric surgery status: Secondary | ICD-10-CM

## 2019-05-07 MED ORDER — FLUCONAZOLE 150 MG PO TABS: ORAL_TABLET | ORAL | 0 refills | Status: DC

## 2019-05-08 LAB — CBC WITH DIFFERENTIAL/PLATELET
Basophils Absolute: 0.1 10*3/uL (ref 0.0–0.2)
Basos: 1 %
EOS (ABSOLUTE): 0.2 10*3/uL (ref 0.0–0.4)
Eos: 2 %
Hematocrit: 33.6 % — ABNORMAL LOW (ref 34.0–46.6)
Hemoglobin: 10.6 g/dL — ABNORMAL LOW (ref 11.1–15.9)
Immature Grans (Abs): 0 10*3/uL (ref 0.0–0.1)
Immature Granulocytes: 0 %
Lymphocytes Absolute: 3.5 10*3/uL — ABNORMAL HIGH (ref 0.7–3.1)
Lymphs: 31 %
MCH: 25.2 pg — ABNORMAL LOW (ref 26.6–33.0)
MCHC: 31.5 g/dL (ref 31.5–35.7)
MCV: 80 fL (ref 79–97)
Monocytes Absolute: 0.8 10*3/uL (ref 0.1–0.9)
Monocytes: 7 %
Neutrophils Absolute: 6.9 10*3/uL (ref 1.4–7.0)
Neutrophils: 59 %
Platelets: 311 10*3/uL (ref 150–450)
RBC: 4.2 x10E6/uL (ref 3.77–5.28)
RDW: 14.5 % (ref 11.7–15.4)
WBC: 11.4 10*3/uL — ABNORMAL HIGH (ref 3.4–10.8)

## 2019-05-08 LAB — VITAMIN D 25 HYDROXY (VIT D DEFICIENCY, FRACTURES): Vit D, 25-Hydroxy: 24.3 ng/mL — ABNORMAL LOW (ref 30.0–100.0)

## 2019-05-08 LAB — HIV ANTIBODY (ROUTINE TESTING W REFLEX): HIV Screen 4th Generation wRfx: NONREACTIVE

## 2019-05-08 LAB — HEMOGLOBIN A1C
Est. average glucose Bld gHb Est-mCnc: 117 mg/dL
Hgb A1c MFr Bld: 5.7 % — ABNORMAL HIGH (ref 4.8–5.6)

## 2019-05-08 LAB — RPR QUALITATIVE: RPR Ser Ql: REACTIVE — AB

## 2019-05-08 LAB — RPR, QUANT. (REFLEX): Rapid Plasma Reagin, Quant: 1:2 {titer} — ABNORMAL HIGH

## 2019-05-08 LAB — VITAMIN B12: Vitamin B-12: 553 pg/mL (ref 232–1245)

## 2019-05-09 ENCOUNTER — Encounter: Payer: Self-pay | Admitting: Certified Nurse Midwife

## 2019-05-09 DIAGNOSIS — Z8619 Personal history of other infectious and parasitic diseases: Secondary | ICD-10-CM | POA: Insufficient documentation

## 2019-05-09 DIAGNOSIS — A6000 Herpesviral infection of urogenital system, unspecified: Secondary | ICD-10-CM | POA: Insufficient documentation

## 2019-05-09 DIAGNOSIS — Z9884 Bariatric surgery status: Secondary | ICD-10-CM | POA: Insufficient documentation

## 2019-05-09 LAB — POCT WET PREP (WET MOUNT): Trichomonas Wet Prep HPF POC: ABSENT

## 2019-05-12 ENCOUNTER — Other Ambulatory Visit (HOSPITAL_COMMUNITY)
Admission: RE | Admit: 2019-05-12 | Discharge: 2019-05-12 | Disposition: A | Discharge status: Home / Self Care | Payer: 59 | Source: Ambulatory Visit | Attending: Certified Nurse Midwife | Admitting: Certified Nurse Midwife

## 2019-05-12 DIAGNOSIS — Z113 Encounter for screening for infections with a predominantly sexual mode of transmission: Secondary | ICD-10-CM | POA: Diagnosis present

## 2019-05-12 DIAGNOSIS — Z124 Encounter for screening for malignant neoplasm of cervix: Secondary | ICD-10-CM | POA: Diagnosis not present

## 2019-05-12 NOTE — Addendum Note (Signed)
Addended by: Dalia Heading on: 05/12/2019 02:39 PM   Modules accepted: Orders

## 2019-05-14 LAB — CYTOLOGY - PAP
Chlamydia: NEGATIVE
Comment: NEGATIVE
Comment: NEGATIVE
Comment: NEGATIVE
Comment: NORMAL
Diagnosis: NEGATIVE
High risk HPV: NEGATIVE
Neisseria Gonorrhea: NEGATIVE
Trichomonas: NEGATIVE

## 2019-05-17 ENCOUNTER — Telehealth: Payer: Self-pay | Admitting: Certified Nurse Midwife

## 2019-05-17 NOTE — Telephone Encounter (Signed)
Called Nancy Owen with her lab results. Pap was normal with negative HRHPV. Negative vaginal cultures for GC/Chlamydia Mild anemia post bariatric surgery. Normal vitamin B12. Taking Flintstone vitamins-2 daily. Recommend adding iron supplement at least 3 times a week. Vitamin D3 was low-Recommend vitamin D3 supplement 1000 IU daily.  RPR titer was lower at 1:2. HIV negative.  Dalia Heading, CNM

## 2019-05-19 ENCOUNTER — Other Ambulatory Visit: Payer: Self-pay

## 2019-05-19 ENCOUNTER — Encounter: Payer: Self-pay | Admitting: Certified Nurse Midwife

## 2019-05-19 ENCOUNTER — Ambulatory Visit (INDEPENDENT_AMBULATORY_CARE_PROVIDER_SITE_OTHER): Payer: 59 | Admitting: Certified Nurse Midwife

## 2019-05-19 ENCOUNTER — Ambulatory Visit (INDEPENDENT_AMBULATORY_CARE_PROVIDER_SITE_OTHER): Payer: 59

## 2019-05-19 VITALS — BP 120/80 | HR 66 | Ht 61.0 in | Wt 255.0 lb

## 2019-05-19 DIAGNOSIS — N83202 Unspecified ovarian cyst, left side: Secondary | ICD-10-CM

## 2019-05-19 DIAGNOSIS — T8332XA Displacement of intrauterine contraceptive device, initial encounter: Secondary | ICD-10-CM

## 2019-05-19 DIAGNOSIS — Z30432 Encounter for removal of intrauterine contraceptive device: Secondary | ICD-10-CM

## 2019-05-19 DIAGNOSIS — Z30015 Encounter for initial prescription of vaginal ring hormonal contraceptive: Secondary | ICD-10-CM

## 2019-05-19 MED ORDER — ETONOGESTREL-ETHINYL ESTRADIOL 0.12-0.015 MG/24HR VA RING: VAGINAL_RING | VAGINAL | 11 refills | Status: DC

## 2019-05-19 NOTE — Progress Notes (Signed)
    GYNECOLOGY OFFICE PROCEDURE NOTE  Nancy Owen is a 33 y.o. G2P1011 here for Mirena IUD removal. At her last visit she desired an IUD removal. IUD strings were not visible. Attempt was made to remove IUD, but was unsuccessful. She had an ultrasound done today which confirmed her IUD was in the endometrial cavity.  There was also a 3 cm septated cyst noted in the left ovary Last pap smear was on 05/07/2019 and was normal. She desires to try Nuvaring for contraception. She is also interested in weight loss and was asking about starting Qsymia  IUD Revoval Procedure Note Patient identified, informed consent performed, consent signed. She was premedicated with 600 mgm ibuprofen  On bimanual exam, uterus was Anteverted Speculum placed in the vagina.  Cervix visualized.  Cleaned with Betadine x 2. Cervix was sprayed with Hurricaine anesthetic and  grasped anteriorly with a single tooth tenaculum.  Tried to grasp the IUD strings with the packing forceps with no success. Then introduced the IUD remover inside the uterine cavity and after several passes, was able to move the IUD stem into the endocervix. The IUD was then successfully grasped wit the packing forceps and removed intact. Tenaculum was removed, and silver nitrate was applied to tenaculum sites for hemostasis.  Patient tolerated procedure well.   Patient was given post-procedure instructions. She is to report fever and abdominal pain over the next 2 weeks. RX for Nuvaring sent to pharmacy and explained how to use. Also discussed possible side effects and risks of thromboembolism. Follow up in 6-8 weeks   Dalia Heading, [place] 05/19/19

## 2019-05-27 ENCOUNTER — Ambulatory Visit: Payer: 59 | Admitting: Advanced Practice Midwife

## 2019-06-01 ENCOUNTER — Encounter: Payer: Self-pay | Admitting: Obstetrics and Gynecology

## 2019-06-01 ENCOUNTER — Ambulatory Visit (INDEPENDENT_AMBULATORY_CARE_PROVIDER_SITE_OTHER): Payer: 59 | Admitting: Obstetrics and Gynecology

## 2019-06-01 ENCOUNTER — Other Ambulatory Visit: Payer: Self-pay | Admitting: Obstetrics and Gynecology

## 2019-06-01 ENCOUNTER — Other Ambulatory Visit: Payer: Self-pay

## 2019-06-01 VITALS — BP 112/72 | HR 95 | Ht 61.0 in | Wt 257.0 lb

## 2019-06-01 DIAGNOSIS — Z6841 Body Mass Index (BMI) 40.0 and over, adult: Secondary | ICD-10-CM | POA: Diagnosis not present

## 2019-06-01 MED ORDER — PHENTERMINE HCL 37.5 MG PO TABS: 37.5000 mg | ORAL_TABLET | Freq: Every day | ORAL | 0 refills | Status: DC

## 2019-06-01 NOTE — Patient Instructions (Signed)
° °Calorie Counting for Weight Loss °Calories are units of energy. Your body needs a certain amount of calories from food to keep you going throughout the day. When you eat more calories than your body needs, your body stores the extra calories as fat. When you eat fewer calories than your body needs, your body burns fat to get the energy it needs. °Calorie counting means keeping track of how many calories you eat and drink each day. Calorie counting can be helpful if you need to lose weight. If you make sure to eat fewer calories than your body needs, you should lose weight. Ask your health care provider what a healthy weight is for you. °For calorie counting to work, you will need to eat the right number of calories in a day in order to lose a healthy amount of weight per week. A dietitian can help you determine how many calories you need in a day and will give you suggestions on how to reach your calorie goal. °· A healthy amount of weight to lose per week is usually 1-2 lb (0.5-0.9 kg). This usually means that your daily calorie intake should be reduced by 500-750 calories. °· Eating 1,200 - 1,500 calories per day can help most women lose weight. °· Eating 1,500 - 1,800 calories per day can help most men lose weight. °What is my plan? °My goal is to have __________ calories per day. °If I have this many calories per day, I should lose around __________ pounds per week. °What do I need to know about calorie counting? °In order to meet your daily calorie goal, you will need to: °· Find out how many calories are in each food you would like to eat. Try to do this before you eat. °· Decide how much of the food you plan to eat. °· Write down what you ate and how many calories it had. Doing this is called keeping a food log. °To successfully lose weight, it is important to balance calorie counting with a healthy lifestyle that includes regular activity. Aim for 150 minutes of moderate exercise (such as walking) or 75  minutes of vigorous exercise (such as running) each week. °Where do I find calorie information? ° °The number of calories in a food can be found on a Nutrition Facts label. If a food does not have a Nutrition Facts label, try to look up the calories online or ask your dietitian for help. °Remember that calories are listed per serving. If you choose to have more than one serving of a food, you will have to multiply the calories per serving by the amount of servings you plan to eat. For example, the label on a package of bread might say that a serving size is 1 slice and that there are 90 calories in a serving. If you eat 1 slice, you will have eaten 90 calories. If you eat 2 slices, you will have eaten 180 calories. °How do I keep a food log? °Immediately after each meal, record the following information in your food log: °· What you ate. Don't forget to include toppings, sauces, and other extras on the food. °· How much you ate. This can be measured in cups, ounces, or number of items. °· How many calories each food and drink had. °· The total number of calories in the meal. °Keep your food log near you, such as in a small notebook in your pocket, or use a mobile app or website. Some programs will   calculate calories for you and show you how many calories you have left for the day to meet your goal. °What are some calorie counting tips? ° °· Use your calories on foods and drinks that will fill you up and not leave you hungry: °? Some examples of foods that fill you up are nuts and nut butters, vegetables, lean proteins, and high-fiber foods like whole grains. High-fiber foods are foods with more than 5 g fiber per serving. °? Drinks such as sodas, specialty coffee drinks, alcohol, and juices have a lot of calories, yet do not fill you up. °· Eat nutritious foods and avoid empty calories. Empty calories are calories you get from foods or beverages that do not have many vitamins or protein, such as candy, sweets, and  soda. It is better to have a nutritious high-calorie food (such as an avocado) than a food with few nutrients (such as a bag of chips). °· Know how many calories are in the foods you eat most often. This will help you calculate calorie counts faster. °· Pay attention to calories in drinks. Low-calorie drinks include water and unsweetened drinks. °· Pay attention to nutrition labels for "low fat" or "fat free" foods. These foods sometimes have the same amount of calories or more calories than the full fat versions. They also often have added sugar, starch, or salt, to make up for flavor that was removed with the fat. °· Find a way of tracking calories that works for you. Get creative. Try different apps or programs if writing down calories does not work for you. °What are some portion control tips? °· Know how many calories are in a serving. This will help you know how many servings of a certain food you can have. °· Use a measuring cup to measure serving sizes. You could also try weighing out portions on a kitchen scale. With time, you will be able to estimate serving sizes for some foods. °· Take some time to put servings of different foods on your favorite plates, bowls, and cups so you know what a serving looks like. °· Try not to eat straight from a bag or box. Doing this can lead to overeating. Put the amount you would like to eat in a cup or on a plate to make sure you are eating the right portion. °· Use smaller plates, glasses, and bowls to prevent overeating. °· Try not to multitask (for example, watch TV or use your computer) while eating. If it is time to eat, sit down at a table and enjoy your food. This will help you to know when you are full. It will also help you to be aware of what you are eating and how much you are eating. °What are tips for following this plan? °Reading food labels °· Check the calorie count compared to the serving size. The serving size may be smaller than what you are used to  eating. °· Check the source of the calories. Make sure the food you are eating is high in vitamins and protein and low in saturated and trans fats. °Shopping °· Read nutrition labels while you shop. This will help you make healthy decisions before you decide to purchase your food. °· Make a grocery list and stick to it. °Cooking °· Try to cook your favorite foods in a healthier way. For example, try baking instead of frying. °· Use low-fat dairy products. °Meal planning °· Use more fruits and vegetables. Half of your plate should be   fruits and vegetables. °· Include lean proteins like poultry and fish. °How do I count calories when eating out? °· Ask for smaller portion sizes. °· Consider sharing an entree and sides instead of getting your own entree. °· If you get your own entree, eat only half. Ask for a box at the beginning of your meal and put the rest of your entree in it so you are not tempted to eat it. °· If calories are listed on the menu, choose the lower calorie options. °· Choose dishes that include vegetables, fruits, whole grains, low-fat dairy products, and lean protein. °· Choose items that are boiled, broiled, grilled, or steamed. Stay away from items that are buttered, battered, fried, or served with cream sauce. Items labeled "crispy" are usually fried, unless stated otherwise. °· Choose water, low-fat milk, unsweetened iced tea, or other drinks without added sugar. If you want an alcoholic beverage, choose a lower calorie option such as a glass of wine or light beer. °· Ask for dressings, sauces, and syrups on the side. These are usually high in calories, so you should limit the amount you eat. °· If you want a salad, choose a garden salad and ask for grilled meats. Avoid extra toppings like bacon, cheese, or fried items. Ask for the dressing on the side, or ask for olive oil and vinegar or lemon to use as dressing. °· Estimate how many servings of a food you are given. For example, a serving of  cooked rice is ½ cup or about the size of half a baseball. Knowing serving sizes will help you be aware of how much food you are eating at restaurants. The list below tells you how big or small some common portion sizes are based on everyday objects: °? 1 oz--4 stacked dice. °? 3 oz--1 deck of cards. °? 1 tsp--1 die. °? 1 Tbsp--½ a ping-pong ball. °? 2 Tbsp--1 ping-pong ball. °? ½ cup--½ baseball. °? 1 cup--1 baseball. °Summary °· Calorie counting means keeping track of how many calories you eat and drink each day. If you eat fewer calories than your body needs, you should lose weight. °· A healthy amount of weight to lose per week is usually 1-2 lb (0.5-0.9 kg). This usually means reducing your daily calorie intake by 500-750 calories. °· The number of calories in a food can be found on a Nutrition Facts label. If a food does not have a Nutrition Facts label, try to look up the calories online or ask your dietitian for help. °· Use your calories on foods and drinks that will fill you up, and not on foods and drinks that will leave you hungry. °· Use smaller plates, glasses, and bowls to prevent overeating. °This information is not intended to replace advice given to you by your health care provider. Make sure you discuss any questions you have with your health care provider. °Document Released: 07/01/2005 Document Revised: 03/20/2018 Document Reviewed: 05/31/2016 °Elsevier Patient Education © 2020 [employer] ° °

## 2019-06-01 NOTE — Progress Notes (Signed)
Gynecology Office Visit  Chief Complaint:  Chief Complaint  Patient presents with  . Advice Only    discuss weight loss    History of Present Illness: Patientis a 33 y.o. G2P1011 female, who presents for the evaluation of weight gain. She has gained 42 pounds primarily over 12 months. The patient states the following issues have contributed to her weight problem: decreased physical activity in 2020 The patient has no additional symptoms. The patient specifically denies memory loss, muscle weakness, excessive thirst, and polyuria. Weight related co-morbidities include sleep apnea.  She has tried bariatric surgery and other weightloss interventions in the past with good success initially.   Review of Systems: 10 point review of systems negative unless otherwise noted in HPI  Past Medical History:  Past Medical History:  Diagnosis Date  . Allergic rhinitis   . Encounter for insertion of mirena IUD   . GERD (gastroesophageal reflux disease)   . Gonorrhea   . Herpes genitalia   . High-risk pregnancy 01/17/2014   chorio/mild utering atony  . History of hiatal hernia   . History of syphilis   . Obesity   . Sleep apnea   . Syphilis     Past Surgical History:  Past Surgical History:  Procedure Laterality Date  . FRACTURE SURGERY  2004   L left tibia  . LAPAROSCOPIC GASTRIC SLEEVE RESECTION N/A 07/25/2015   Procedure: LAPAROSCOPIC GASTRIC SLEEVE RESECTION;  Surgeon: Excell Seltzer, MD;  Location: [facility];  Service: General;  Laterality: N/A;    Gynecologic History: Patient's last menstrual period was 05/22/2019.  Obstetric History: G2P1011  Family History:  Family History  Problem Relation Age of Onset  . Hypertension Maternal Grandmother   . Diabetes Maternal Grandfather   . Hypertension Maternal Grandfather     Social History:  Social History   Socioeconomic History  . Marital status: Married    Spouse name: Not on file  . Number of children: 1  . Years of  education: Not on file  . Highest education level: Not on file  Occupational History  . 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 Financial resource strain: Not on file  . Food insecurity    Worry: Not on file    Inability: Not on file  . Transportation needs    Medical: Not on file    Non-medical: Not on file  Tobacco Use  . Smoking status: Never Smoker  . Smokeless tobacco: Never Used  Substance and Sexual Activity  . Alcohol use: Yes    Alcohol/week: 0.0 standard drinks    Comment: not much now, just at holidays wine cooler  . Drug use: No  . Sexual activity: Yes    Birth control/protection: Inserts  Lifestyle  . Physical activity    Days per week: Not on file    Minutes per session: Not on file  . Stress: Not on file  Relationships  . Social Herbalist on phone: Not on file    Gets together: Not on file    Attends religious service: Not on file    Active member of club or organization: Not on file    Attends meetings of clubs or organizations: Not on file    Relationship status: Not on file  . Intimate partner violence    Fear of current or ex partner: Not on file    Emotionally abused: Not on file  Physically abused: Not on file    Forced sexual activity: Not on file  Other Topics Concern  . Not on file  Social History Narrative   Lives with mother, brother, and step father    Allergies:  No Known Allergies  Medications: Prior to Admission medications   Medication Sig Start Date End Date Taking? Authorizing Provider  calcium citrate-vitamin D (CITRACAL+D) 315-200 MG-UNIT tablet Take 1 tablet by mouth daily.   Yes [provider]  Cyanocobalamin (VITAMIN B-12) 1000 MCG/15ML LIQD Take 15 mLs by mouth daily.   Yes [provider]  etonogestrel-ethinyl estradiol (NUVARING) 0.12-0.015 MG/24HR vaginal ring Insert vaginally and leave in place for 3 consecutive weeks, then remove for 1  week. 05/19/19  Yes Dalia Heading, CNM  fluconazole (DIFLUCAN) 150 MG tablet Take one every 3 days x 2 doses 05/07/19  Yes Dalia Heading, CNM  OVER THE COUNTER MEDICATION Take 2 tablets by mouth at bedtime.   Yes [provider]  Pediatric Multiple Vitamins (FLINTSTONES MULTIVITAMIN PO) Take 1 tablet by mouth daily.   Yes [provider]  valACYclovir (VALTREX) 500 MG tablet Take 500 mg by mouth daily. As needed   Yes [provider]    Physical Exam Blood pressure 112/72, pulse 95, height 5\' 1"  (1.549 m), weight 257 lb (116.6 kg), last menstrual period 05/22/2019. Patient's last menstrual period was 05/22/2019.  Body mass index is 48.56 kg/m.   General: NAD HEENT: normocephalic, anicteric Thyroid: no enlargement Pulmonary: no increased work of breathing Neurologic: Grossly intact Psychiatric: mood appropriate, affect full  Assessment: 33 y.o. G2P1011 presenting for discussion of weight loss management options  Plan: Problem List Items Addressed This Visit      Other   Obesity - Primary   Relevant Medications   phentermine (ADIPEX-P) 37.5 MG tablet   Other Relevant Orders   TSH      1) 1500 Calorie ADA Diet  2) Patient education given regarding appropriate lifestyle changes for weight loss including: regular physical activity, healthy coping strategies, caloric restriction and healthy eating patterns.  3) Patient will be started on weight loss medication. The risks and benefits and side effects of medication, such as Adipex (Phenteramine) ,  Tenuate (Diethylproprion), Belviq (lorcarsin), Contrave (buproprion/naltrexone), Qsymia (phentermine/topiramate), and Saxenda (liraglutide) is discussed. The pros and cons of suppressing appetite and boosting metabolism is discussed. Risks of tolerence and addiction is discussed for selected agents discussed. Use of medicine will ne short term, such as 3-4 months at a time followed by a period of time off  of the medicine to avoid these risks and side effects for Adipex, Qsymia, and Tenuate discussed. Pt to call with any negative side effects and agrees to keep follow up appts.  4) Comorbidity Screening - hypothyroidism screening, diabetes, and hyperlipidemia screening offered  5) Encouraged weekly weight monitorig to track progress and sample 1 week food diary  6) Contraception - discussed that all weight loss drugs fall in to pregnancy category X, patient currently has reliable contraception in the form of  Nuvaring  7) 15 minutes face-to-face; counseling/coordination of care > 50 percent of visit  8) Return in about 4 weeks (around 06/29/2019) for medication follow up.   [name], MD, [facility], [facility] 06/01/2019, 3:29 PM

## 2019-06-02 LAB — TSH: TSH: 0.643 u[IU]/mL (ref 0.450–4.500)

## 2019-06-29 ENCOUNTER — Encounter: Payer: Self-pay | Admitting: Obstetrics and Gynecology

## 2019-06-29 ENCOUNTER — Ambulatory Visit (INDEPENDENT_AMBULATORY_CARE_PROVIDER_SITE_OTHER): Payer: 59 | Admitting: Obstetrics and Gynecology

## 2019-06-29 ENCOUNTER — Other Ambulatory Visit: Payer: Self-pay

## 2019-06-29 VITALS — BP 126/84 | HR 92 | Ht 61.0 in | Wt 246.0 lb

## 2019-06-29 DIAGNOSIS — Z6841 Body Mass Index (BMI) 40.0 and over, adult: Secondary | ICD-10-CM

## 2019-06-29 MED ORDER — PHENTERMINE HCL 37.5 MG PO TABS: 37.5000 mg | ORAL_TABLET | Freq: Every day | ORAL | 0 refills | Status: DC

## 2019-06-29 NOTE — Progress Notes (Signed)
Gynecology Office Visit  Chief Complaint:  Chief Complaint  Patient presents with  . Weight Check    History of Present Illness: Patientis a 33 y.o. G53P1011 female, who presents for the evaluation of the desire to lose weight. She has lost 11 pounds 1 months. The patient states the following symptoms since starting her weight loss therapy: appetite suppression, energy, and weight loss.  The patient also reports no other ill effects. The patient specifically denies heart palpitations, anxiety, and insomnia.    Review of Systems: 10 point review of systems negative unless otherwise noted in HPI  Past Medical History:  Past Medical History:  Diagnosis Date  . Allergic rhinitis   . Encounter for insertion of mirena IUD   . GERD (gastroesophageal reflux disease)   . Gonorrhea   . Herpes genitalia   . High-risk pregnancy 01/17/2014   chorio/mild utering atony  . History of hiatal hernia   . History of syphilis   . Obesity   . Sleep apnea   . Syphilis     Past Surgical History:  Past Surgical History:  Procedure Laterality Date  . FRACTURE SURGERY  2004   L left tibia  . LAPAROSCOPIC GASTRIC SLEEVE RESECTION N/A 07/25/2015   Procedure: LAPAROSCOPIC GASTRIC SLEEVE RESECTION;  Surgeon: Excell Seltzer, MD;  Location: [facility];  Service: General;  Laterality: N/A;    Gynecologic History: No LMP recorded. (Menstrual status: Other).  Obstetric History: G2P1011  Family History:  Family History  Problem Relation Age of Onset  . Hypertension Maternal Grandmother   . Diabetes Maternal Grandfather   . Hypertension Maternal Grandfather     Social History:  Social History   Socioeconomic History  . Marital status: Married    Spouse name: Not on file  . Number of children: 1  . Years of education: Not on file  . Highest education level: Not on file  Occupational History  . 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  Smoking status: Never Smoker  . Smokeless tobacco: Never Used  Substance and Sexual Activity  . Alcohol use: Yes    Alcohol/week: 0.0 standard drinks    Comment: not much now, just at holidays wine cooler  . Drug use: No  . Sexual activity: Yes    Birth control/protection: Inserts  Other Topics Concern  . Not on file  Social History Narrative   Lives with mother, brother, and step father   Social Determinants of Health   Financial Resource Strain:   . Difficulty of Paying Living Expenses: Not on file  Food Insecurity:   . Worried About Charity fundraiser in the Last Year: Not on file  . Ran Out of Food in the Last Year: Not on file  Transportation Needs:   . Lack of Transportation (Medical): Not on file  . Lack of Transportation (Non-Medical): Not on file  Physical Activity:   . Days of Exercise per Week: Not on file  . Minutes of Exercise per Session: Not on file  Stress:   . Feeling of Stress : Not on file  Social Connections:   . Frequency of Communication with Friends and Family: Not on file  . Frequency of Social Gatherings with Friends and Family: Not on file  . Attends Religious Services: Not on file  . Active Member of Clubs or Organizations: Not on file  . Attends Archivist Meetings: Not on  file  . Marital Status: Not on file  Intimate Partner Violence:   . Fear of Current or Ex-Partner: Not on file  . Emotionally Abused: Not on file  . Physically Abused: Not on file  . Sexually Abused: Not on file    Allergies:  No Known Allergies  Medications: Prior to Admission medications   Medication Sig Start Date End Date Taking? Authorizing Provider  calcium citrate-vitamin D (CITRACAL+D) 315-200 MG-UNIT tablet Take 1 tablet by mouth daily.   Yes [provider]  Cyanocobalamin (VITAMIN B-12) 1000 MCG/15ML LIQD Take 15 mLs by mouth daily.   Yes [provider]  etonogestrel-ethinyl estradiol (NUVARING) 0.12-0.015 MG/24HR vaginal ring  Insert vaginally and leave in place for 3 consecutive weeks, then remove for 1 week. 05/19/19  Yes Dalia Heading, CNM  fluconazole (DIFLUCAN) 150 MG tablet Take one every 3 days x 2 doses 05/07/19  Yes Dalia Heading, CNM  OVER THE COUNTER MEDICATION Take 2 tablets by mouth at bedtime.   Yes [provider]  Pediatric Multiple Vitamins (FLINTSTONES MULTIVITAMIN PO) Take 1 tablet by mouth daily.   Yes [provider]  phentermine (ADIPEX-P) 37.5 MG tablet Take 1 tablet (37.5 mg total) by mouth daily before breakfast. 06/01/19  Yes [name], MD  valACYclovir (VALTREX) 500 MG tablet Take 500 mg by mouth daily. As needed   Yes [provider]    Physical Exam Blood pressure 126/84, pulse 92, height 5\' 1"  (1.549 m), weight 246 lb (111.6 kg). Wt Readings from Last 3 Encounters:  06/29/19 246 lb (111.6 kg)  06/01/19 257 lb (116.6 kg)  05/19/19 255 lb (115.7 kg)  Body mass index is 46.48 kg/m.  General: NAD HEENT: normocephalic, anicteric Thyroid: no enlargement Pulmonary: no increased work of breathing Neurologic: Grossly intact Psychiatric: mood appropriate, affect full  Assessment: 33 y.o. G2P1011 medical weight loss follow up Plan: Problem List Items Addressed This Visit      Other   Obesity - Primary   Relevant Medications   phentermine (ADIPEX-P) 37.5 MG tablet      1) 1500 Calorie ADA Diet  2) Patient education given regarding appropriate lifestyle changes for weight loss including: regular physical activity, healthy coping strategies, caloric restriction and healthy eating patterns.  3) Patient will be started on weight loss medication. The risks and benefits and side effects of medication, such as Adipex (Phenteramine) ,  Tenuate (Diethylproprion), Belviq (lorcarsin), Contrave (buproprion/naltrexone), Qsymia (phentermine/topiramate), and Saxenda (liraglutide) is discussed. The pros and cons of suppressing appetite and boosting  metabolism is discussed. Risks of tolerence and addiction is discussed for selected agents discussed. Use of medicine will ne short term, such as 3-4 months at a time followed by a period of time off of the medicine to avoid these risks and side effects for Adipex, Qsymia, and Tenuate discussed. Pt to call with any negative side effects and agrees to keep follow up appts.  4) Patient to take medication, with the benefits of appetite suppression and metabolism boost d/w pt, along with the side effects and risk factors of long term use that will be avoided with our use of short bursts of therapy. Rx provided.    5) 15 minutes face-to-face; with counseling/coordination of care > 50 percent of visit related to obesity and ongoing management/treatment   6)  Return in about 4 weeks (around 07/27/2019) for medication follow up.    [name], MD, [facility], [facility] 06/29/2019, 4:09 PM

## 2019-07-02 ENCOUNTER — Other Ambulatory Visit: Payer: 59

## 2019-07-02 ENCOUNTER — Ambulatory Visit: Payer: 59 | Admitting: Certified Nurse Midwife

## 2019-07-27 ENCOUNTER — Encounter: Payer: Self-pay | Admitting: Obstetrics and Gynecology

## 2019-07-27 ENCOUNTER — Ambulatory Visit (INDEPENDENT_AMBULATORY_CARE_PROVIDER_SITE_OTHER): Payer: No Typology Code available for payment source | Admitting: Obstetrics and Gynecology

## 2019-07-27 ENCOUNTER — Other Ambulatory Visit: Payer: Self-pay

## 2019-07-27 VITALS — BP 126/76 | Ht 61.0 in | Wt 243.0 lb

## 2019-07-27 DIAGNOSIS — Z6841 Body Mass Index (BMI) 40.0 and over, adult: Secondary | ICD-10-CM | POA: Diagnosis not present

## 2019-07-27 MED ORDER — PHENTERMINE HCL 37.5 MG PO TABS: 37.5000 mg | ORAL_TABLET | Freq: Every day | ORAL | 0 refills | Status: DC

## 2019-07-27 NOTE — Progress Notes (Signed)
Gynecology Office Visit  Chief Complaint:  Chief Complaint  Patient presents with  . Weight Check    History of Present Illness: Patientis a 34 y.o. G72P1011 female, who presents for the evaluation of the desire to lose weight. She has lost 3 pounds 1 months. The patient states the following symptoms since starting her weight loss therapy: appetite suppression, energy, and weight loss. Appetite suppression has significantly lessened. The patient also reports no other ill effects. The patient specifically denies heart palpitations, anxiety, and insomnia.    Review of Systems: 10 point review of systems negative unless otherwise noted in HPI  Past Medical History:  Past Medical History:  Diagnosis Date  . Allergic rhinitis   . Encounter for insertion of mirena IUD   . GERD (gastroesophageal reflux disease)   . Gonorrhea   . Herpes genitalia   . High-risk pregnancy 01/17/2014   chorio/mild utering atony  . History of hiatal hernia   . History of syphilis   . Obesity   . Sleep apnea   . Syphilis     Past Surgical History:  Past Surgical History:  Procedure Laterality Date  . FRACTURE SURGERY  2004   L left tibia  . LAPAROSCOPIC GASTRIC SLEEVE RESECTION N/A 07/25/2015   Procedure: LAPAROSCOPIC GASTRIC SLEEVE RESECTION;  Surgeon: Excell Seltzer, MD;  Location: [facility];  Service: General;  Laterality: N/A;    Gynecologic History: Patient's last menstrual period was 06/23/2019 (exact date).  Obstetric History: G2P1011  Family History:  Family History  Problem Relation Age of Onset  . Hypertension Maternal Grandmother   . Diabetes Maternal Grandfather   . Hypertension Maternal Grandfather     Social History:  Social History   Socioeconomic History  . Marital status: Married    Spouse name: Not on file  . Number of children: 1  . Years of education: Not on file  . Highest education level: Not on file  Occupational History  . 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 Smoking status: Never Smoker  . Smokeless tobacco: Never Used  Substance and Sexual Activity  . Alcohol use: Yes    Alcohol/week: 0.0 standard drinks    Comment: not much now, just at holidays wine cooler  . Drug use: No  . Sexual activity: Yes    Birth control/protection: Inserts  Other Topics Concern  . Not on file  Social History Narrative   Lives with mother, brother, and step father   Social Determinants of Health   Financial Resource Strain:   . Difficulty of Paying Living Expenses: Not on file  Food Insecurity:   . Worried About Charity fundraiser in the Last Year: Not on file  . Ran Out of Food in the Last Year: Not on file  Transportation Needs:   . Lack of Transportation (Medical): Not on file  . Lack of Transportation (Non-Medical): Not on file  Physical Activity:   . Days of Exercise per Week: Not on file  . Minutes of Exercise per Session: Not on file  Stress:   . Feeling of Stress : Not on file  Social Connections:   . Frequency of Communication with Friends and Family: Not on file  . Frequency of Social Gatherings with Friends and Family: Not on file  . Attends Religious Services: Not on file  . Active Member of Clubs or Organizations: Not on file  . Attends  Club or Organization Meetings: Not on file  . Marital Status: Not on file  Intimate Partner Violence:   . Fear of Current or Ex-Partner: Not on file  . Emotionally Abused: Not on file  . Physically Abused: Not on file  . Sexually Abused: Not on file    Allergies:  No Known Allergies  Medications: Prior to Admission medications   Medication Sig Start Date End Date Taking? Authorizing Provider  calcium citrate-vitamin D (CITRACAL+D) 315-200 MG-UNIT tablet Take 1 tablet by mouth daily.   Yes [provider]  Cyanocobalamin (VITAMIN B-12) 1000 MCG/15ML LIQD Take 15 mLs by mouth daily.   Yes [provider]    etonogestrel-ethinyl estradiol (NUVARING) 0.12-0.015 MG/24HR vaginal ring Insert vaginally and leave in place for 3 consecutive weeks, then remove for 1 week. 05/19/19  Yes Dalia Heading, CNM  fluconazole (DIFLUCAN) 150 MG tablet Take one every 3 days x 2 doses 05/07/19  Yes Dalia Heading, CNM  OVER THE COUNTER MEDICATION Take 2 tablets by mouth at bedtime.   Yes [provider]  Pediatric Multiple Vitamins (FLINTSTONES MULTIVITAMIN PO) Take 1 tablet by mouth daily.   Yes [provider]  phentermine (ADIPEX-P) 37.5 MG tablet Take 1 tablet (37.5 mg total) by mouth daily before breakfast. 06/29/19  Yes [name], MD  valACYclovir (VALTREX) 500 MG tablet Take 500 mg by mouth daily. As needed   Yes [provider]    Physical Exam Blood pressure 126/76, height 5\' 1"  (1.549 m), weight 243 lb (110.2 kg), last menstrual period 06/23/2019. Wt Readings from Last 3 Encounters:  07/27/19 243 lb (110.2 kg)  06/29/19 246 lb (111.6 kg)  06/01/19 257 lb (116.6 kg)  Body mass index is 45.91 kg/m.   General: NAD HEENT: normocephalic, anicteric Thyroid: no enlargement Pulmonary: no increased work of breathing Neurologic: Grossly intact Psychiatric: mood appropriate, affect full  Assessment: 34 y.o. G2P1011 medical weight loss follow up  Plan: Problem List Items Addressed This Visit      Other   Obesity - Primary   Relevant Medications   phentermine (ADIPEX-P) 37.5 MG tablet      1) 1500 Calorie ADA Diet  2) Patient education given regarding appropriate lifestyle changes for weight loss including: regular physical activity, healthy coping strategies, caloric restriction and healthy eating patterns.  3) Patient will be started on weight loss medication. The risks and benefits and side effects of medication, such as Adipex (Phenteramine) ,  Tenuate (Diethylproprion), Belviq (lorcarsin), Contrave (buproprion/naltrexone), Qsymia  (phentermine/topiramate), and Saxenda (liraglutide) is discussed. The pros and cons of suppressing appetite and boosting metabolism is discussed. Risks of tolerence and addiction is discussed for selected agents discussed. Use of medicine will ne short term, such as 3-4 months at a time followed by a period of time off of the medicine to avoid these risks and side effects for Adipex, Qsymia, and Tenuate discussed. Pt to call with any negative side effects and agrees to keep follow up appts.  4) Patient to take medication, with the benefits of appetite suppression and metabolism boost d/w pt, along with the side effects and risk factors of long term use that will be avoided with our use of short bursts of therapy. Rx provided.    5) Will plan on one additional month phentermine then plan on saxenda  6)  A total of 15 minutes were spent in face-to-face contact with the patient during this encounter with over half of that time devoted to counseling and coordination of  care.  7) Return in about 4 weeks (around 08/24/2019) for medication follow up.    [name], MD, [facility], [facility] 07/27/2019, 4:34 PM

## 2019-08-01 ENCOUNTER — Other Ambulatory Visit: Payer: Self-pay | Admitting: Certified Nurse Midwife

## 2019-08-01 DIAGNOSIS — N83299 Other ovarian cyst, unspecified side: Secondary | ICD-10-CM

## 2019-08-03 ENCOUNTER — Ambulatory Visit (INDEPENDENT_AMBULATORY_CARE_PROVIDER_SITE_OTHER): Payer: No Typology Code available for payment source

## 2019-08-03 ENCOUNTER — Other Ambulatory Visit: Payer: Self-pay

## 2019-08-03 ENCOUNTER — Ambulatory Visit (INDEPENDENT_AMBULATORY_CARE_PROVIDER_SITE_OTHER): Payer: No Typology Code available for payment source | Admitting: Certified Nurse Midwife

## 2019-08-03 ENCOUNTER — Encounter: Payer: Self-pay | Admitting: Certified Nurse Midwife

## 2019-08-03 VITALS — BP 130/90 | HR 96 | Ht 61.0 in | Wt 242.0 lb

## 2019-08-03 DIAGNOSIS — D252 Subserosal leiomyoma of uterus: Secondary | ICD-10-CM | POA: Diagnosis not present

## 2019-08-03 DIAGNOSIS — N83299 Other ovarian cyst, unspecified side: Secondary | ICD-10-CM | POA: Diagnosis not present

## 2019-08-03 DIAGNOSIS — R030 Elevated blood-pressure reading, without diagnosis of hypertension: Secondary | ICD-10-CM | POA: Diagnosis not present

## 2019-08-03 DIAGNOSIS — Z3044 Encounter for surveillance of vaginal ring hormonal contraceptive device: Secondary | ICD-10-CM

## 2019-08-03 NOTE — Progress Notes (Signed)
  [id number] Nancy Owen is a 34 y.o. G2P1011 here for a pelvic ultrasound that was ordered to follow up on a  3 cm septated cyst noted in the left ovary on a pelvic ultrasound in November. She is also following up on the [place]. She just removed her first Nuvaring 9 days ago and has not called to pick up a refill. Was not sure she could insert a new ring when she was still menstruating. The new Nuvaring should have been inserted 2 days ago. Her first menses after the Nuvaring was heavier then what she was experiencing on the Mirena IUD.  She has also started on a weight loss program with Dr Georgianne Fick and is taking phentermine  Ultrasound demonstrates resolution of the left ovarian cyst. There is a small incidental subcentimeter subserosal fibroid seen These findings are normal  PMHx: She  has a past medical history of Allergic rhinitis, Encounter for insertion of mirena IUD, GERD (gastroesophageal reflux disease), Gonorrhea, Herpes genitalia, High-risk pregnancy (01/17/2014), History of hiatal hernia, History of syphilis, Obesity, Sleep apnea, and Syphilis. Also,  has a past surgical history that includes Fracture surgery (2004) and Laparoscopic gastric sleeve resection (N/A, 07/25/2015)., family history includes Diabetes in her maternal grandfather; Hypertension in her maternal grandfather and maternal grandmother.,  reports that she has never smoked. She has never used smokeless tobacco. She reports current alcohol use. She reports that she does not use drugs.  She has a current medication list which includes the following prescription(s): calcium citrate-vitamin d, vitamin b-12, etonogestrel-ethinyl estradiol, fluconazole, OVER THE COUNTER MEDICATION, pediatric multiple vitamins, phentermine, and valacyclovir. Also, has No Known Allergies.  ROS  Objective: BP 130/90   Pulse 96   Ht 5\' 1"  (1.549 m)   Wt 242 lb (109.8 kg)   LMP 07/29/2019 (Exact Date)   BMI 45.73 kg/m   Physical  examination Constitutional NAD, Conversant     Extremities: Moves all appropriately.  Normal ROM for age. No lymphadenopathy.  Neuro: Grossly intact  Psych: Oriented to PPT.  Normal mood. Normal affect.   Assessment/ Plan:  Mild blood pressure elevation from ?anxiety, side effect from phentermine, ?side effect from estrogen containing Nuvaring  Blood pressure check next month when following up with Dr Georgianne Fick Resolution of septated left ovarian cyst Reviewed use of Nuvaring and instructed to insert new Nuvaring a week after removing old Nuvaring whether bleeding or not. Also discussed using Nuvaring continuously x 2 months if desires.Recommend using back up birth control until back on Nuvaring x 2 weeks Can transfer her RX for Nuvaring to a new pharmacy if desires, Advised that she has a year of refills on prescription  Dalia Heading, CNM

## 2019-08-30 ENCOUNTER — Encounter: Payer: Self-pay | Admitting: Obstetrics and Gynecology

## 2019-08-30 ENCOUNTER — Ambulatory Visit (INDEPENDENT_AMBULATORY_CARE_PROVIDER_SITE_OTHER): Payer: 59 | Admitting: Obstetrics and Gynecology

## 2019-08-30 ENCOUNTER — Other Ambulatory Visit: Payer: Self-pay

## 2019-08-30 VITALS — BP 124/70 | HR 109 | Ht 61.0 in | Wt 242.0 lb

## 2019-08-30 DIAGNOSIS — Z713 Dietary counseling and surveillance: Secondary | ICD-10-CM

## 2019-08-30 DIAGNOSIS — Z6841 Body Mass Index (BMI) 40.0 and over, adult: Secondary | ICD-10-CM | POA: Diagnosis not present

## 2019-08-30 NOTE — Progress Notes (Signed)
I connected with Nancy Owen  on 08/30/19 at  4:10 PM EST by telephone and verified that I am speaking with the correct person using two identifiers.   I discussed the limitations, risks, security and privacy concerns of performing an evaluation and management service by telephone and the availability of in person appointments. I also discussed with the patient that there may be a patient responsible charge related to this service. The patient expressed understanding and agreed to proceed.  The patient was at home I spoke with the patient from my workstation phone The names of people involved in this encounter were: Nancy Owen , and [name]   Gynecology Office Visit  Chief Complaint:  Chief Complaint  Patient presents with  . Weight Check    History of Present Illness: Patientis a 34 y.o. G40P1011 female, who presents for the evaluation of the desire to lose weight. She has lost 1 pounds 1 months. The patient states the following symptoms since starting her weight loss therapy: appetite suppression, energy, and weight loss.  The patient also reports no other ill effects. The patient specifically denies heart palpitations, anxiety, and insomnia.    Review of Systems: 10 point review of systems negative unless otherwise noted in HPI  Past Medical History:  Past Medical History:  Diagnosis Date  . Allergic rhinitis   . Encounter for insertion of mirena IUD   . GERD (gastroesophageal reflux disease)   . Gonorrhea   . Herpes genitalia   . High-risk pregnancy 01/17/2014   chorio/mild utering atony  . History of hiatal hernia   . History of syphilis   . Obesity   . Sleep apnea   . Syphilis     Past Surgical History:  Past Surgical History:  Procedure Laterality Date  . FRACTURE SURGERY  2004   L left tibia  . LAPAROSCOPIC GASTRIC SLEEVE RESECTION N/A 07/25/2015   Procedure: LAPAROSCOPIC GASTRIC SLEEVE RESECTION;  Surgeon: Excell Seltzer, MD;  Location:  [facility];  Service: General;  Laterality: N/A;    Gynecologic History: Patient's last menstrual period was 08/22/2019.  Obstetric History: G2P1011  Family History:  Family History  Problem Relation Age of Onset  . Hypertension Maternal Grandmother   . Diabetes Maternal Grandfather   . Hypertension Maternal Grandfather     Social History:  Social History   Socioeconomic History  . Marital status: Married    Spouse name: Not on file  . Number of children: 1  . Years of education: Not on file  . Highest education level: Not on file  Occupational History  . 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 Smoking status: Never Smoker  . Smokeless tobacco: Never Used  Substance and Sexual Activity  . Alcohol use: Yes    Alcohol/week: 0.0 standard drinks    Comment: not much now, just at holidays wine cooler  . Drug use: No  . Sexual activity: Yes    Birth control/protection: Inserts  Other Topics Concern  . Not on file  Social History Narrative   Lives with mother, brother, and step father   Social Determinants of Health   Financial Resource Strain:   . Difficulty of Paying Living Expenses: Not on file  Food Insecurity:   . Worried About Charity fundraiser in the Last Year: Not on file  . Ran Out of Food in the Last Year: Not on file  Transportation Needs:   .  Lack of Transportation (Medical): Not on file  . Lack of Transportation (Non-Medical): Not on file  Physical Activity:   . Days of Exercise per Week: Not on file  . Minutes of Exercise per Session: Not on file  Stress:   . Feeling of Stress : Not on file  Social Connections:   . Frequency of Communication with Friends and Family: Not on file  . Frequency of Social Gatherings with Friends and Family: Not on file  . Attends Religious Services: Not on file  . Active Member of Clubs or Organizations: Not on file  . Attends Archivist Meetings: Not on file    . Marital Status: Not on file  Intimate Partner Violence:   . Fear of Current or Ex-Partner: Not on file  . Emotionally Abused: Not on file  . Physically Abused: Not on file  . Sexually Abused: Not on file    Allergies:  No Known Allergies  Medications: Prior to Admission medications   Medication Sig Start Date End Date Taking? Authorizing Provider  calcium citrate-vitamin D (CITRACAL+D) 315-200 MG-UNIT tablet Take 1 tablet by mouth daily.   Yes [provider]  Cyanocobalamin (VITAMIN B-12) 1000 MCG/15ML LIQD Take 15 mLs by mouth daily.   Yes [provider]  etonogestrel-ethinyl estradiol (NUVARING) 0.12-0.015 MG/24HR vaginal ring Insert vaginally and leave in place for 3 consecutive weeks, then remove for 1 week. 05/19/19  Yes Dalia Heading, CNM  OVER THE COUNTER MEDICATION Take 2 tablets by mouth at bedtime.   Yes [provider]  Pediatric Multiple Vitamins (FLINTSTONES MULTIVITAMIN PO) Take 1 tablet by mouth daily.   Yes [provider]  phentermine (ADIPEX-P) 37.5 MG tablet Take 1 tablet (37.5 mg total) by mouth daily before breakfast. 07/27/19  Yes [name], MD  valACYclovir (VALTREX) 500 MG tablet Take 500 mg by mouth daily. As needed   Yes [provider]    Physical Exam Blood pressure 124/70, pulse (!) 109, height 5\' 1"  (1.549 m), weight 242 lb (109.8 kg), last menstrual period 08/22/2019. Wt Readings from Last 3 Encounters:  08/30/19 242 lb (109.8 kg)  08/03/19 242 lb (109.8 kg)  07/27/19 243 lb (110.2 kg)  Body mass index is 45.73 kg/m.  No physical exam as this was a remote telephone visit to promote social distancing during the current COVID-19 Pandemic  Assessment: 34 y.o. [id number] Plan: Problem List Items Addressed This Visit    None      1) 1500 Calorie ADA Diet  2) Patient education given regarding appropriate lifestyle changes for weight loss including: regular physical activity, healthy coping  strategies, caloric restriction and healthy eating patterns.  3) Discontinue phentermine, 3 month off period.  Safety net if increase in weight by more than 5 months consider early phentermine restart.  Discussed long term options such as contrace and saxenda.   5) Telephone time 7:32 minutes  6)  No follow-ups on file.    [name], MD, [facility], [facility] 08/30/2019, 5:13 PM

## 2019-09-09 ENCOUNTER — Other Ambulatory Visit: Payer: Self-pay

## 2019-09-09 ENCOUNTER — Encounter: Payer: Self-pay | Admitting: Family Medicine

## 2019-09-09 ENCOUNTER — Ambulatory Visit (INDEPENDENT_AMBULATORY_CARE_PROVIDER_SITE_OTHER): Payer: 59 | Admitting: Family Medicine

## 2019-09-09 VITALS — BP 110/76 | HR 101 | Temp 98.0°F | Ht 61.0 in | Wt 245.0 lb

## 2019-09-09 DIAGNOSIS — M79605 Pain in left leg: Secondary | ICD-10-CM

## 2019-09-09 DIAGNOSIS — M549 Dorsalgia, unspecified: Secondary | ICD-10-CM | POA: Diagnosis not present

## 2019-09-09 DIAGNOSIS — S46819A Strain of other muscles, fascia and tendons at shoulder and upper arm level, unspecified arm, initial encounter: Secondary | ICD-10-CM

## 2019-09-09 DIAGNOSIS — N62 Hypertrophy of breast: Secondary | ICD-10-CM | POA: Diagnosis not present

## 2019-09-09 DIAGNOSIS — Z8781 Personal history of (healed) traumatic fracture: Secondary | ICD-10-CM

## 2019-09-09 NOTE — Assessment & Plan Note (Signed)
Per report, fracture at age 34 with metal rod. Still getting pain and wondering if removal of rod will help with pain. Referral to ortho to discuss.

## 2019-09-09 NOTE — Assessment & Plan Note (Signed)
Pt with large breasts and chronic upper back pain. Has tried different bras w/o success. Is interested in breast reduction surgery to help with chronic back pain.

## 2019-09-09 NOTE — Assessment & Plan Note (Signed)
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 Ref to PT to evaluate and treat with strengthening. Reports one round of PT in 2016 and does not recall significant improvement. Willing to try again. Also working on diet and exercise.

## 2019-09-09 NOTE — Patient Instructions (Signed)
#  Should/Back pain - physical therapy referral - plastic surgery referral  #History of fracture - referral to orthopedic - you may be able to call and schedule a visit without hearing from our staff  Continue to work on healthy diet and exercise for weight loss

## 2019-09-09 NOTE — Progress Notes (Signed)
Subjective:     Nancy Owen is a 34 y.o. female presenting for Establish Care, Back Pain (Has shoulder and back pain due to breasts), Weight Issues, and Leg Pain (At 16, was in an accident and had a rod placed in her left leg. More painful. Asking if possible to remove.)     HPI   #Shoulder/Back pain - thinks it may be related to breasts - got worse last march - could not move with upper back so tight and restraining - every time she gets measured she gets told a different size - will try more supportive bras w/o improvement - cannot be active due to back pain - difficult to do things like running -- breast pain from breast movement - has not done PT or any shoulder/back strengthening routine - did PT for back pain in 2016 - not sure if this helped - treatment: pain meds when symptoms severe, Ibuprofen every other day - 1 dose - has tried posture corrector when at work - works at a [employer] in leg - having significant leg pain recently - rod placed and wondering if she can have this removed  #hx of gastric sleeve - was successful at first - taking medication for weight loss - feels like she is not losing any additional weight - did gain back some from initial weight loss  Review of Systems   Social History   Tobacco Use  Smoking Status Never Smoker  Smokeless Tobacco Never Used        Objective:    BP Readings from Last 3 Encounters:  09/09/19 110/76  08/30/19 124/70  08/03/19 130/90   Wt Readings from Last 3 Encounters:  09/09/19 245 lb (111.1 kg)  08/30/19 242 lb (109.8 kg)  08/03/19 242 lb (109.8 kg)    BP 110/76 (BP Location: Left Arm, Patient Position: Sitting, Cuff Size: Large)   Pulse (!) 101   Temp 98 F (36.7 C)   Ht 5\' 1"  (1.549 m)   Wt 245 lb (111.1 kg)   LMP 08/22/2019 Comment: Nuvaring  SpO2 98%   BMI 46.29 kg/m    Physical Exam Constitutional:      General: She is not in acute distress.    Appearance: She  is well-developed. She is not diaphoretic.  HENT:     Right Ear: External ear normal.     Left Ear: External ear normal.     Nose: Nose normal.  Eyes:     Conjunctiva/sclera: Conjunctivae normal.  Cardiovascular:     Rate and Rhythm: Normal rate and regular rhythm.     Heart sounds: No murmur.  Pulmonary:     Effort: Pulmonary effort is normal. No respiratory distress.     Breath sounds: Normal breath sounds. No wheezing.  Musculoskeletal:     Cervical back: Normal range of motion and neck supple.     Comments: Normal Shoulder ROM Pain along the trapezius and between the scapulas  Left sided thoracic hypertrophy  Skin:    General: Skin is warm and dry.     Capillary Refill: Capillary refill takes less than 2 seconds.  Neurological:     Mental Status: She is alert. Mental status is at baseline.  Psychiatric:        Mood and Affect: Mood normal.        Behavior: Behavior normal.           Assessment & Plan:   Problem List Items Addressed This Visit  Other   Large breasts - Primary    Pt with large breasts and chronic upper back pain. Has tried different bras w/o success. Is interested in breast reduction surgery to help with chronic back pain.       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 Ref to PT to evaluate and treat with strengthening. Reports one round of PT in 2016 and does not recall significant improvement. Willing to try again. Also working on diet and exercise.       Hx of fracture of tibia    Per report, fracture at age 83 with metal rod. Still getting pain and wondering if removal of rod will help with pain. Referral to ortho to discuss.       Relevant Orders   Ambulatory referral to Orthopedic Surgery    Other Visit Diagnoses    Strain of trapezius muscle, unspecified laterality, initial encounter       Relevant Orders   Ambulatory referral to Orthopedic Surgery    Ambulatory referral to Physical Therapy   Left leg pain       Relevant Orders   Ambulatory referral to Orthopedic Surgery       Return if symptoms worsen or fail to improve.  [name], MD

## 2019-10-06 ENCOUNTER — Encounter: Payer: Self-pay | Admitting: Plastic Surgery

## 2019-10-06 ENCOUNTER — Ambulatory Visit (INDEPENDENT_AMBULATORY_CARE_PROVIDER_SITE_OTHER): Payer: 59 | Admitting: Plastic Surgery

## 2019-10-06 ENCOUNTER — Other Ambulatory Visit: Payer: Self-pay

## 2019-10-06 VITALS — BP 120/78 | HR 110 | Temp 97.3°F | Ht 61.0 in | Wt 247.0 lb

## 2019-10-06 DIAGNOSIS — N62 Hypertrophy of breast: Secondary | ICD-10-CM

## 2019-10-06 NOTE — Progress Notes (Signed)
Referring Provider [name], MD [place],  [place] [id number]   CC:  Chief Complaint  Patient presents with  . Advice Only    for (B) breast reduction      Nancy Owen is an 34 y.o. female.  HPI: Patient is here discuss breast reduction.  She is a 24 2H and wants to be smaller.  There is no family history of breast cancer.  She has intermittent rashes and skin irritations in between her breasts and in the inframammary crease that is not being controlled with ointments.  She has significant back pain in the shoulder and upper back area and she is a non-smoker.  No Known Allergies  Outpatient Encounter Medications as of 10/06/2019  Medication Sig Note  . calcium citrate-vitamin D (CITRACAL+D) 315-200 MG-UNIT tablet Take 1 tablet by mouth daily.   . Cyanocobalamin (VITAMIN B-12) 1000 MCG/15ML LIQD Take 15 mLs by mouth daily.   Marland Kitchen etonogestrel-ethinyl estradiol (NUVARING) 0.12-0.015 MG/24HR vaginal ring Insert vaginally and leave in place for 3 consecutive weeks, then remove for 1 week.   Marland Kitchen OVER THE COUNTER MEDICATION Take 2 tablets by mouth at bedtime. 05/09/2019: Tranquil Sleep  . Pediatric Multiple Vitamins (FLINTSTONES MULTIVITAMIN PO) Take 1 tablet by mouth daily.   . valACYclovir (VALTREX) 500 MG tablet Take 500 mg by mouth daily. As needed    No facility-administered encounter medications on file as of 10/06/2019.     Past Medical History:  Diagnosis Date  . Allergic rhinitis   . Encounter for insertion of mirena IUD   . GERD (gastroesophageal reflux disease)   . Gonorrhea   . Herpes genitalia   . High-risk pregnancy 01/17/2014   chorio/mild utering atony  . History of hiatal hernia   . History of syphilis   . Obesity   . Sleep apnea   . Syphilis     Past Surgical History:  Procedure Laterality Date  . FRACTURE SURGERY  2004   L left tibia  . LAPAROSCOPIC GASTRIC SLEEVE RESECTION N/A 07/25/2015   Procedure: LAPAROSCOPIC GASTRIC SLEEVE  RESECTION;  Surgeon: Excell Seltzer, MD;  Location: [facility];  Service: General;  Laterality: N/A;    Family History  Problem Relation Age of Onset  . Hypertension Father   . Hypertension Maternal Grandmother   . Diabetes Maternal Grandfather   . Hypertension Maternal Grandfather   . Other Cousin        breast augmentation 2/2 to pain    Social History   Social History Narrative   09/09/19   From: [place] - her mom moved her after Shanna finished college   Living: with husband [name] and daughter [name]   Work: Medical illustrator - Insurance claims handler      Family: mom nearby      Enjoys: reading, spend time with daughter - swimming, dancing      Exercise: not currently, planning to start exercise plan with sister   Diet: intermittent fasting       Safety   Seat belts: Yes    Guns: No   Safe in relationships: Yes      Review of Systems General: Denies fevers, chills, weight loss CV: Denies chest pain, shortness of breath, palpitations  Physical Exam Vitals with BMI 10/06/2019 09/09/2019 08/30/2019  Height 5\' 1"  5\' 1"  5\' 1"   Weight 247 lbs 245 lbs 242 lbs  BMI 46.69 [id number]  Systolic 123456 [id number] [id number]  Diastolic 78 76 70  Pulse A999333 101  109    General:  No acute distress,  Alert and oriented, Non-Toxic, Normal speech and affect Breast: She has grade 3 ptosis.  Sternal notch to nipple is 33 on the right and 34 on the left.  Nipple to fold is 16 bilaterally.  She has evidence of skin irritation and breakdown in between her breasts.  I do not see any obvious scars or masses.  Assessment/Plan The patient has bilateral symptomatic macromastia.  She is a good candidate for a breast reduction.  The details of breast reduction surgery were discussed.  I explained the procedure in detail along the with the expected scars.  The risks were discussed in detail and include bleeding, infection, damage to surrounding structures, need for additional procedures, nipple loss, change in  nipple sensation, persistent pain, contour irregularities and asymmetries.  I explained that breast feeding is often not possible after breast reduction surgery.  We discussed the expected postoperative course with an overall recovery period of about 1 month.  She demonstrated full understanding of all risks.  We discussed her personal risk factors that include her BMI which puts her in a higher risk category.  I anticipate approximately 850 g of tissue removed from each side.   Cindra Presume 10/06/2019, 4:16 PM

## 2019-10-22 ENCOUNTER — Ambulatory Visit: Payer: 59 | Attending: Internal Medicine

## 2019-10-22 DIAGNOSIS — Z23 Encounter for immunization: Secondary | ICD-10-CM

## 2019-10-22 NOTE — Progress Notes (Signed)
   Covid-19 Vaccination Clinic  Name:  Nancy Owen    MRN: [id number] DOB: Oct 22, 1985  10/22/2019  [name] was observed post Covid-19 immunization for 15 minutes without incident. She was provided with Vaccine Information Sheet and instruction to access the V-Safe system.   [name] was instructed to call 911 with any severe reactions post vaccine: Marland Kitchen Difficulty breathing  . Swelling of face and throat  . A fast heartbeat  . A bad rash all over body  . Dizziness and weakness   Immunizations Administered    Name Date Dose VIS Date Route   Pfizer COVID-19 Vaccine 10/22/2019  9:34 AM 0.3 mL 06/25/2019 Intramuscular   Manufacturer: [place]   Lot: [id number]   [place]: [id number]

## 2019-10-26 ENCOUNTER — Other Ambulatory Visit: Payer: Self-pay

## 2019-10-26 ENCOUNTER — Encounter (HOSPITAL_BASED_OUTPATIENT_CLINIC_OR_DEPARTMENT_OTHER): Payer: Self-pay | Admitting: Plastic Surgery

## 2019-10-27 NOTE — Progress Notes (Addendum)
ICD-10-CM   1. Macromastia  N62       Patient ID: Nancy Owen, female    DOB: 02/01/1986, 34 y.o.   MRN: [id number]   History of Present Illness: Nancy Owen is a 34 y.o.  female  with a history of macromastia.  She presents for preoperative evaluation for upcoming procedure, bilateral breast reduction, scheduled for 11/02/19 with Dr. Claudia Desanctis.  Summary from previous visit: Patient's current breast size is 42 H.  She has grade 3 ptosis.  Sternal notch to nipple distance is 33 cm on the right and 34 cm on the left.  Nipple to IMF fold is 16 bilaterally.  Evidence of skin irritation and breakdown between her breasts.  Estimated tissue to remove 850 g from each side.  Patient reports she is currently a 42H and wound like to be a C cup.  She would be ok being on the smaller side of C.   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  PMH Significant for: Obesity, Sleep Apnea (reports procedure at time of her gastric sleeve resolved the sleep apnea).  The patient has not had problems with anesthesia.   Past Medical History: Allergies: No Known Allergies  Current Medications:  Current Outpatient Medications:  .  calcium citrate-vitamin D (CITRACAL+D) 315-200 MG-UNIT tablet, Take 1 tablet by mouth daily., Disp: , Rfl:  .  Cyanocobalamin (VITAMIN B-12) 1000 MCG/15ML LIQD, Take 15 mLs by mouth daily., Disp: , Rfl:  .  etonogestrel-ethinyl estradiol (NUVARING) 0.12-0.015 MG/24HR vaginal ring, Insert vaginally and leave in place for 3 consecutive weeks, then remove for 1 week., Disp: 1 each, Rfl: 11 .  HYDROcodone-acetaminophen (NORCO) 5-325 MG tablet, Take 1 tablet by mouth every 8 (eight) hours as needed for up to 5 days for severe pain., Disp: 15 tablet, Rfl: 0 .  Multiple Vitamins-Minerals (HAIR SKIN NAILS PO), Take by mouth., Disp: , Rfl:  .  ondansetron (ZOFRAN) 4 MG tablet, Take 1 tablet (4 mg total) by mouth every 8 (eight) hours as needed for nausea or  vomiting., Disp: 20 tablet, Rfl: 0 .  OVER THE COUNTER MEDICATION, Take 2 tablets by mouth at bedtime., Disp: , Rfl:  .  Pediatric Multiple Vitamins (FLINTSTONES MULTIVITAMIN PO), Take 1 tablet by mouth daily., Disp: , Rfl:  .  valACYclovir (VALTREX) 500 MG tablet, Take 500 mg by mouth daily. As needed, Disp: , Rfl:   Past Medical Problems: Past Medical History:  Diagnosis Date  . Allergic rhinitis   . Encounter for insertion of mirena IUD   . GERD (gastroesophageal reflux disease)   . Gonorrhea   . Herpes genitalia   . High-risk pregnancy 01/17/2014   chorio/mild utering atony  . History of hiatal hernia   . History of syphilis   . Obesity   . Sleep apnea   . Syphilis     Past Surgical History: Past Surgical History:  Procedure Laterality Date  . FRACTURE SURGERY  2004   L left tibia  . LAPAROSCOPIC GASTRIC SLEEVE RESECTION N/A 07/25/2015   Procedure: LAPAROSCOPIC GASTRIC SLEEVE RESECTION;  Surgeon: Excell Seltzer, MD;  Location: [facility];  Service: General;  Laterality: N/A;    Social History: Social History   Socioeconomic History  . Marital status: Married    Spouse name: Kevon  . Number of children: 1  . Years of education: college  . Highest education level: Not on file  Occupational History  . Occupation: AR specialist- Billing  Employer: [employer]    Comment: [employer]  Tobacco Use  . Smoking status: Never Smoker  . Smokeless tobacco: Never Used  Substance and Sexual Activity  . Alcohol use: Yes    Alcohol/week: 0.0 standard drinks    Comment: not much now, just at holidays wine cooler  . Drug use: No  . Sexual activity: Yes    Birth control/protection: Inserts  Other Topics Concern  . Not on file  Social History Narrative   09/09/19   From: [place] - her mom moved her after [place] finished college   Living: with husband [name] and daughter [name]   Work: Medical illustrator - Insurance claims handler      Family: mom nearby      Enjoys: reading, spend  time with daughter - swimming, dancing      Exercise: not currently, planning to start exercise plan with sister   Diet: intermittent fasting       Safety   Seat belts: Yes    Guns: No   Safe in relationships: Yes    Social Determinants of Radio broadcast assistant Strain:   . Difficulty of Paying Living Expenses:   Food Insecurity:   . Worried About Charity fundraiser in the Last Year:   . Arboriculturist in the Last Year:   Transportation Needs:   . Film/video editor (Medical):   Marland Kitchen Lack of Transportation (Non-Medical):   Physical Activity:   . Days of Exercise per Week:   . Minutes of Exercise per Session:   Stress:   . Feeling of Stress :   Social Connections:   . Frequency of Communication with Friends and Family:   . Frequency of Social Gatherings with Friends and Family:   . Attends Religious Services:   . Active Member of Clubs or Organizations:   . Attends Archivist Meetings:   Marland Kitchen Marital Status:   Intimate Partner Violence:   . Fear of Current or Ex-Partner:   . Emotionally Abused:   Marland Kitchen Physically Abused:   . Sexually Abused:     Family History: Family History  Problem Relation Age of Onset  . Hypertension Father   . Hypertension Maternal Grandmother   . Diabetes Maternal Grandfather   . Hypertension Maternal Grandfather   . Other Cousin        breast augmentation 2/2 to pain    Review of Systems: Review of Systems  Constitutional: Negative for chills and fever.  HENT: Negative for congestion and sore throat.   Respiratory: Negative for cough and shortness of breath.   Cardiovascular: Negative for chest pain and palpitations.  Gastrointestinal: Negative for abdominal pain, nausea and vomiting.  Musculoskeletal: Positive for back pain and neck pain. Negative for joint pain and myalgias.  Skin: Negative for itching and rash.    Physical Exam: Vital Signs BP 128/84 (BP Location: Right Arm, Patient Position: Sitting, Cuff Size:  Large)   Pulse 100   Temp 98.4 F (36.9 C) (Temporal)   Ht 5\' 1"  (1.549 m)   Wt 246 lb (111.6 kg)   LMP 10/24/2019 (Exact Date)   SpO2 99%   BMI 46.48 kg/m  Physical Exam Constitutional:      Appearance: Normal appearance. She is normal weight.  HENT:     Head: Normocephalic and atraumatic.  Eyes:     Extraocular Movements: Extraocular movements intact.  Cardiovascular:     Rate and Rhythm: Normal rate and regular rhythm.  Pulses: Normal pulses.     Heart sounds: Normal heart sounds.  Pulmonary:     Effort: Pulmonary effort is normal.     Breath sounds: Normal breath sounds. No wheezing, rhonchi or rales.  Abdominal:     General: Bowel sounds are normal.     Palpations: Abdomen is soft.  Musculoskeletal:        General: No swelling. Normal range of motion.     Cervical back: Normal range of motion.  Skin:    General: Skin is warm and dry.     Coloration: Skin is not pale.     Findings: No erythema or rash.  Neurological:     General: No focal deficit present.     Mental Status: She is alert and oriented to person, place, and time.  Psychiatric:        Mood and Affect: Mood normal.        Behavior: Behavior normal.        Thought Content: Thought content normal.        Judgment: Judgment normal.     Assessment/Plan:  Ms. Faidley scheduled for bilateral breast reduction with Dr. Claudia Desanctis.  Risks, benefits, and alternatives of procedure discussed, questions answered and consent obtained.    Smoking Status: non-smoker; Counseling Given? N/A Last Mammogram: N/A - 34 years old.  Caprini Score: 4 Moderate; Risk Factors include: 34 yr-old female, BMI > 25, current swollen legs, and length of planned surgery. Recommendation for mechanical or pharmacological prophylaxis during surgery. Encourage early ambulation.   Pictures obtained: 10/06/19  Post-op Rx sent to pharmacy: Norco, Zofran  The risk that can be encountered with breast reduction were discussed and include  the following but not limited to these:  Breast asymmetry, fluid accumulation, firmness of the breast, inability to breast feed, loss of nipple or areola, skin loss, decrease or no nipple sensation, fat necrosis of the breast tissue, bleeding, infection, healing delay.  There are risks of anesthesia, changes to skin sensation and injury to nerves or blood vessels.  The muscle can be temporarily or permanently injured.  You may have an allergic reaction to tape, suture, glue, blood products which can result in skin discoloration, swelling, pain, skin lesions, poor healing.  Any of these can lead to the need for revisonal surgery or stage procedures.  A reduction has potential to interfere with diagnostic procedures.  Nipple or breast piercing can increase risks of infection.  This procedure is best done when the breast is fully developed.  Changes in the breast will continue to occur over time.  Pregnancy can alter the outcomes of previous breast reduction surgery, weight gain and weigh loss can also effect the long term appearance.   Patient was provided with breast reduction and general surgical risks consent documents prior to their appointment.  They had adequate time to read through the consent forms and we also discussed them in person together during this preop appointment.  All of their questions were answered to their content.  Recommended calling if they have any further questions.  Consent form to be scanned into patient's chart.  The [place] was signed into law in 2016 which includes the topic of electronic health records.  This provides immediate access to information in MyChart.  This includes consultation notes, operative notes, office notes, lab results and pathology reports.  If you have any questions about what you read please let us know at your next visit or call us at the office.  We are right here with you.   Electronically signed by: Threasa Heads, PA-C 10/28/2019 8:49  PM

## 2019-10-27 NOTE — H&P (View-Only) (Signed)
ICD-10-CM   1. Macromastia  N62       Patient ID: Nancy Owen, female    DOB: 1985/08/15, 34 y.o.   MRN: [id number]   History of Present Illness: Nancy Owen is a 34 y.o.  female  with a history of macromastia.  She presents for preoperative evaluation for upcoming procedure, bilateral breast reduction, scheduled for 11/02/19 with Dr. Claudia Desanctis.  Summary from previous visit: Patient's current breast size is 42 H.  She has grade 3 ptosis.  Sternal notch to nipple distance is 33 cm on the right and 34 cm on the left.  Nipple to IMF fold is 16 bilaterally.  Evidence of skin irritation and breakdown between her breasts.  Estimated tissue to remove 850 g from each side.  Patient reports she is currently a 42H and wound like to be a C cup.  She would be ok being on the smaller side of C.   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  PMH Significant for: Obesity, Sleep Apnea (reports procedure at time of her gastric sleeve resolved the sleep apnea).  The patient has not had problems with anesthesia.   Past Medical History: Allergies: No Known Allergies  Current Medications:  Current Outpatient Medications:  .  calcium citrate-vitamin D (CITRACAL+D) 315-200 MG-UNIT tablet, Take 1 tablet by mouth daily., Disp: , Rfl:  .  Cyanocobalamin (VITAMIN B-12) 1000 MCG/15ML LIQD, Take 15 mLs by mouth daily., Disp: , Rfl:  .  etonogestrel-ethinyl estradiol (NUVARING) 0.12-0.015 MG/24HR vaginal ring, Insert vaginally and leave in place for 3 consecutive weeks, then remove for 1 week., Disp: 1 each, Rfl: 11 .  HYDROcodone-acetaminophen (NORCO) 5-325 MG tablet, Take 1 tablet by mouth every 8 (eight) hours as needed for up to 5 days for severe pain., Disp: 15 tablet, Rfl: 0 .  Multiple Vitamins-Minerals (HAIR SKIN NAILS PO), Take by mouth., Disp: , Rfl:  .  ondansetron (ZOFRAN) 4 MG tablet, Take 1 tablet (4 mg total) by mouth every 8 (eight) hours as needed for nausea or  vomiting., Disp: 20 tablet, Rfl: 0 .  OVER THE COUNTER MEDICATION, Take 2 tablets by mouth at bedtime., Disp: , Rfl:  .  Pediatric Multiple Vitamins (FLINTSTONES MULTIVITAMIN PO), Take 1 tablet by mouth daily., Disp: , Rfl:  .  valACYclovir (VALTREX) 500 MG tablet, Take 500 mg by mouth daily. As needed, Disp: , Rfl:   Past Medical Problems: Past Medical History:  Diagnosis Date  . Allergic rhinitis   . Encounter for insertion of mirena IUD   . GERD (gastroesophageal reflux disease)   . Gonorrhea   . Herpes genitalia   . High-risk pregnancy 01/17/2014   chorio/mild utering atony  . History of hiatal hernia   . History of syphilis   . Obesity   . Sleep apnea   . Syphilis     Past Surgical History: Past Surgical History:  Procedure Laterality Date  . FRACTURE SURGERY  2004   L left tibia  . LAPAROSCOPIC GASTRIC SLEEVE RESECTION N/A 07/25/2015   Procedure: LAPAROSCOPIC GASTRIC SLEEVE RESECTION;  Surgeon: Excell Seltzer, MD;  Location: [facility];  Service: General;  Laterality: N/A;    Social History: Social History   Socioeconomic History  . Marital status: Married    Spouse name: Kevon  . Number of children: 1  . Years of education: college  . Highest education level: Not on file  Occupational History  . Occupation: AR specialist- Billing  Employer: [employer]    Comment: [employer]  Tobacco Use  . Smoking status: Never Smoker  . Smokeless tobacco: Never Used  Substance and Sexual Activity  . Alcohol use: Yes    Alcohol/week: 0.0 standard drinks    Comment: not much now, just at holidays wine cooler  . Drug use: No  . Sexual activity: Yes    Birth control/protection: Inserts  Other Topics Concern  . Not on file  Social History Narrative   09/09/19   From: [place] - her mom moved her after [place] finished college   Living: with husband [name] and daughter [name]   Work: Medical illustrator - Insurance claims handler      Family: mom nearby      Enjoys: reading, spend  time with daughter - swimming, dancing      Exercise: not currently, planning to start exercise plan with sister   Diet: intermittent fasting       Safety   Seat belts: Yes    Guns: No   Safe in relationships: Yes    Social Determinants of Radio broadcast assistant Strain:   . Difficulty of Paying Living Expenses:   Food Insecurity:   . Worried About Charity fundraiser in the Last Year:   . Arboriculturist in the Last Year:   Transportation Needs:   . Film/video editor (Medical):   Marland Kitchen Lack of Transportation (Non-Medical):   Physical Activity:   . Days of Exercise per Week:   . Minutes of Exercise per Session:   Stress:   . Feeling of Stress :   Social Connections:   . Frequency of Communication with Friends and Family:   . Frequency of Social Gatherings with Friends and Family:   . Attends Religious Services:   . Active Member of Clubs or Organizations:   . Attends Archivist Meetings:   Marland Kitchen Marital Status:   Intimate Partner Violence:   . Fear of Current or Ex-Partner:   . Emotionally Abused:   Marland Kitchen Physically Abused:   . Sexually Abused:     Family History: Family History  Problem Relation Age of Onset  . Hypertension Father   . Hypertension Maternal Grandmother   . Diabetes Maternal Grandfather   . Hypertension Maternal Grandfather   . Other Cousin        breast augmentation 2/2 to pain    Review of Systems: Review of Systems  Constitutional: Negative for chills and fever.  HENT: Negative for congestion and sore throat.   Respiratory: Negative for cough and shortness of breath.   Cardiovascular: Negative for chest pain and palpitations.  Gastrointestinal: Negative for abdominal pain, nausea and vomiting.  Musculoskeletal: Positive for back pain and neck pain. Negative for joint pain and myalgias.  Skin: Negative for itching and rash.    Physical Exam: Vital Signs BP 128/84 (BP Location: Right Arm, Patient Position: Sitting, Cuff Size:  Large)   Pulse 100   Temp 98.4 F (36.9 C) (Temporal)   Ht 5\' 1"  (1.549 m)   Wt 246 lb (111.6 kg)   LMP 10/24/2019 (Exact Date)   SpO2 99%   BMI 46.48 kg/m  Physical Exam Constitutional:      Appearance: Normal appearance. She is normal weight.  HENT:     Head: Normocephalic and atraumatic.  Eyes:     Extraocular Movements: Extraocular movements intact.  Cardiovascular:     Rate and Rhythm: Normal rate and regular rhythm.  Pulses: Normal pulses.     Heart sounds: Normal heart sounds.  Pulmonary:     Effort: Pulmonary effort is normal.     Breath sounds: Normal breath sounds. No wheezing, rhonchi or rales.  Abdominal:     General: Bowel sounds are normal.     Palpations: Abdomen is soft.  Musculoskeletal:        General: No swelling. Normal range of motion.     Cervical back: Normal range of motion.  Skin:    General: Skin is warm and dry.     Coloration: Skin is not pale.     Findings: No erythema or rash.  Neurological:     General: No focal deficit present.     Mental Status: She is alert and oriented to person, place, and time.  Psychiatric:        Mood and Affect: Mood normal.        Behavior: Behavior normal.        Thought Content: Thought content normal.        Judgment: Judgment normal.     Assessment/Plan:  Ms. Swofford scheduled for bilateral breast reduction with Dr. Claudia Desanctis.  Risks, benefits, and alternatives of procedure discussed, questions answered and consent obtained.    Smoking Status: non-smoker; Counseling Given? N/A Last Mammogram: N/A - 34 years old.  Caprini Score: 4 Moderate; Risk Factors include: 34 yr-old female, BMI > 25, current swollen legs, and length of planned surgery. Recommendation for mechanical or pharmacological prophylaxis during surgery. Encourage early ambulation.   Pictures obtained: 10/06/19  Post-op Rx sent to pharmacy: Norco, Zofran  The risk that can be encountered with breast reduction were discussed and include  the following but not limited to these:  Breast asymmetry, fluid accumulation, firmness of the breast, inability to breast feed, loss of nipple or areola, skin loss, decrease or no nipple sensation, fat necrosis of the breast tissue, bleeding, infection, healing delay.  There are risks of anesthesia, changes to skin sensation and injury to nerves or blood vessels.  The muscle can be temporarily or permanently injured.  You may have an allergic reaction to tape, suture, glue, blood products which can result in skin discoloration, swelling, pain, skin lesions, poor healing.  Any of these can lead to the need for revisonal surgery or stage procedures.  A reduction has potential to interfere with diagnostic procedures.  Nipple or breast piercing can increase risks of infection.  This procedure is best done when the breast is fully developed.  Changes in the breast will continue to occur over time.  Pregnancy can alter the outcomes of previous breast reduction surgery, weight gain and weigh loss can also effect the long term appearance.   Patient was provided with breast reduction and general surgical risks consent documents prior to their appointment.  They had adequate time to read through the consent forms and we also discussed them in person together during this preop appointment.  All of their questions were answered to their content.  Recommended calling if they have any further questions.  Consent form to be scanned into patient's chart.  The [place] was signed into law in 2016 which includes the topic of electronic health records.  This provides immediate access to information in MyChart.  This includes consultation notes, operative notes, office notes, lab results and pathology reports.  If you have any questions about what you read please let us know at your next visit or call us at the office.  We are right here with you.   Electronically signed by: Threasa Heads, PA-C 10/28/2019 8:49  PM

## 2019-10-28 ENCOUNTER — Other Ambulatory Visit: Payer: Self-pay

## 2019-10-28 ENCOUNTER — Encounter: Payer: Self-pay | Admitting: Plastic Surgery

## 2019-10-28 ENCOUNTER — Ambulatory Visit (INDEPENDENT_AMBULATORY_CARE_PROVIDER_SITE_OTHER): Payer: 59 | Admitting: Plastic Surgery

## 2019-10-28 VITALS — BP 128/84 | HR 100 | Temp 98.4°F | Ht 61.0 in | Wt 246.0 lb

## 2019-10-28 DIAGNOSIS — N62 Hypertrophy of breast: Secondary | ICD-10-CM

## 2019-10-28 MED ORDER — ONDANSETRON HCL 4 MG PO TABS: 4.0000 mg | ORAL_TABLET | Freq: Three times a day (TID) | ORAL | 0 refills | Status: DC | PRN

## 2019-10-28 MED ORDER — HYDROCODONE-ACETAMINOPHEN 5-325 MG PO TABS: 1.0000 | ORAL_TABLET | Freq: Three times a day (TID) | ORAL | 0 refills | Status: AC | PRN

## 2019-10-29 ENCOUNTER — Other Ambulatory Visit: Admission: RE | Admit: 2019-10-29 | Payer: 59 | Source: Ambulatory Visit

## 2019-10-29 ENCOUNTER — Other Ambulatory Visit (HOSPITAL_COMMUNITY)
Admission: RE | Admit: 2019-10-29 | Discharge: 2019-10-29 | Disposition: A | Discharge status: Home / Self Care | Payer: 59 | Source: Ambulatory Visit | Attending: Plastic Surgery | Admitting: Plastic Surgery

## 2019-10-29 DIAGNOSIS — Z01812 Encounter for preprocedural laboratory examination: Secondary | ICD-10-CM | POA: Insufficient documentation

## 2019-10-29 DIAGNOSIS — Z20822 Contact with and (suspected) exposure to covid-19: Secondary | ICD-10-CM | POA: Diagnosis not present

## 2019-10-29 LAB — SARS CORONAVIRUS 2 (TAT 6-24 HRS): SARS Coronavirus 2: NEGATIVE

## 2019-10-29 NOTE — Progress Notes (Signed)
Anesthesia consult per Dr. Doroteo Glassman, will proceed with surgery as scheduled.

## 2019-10-29 NOTE — Anesthesia Preprocedure Evaluation (Addendum)
Anesthesia Evaluation  Patient identified by MRN, date of birth, ID band Patient awake    Reviewed: Allergy & Precautions, NPO status , Patient's Chart, lab work & pertinent test results  Airway Mallampati: II  TM Distance: >3 FB Neck ROM: Full  Mouth opening: Limited Mouth Opening  Dental  (+) Teeth Intact, Dental Advisory Given   Pulmonary neg pulmonary ROS, sleep apnea , neg pneumonia ,  Last sleep study 2016- has not used since 2018, but states she will start again preop  Has lost some weight since that sleep study d/t gastric sleeve in 2017- down a net of 50lbs   Pulmonary exam normal        Cardiovascular negative cardio ROS Normal cardiovascular exam Rhythm:Regular Rate:Normal     Neuro/Psych negative neurological ROS  negative psych ROS   GI/Hepatic negative GI ROS, Neg liver ROS, hiatal hernia, GERD  Controlled,  Endo/Other  negative endocrine ROSMorbid obesityBMI 47   Renal/GU negative Renal ROS  negative genitourinary   Musculoskeletal negative musculoskeletal ROS (+)   Abdominal (+) + obese,   Peds negative pediatric ROS (+)  Hematology negative hematology ROS (+)   Anesthesia Other Findings   Reproductive/Obstetrics negative OB ROS                            Anesthesia Physical Anesthesia Plan  ASA: III  Anesthesia Plan: General   Post-op Pain Management:    Induction: Intravenous  PONV Risk Score and Plan: 3 and Ondansetron, Dexamethasone, Midazolam and Treatment may vary due to age or medical condition  Airway Management Planned: LMA and Oral ETT  Additional Equipment:   Intra-op Plan:   Post-operative Plan: Extubation in OR  Informed Consent: I have reviewed the patients History and Physical, chart, labs and discussed the procedure including the risks, benefits and alternatives for the proposed anesthesia with the patient or authorized representative who has  indicated his/her understanding and acceptance.     Dental advisory given  Plan Discussed with: CRNA  Anesthesia Plan Comments: (PAT visit for BMI/airway check- nml airway, limited mouth opening due to small cut on her lip- I suspect normal mouth opening once she is asleep Advised to bring her CPAP on day of surgery )       Anesthesia Quick Evaluation

## 2019-11-02 ENCOUNTER — Ambulatory Visit (HOSPITAL_BASED_OUTPATIENT_CLINIC_OR_DEPARTMENT_OTHER)
Admission: RE | Admit: 2019-11-02 | Discharge: 2019-11-02 | Disposition: A | Discharge status: Home / Self Care | Payer: No Typology Code available for payment source | Attending: Plastic Surgery | Admitting: Plastic Surgery

## 2019-11-02 ENCOUNTER — Encounter (HOSPITAL_BASED_OUTPATIENT_CLINIC_OR_DEPARTMENT_OTHER): Payer: Self-pay | Admitting: Plastic Surgery

## 2019-11-02 ENCOUNTER — Ambulatory Visit (HOSPITAL_BASED_OUTPATIENT_CLINIC_OR_DEPARTMENT_OTHER): Payer: No Typology Code available for payment source | Admitting: Anesthesiology

## 2019-11-02 ENCOUNTER — Encounter (HOSPITAL_BASED_OUTPATIENT_CLINIC_OR_DEPARTMENT_OTHER)
Admission: RE | Disposition: A | Discharge status: Home / Self Care | Payer: Self-pay | Source: Home / Self Care | Attending: Plastic Surgery

## 2019-11-02 ENCOUNTER — Other Ambulatory Visit: Payer: Self-pay

## 2019-11-02 DIAGNOSIS — K219 Gastro-esophageal reflux disease without esophagitis: Secondary | ICD-10-CM | POA: Insufficient documentation

## 2019-11-02 DIAGNOSIS — N62 Hypertrophy of breast: Secondary | ICD-10-CM | POA: Diagnosis present

## 2019-11-02 DIAGNOSIS — Z79899 Other long term (current) drug therapy: Secondary | ICD-10-CM | POA: Diagnosis not present

## 2019-11-02 DIAGNOSIS — K449 Diaphragmatic hernia without obstruction or gangrene: Secondary | ICD-10-CM | POA: Insufficient documentation

## 2019-11-02 DIAGNOSIS — Z9884 Bariatric surgery status: Secondary | ICD-10-CM | POA: Diagnosis not present

## 2019-11-02 DIAGNOSIS — E669 Obesity, unspecified: Secondary | ICD-10-CM | POA: Insufficient documentation

## 2019-11-02 DIAGNOSIS — Z6841 Body Mass Index (BMI) 40.0 and over, adult: Secondary | ICD-10-CM | POA: Insufficient documentation

## 2019-11-02 DIAGNOSIS — G4730 Sleep apnea, unspecified: Secondary | ICD-10-CM | POA: Insufficient documentation

## 2019-11-02 HISTORY — PX: BREAST REDUCTION SURGERY: SHX8

## 2019-11-02 LAB — POCT PREGNANCY, URINE: Preg Test, Ur: NEGATIVE

## 2019-11-02 SURGERY — MAMMOPLASTY, REDUCTION
Anesthesia: General | Site: Breast | Laterality: Bilateral

## 2019-11-02 MED ORDER — EPHEDRINE 5 MG/ML INJ
INTRAVENOUS | Status: AC
  Filled 2019-11-02: qty 10

## 2019-11-02 MED ORDER — PHENYLEPHRINE 40 MCG/ML (10ML) SYRINGE FOR IV PUSH (FOR BLOOD PRESSURE SUPPORT)
PREFILLED_SYRINGE | INTRAVENOUS | Status: AC
  Filled 2019-11-02: qty 10

## 2019-11-02 MED ORDER — DIPHENHYDRAMINE HCL 50 MG/ML IJ SOLN
INTRAMUSCULAR | Status: AC
  Filled 2019-11-02: qty 1

## 2019-11-02 MED ORDER — MIDAZOLAM HCL 2 MG/2ML IJ SOLN
INTRAMUSCULAR | Status: AC
  Filled 2019-11-02: qty 2

## 2019-11-02 MED ORDER — CEFAZOLIN SODIUM-DEXTROSE 2-4 GM/100ML-% IV SOLN
2.0000 g | INTRAVENOUS | Status: AC
  Administered 2019-11-02: 2 g via INTRAVENOUS

## 2019-11-02 MED ORDER — DIPHENHYDRAMINE HCL 50 MG/ML IJ SOLN
INTRAMUSCULAR | Status: DC | PRN
  Administered 2019-11-02: 12.5 mg via INTRAVENOUS

## 2019-11-02 MED ORDER — ONDANSETRON HCL 4 MG/2ML IJ SOLN
INTRAMUSCULAR | Status: DC | PRN
  Administered 2019-11-02: 4 mg via INTRAVENOUS

## 2019-11-02 MED ORDER — ROCURONIUM BROMIDE 100 MG/10ML IV SOLN
INTRAVENOUS | Status: DC | PRN
  Administered 2019-11-02: 100 mg via INTRAVENOUS

## 2019-11-02 MED ORDER — SUGAMMADEX SODIUM 500 MG/5ML IV SOLN
INTRAVENOUS | Status: DC | PRN
  Administered 2019-11-02: 350 mg via INTRAVENOUS

## 2019-11-02 MED ORDER — DEXAMETHASONE SODIUM PHOSPHATE 4 MG/ML IJ SOLN
INTRAMUSCULAR | Status: DC | PRN
  Administered 2019-11-02: 10 mg via INTRAVENOUS

## 2019-11-02 MED ORDER — OXYCODONE HCL 5 MG/5ML PO SOLN: 5.0000 mg | Freq: Once | ORAL | Status: AC | PRN

## 2019-11-02 MED ORDER — CEFAZOLIN SODIUM-DEXTROSE 2-4 GM/100ML-% IV SOLN
INTRAVENOUS | Status: AC
  Filled 2019-11-02: qty 100

## 2019-11-02 MED ORDER — OXYCODONE HCL 5 MG PO TABS
ORAL_TABLET | ORAL | Status: AC
  Filled 2019-11-02: qty 1

## 2019-11-02 MED ORDER — ONDANSETRON HCL 4 MG/2ML IJ SOLN
INTRAMUSCULAR | Status: AC
  Filled 2019-11-02: qty 2

## 2019-11-02 MED ORDER — LIDOCAINE HCL (CARDIAC) PF 100 MG/5ML IV SOSY
PREFILLED_SYRINGE | INTRAVENOUS | Status: DC | PRN
  Administered 2019-11-02: 100 mg via INTRAVENOUS

## 2019-11-02 MED ORDER — PROPOFOL 10 MG/ML IV BOLUS
INTRAVENOUS | Status: DC | PRN
  Administered 2019-11-02: 150 mg via INTRAVENOUS

## 2019-11-02 MED ORDER — SUGAMMADEX SODIUM 500 MG/5ML IV SOLN
INTRAVENOUS | Status: AC
  Filled 2019-11-02: qty 5

## 2019-11-02 MED ORDER — MEPERIDINE HCL 25 MG/ML IJ SOLN: 6.2500 mg | INTRAMUSCULAR | Status: DC | PRN

## 2019-11-02 MED ORDER — OXYCODONE HCL 5 MG PO TABS
5.0000 mg | ORAL_TABLET | Freq: Once | ORAL | Status: AC | PRN
  Administered 2019-11-02: 5 mg via ORAL

## 2019-11-02 MED ORDER — ROCURONIUM BROMIDE 10 MG/ML (PF) SYRINGE
PREFILLED_SYRINGE | INTRAVENOUS | Status: AC
  Filled 2019-11-02: qty 10

## 2019-11-02 MED ORDER — HYDROMORPHONE HCL 1 MG/ML IJ SOLN
0.2500 mg | INTRAMUSCULAR | Status: DC | PRN
  Administered 2019-11-02: 0.5 mg via INTRAVENOUS

## 2019-11-02 MED ORDER — LACTATED RINGERS IV SOLN
INTRAVENOUS | Status: DC | PRN
  Administered 2019-11-02 (×2): 350 mL

## 2019-11-02 MED ORDER — LIDOCAINE 2% (20 MG/ML) 5 ML SYRINGE
INTRAMUSCULAR | Status: AC
  Filled 2019-11-02: qty 5

## 2019-11-02 MED ORDER — LACTATED RINGERS IV SOLN: INTRAVENOUS | Status: DC

## 2019-11-02 MED ORDER — SUFENTANIL CITRATE 50 MCG/ML IV SOLN
INTRAVENOUS | Status: AC
  Filled 2019-11-02: qty 1

## 2019-11-02 MED ORDER — PROMETHAZINE HCL 25 MG/ML IJ SOLN: 6.2500 mg | INTRAMUSCULAR | Status: DC | PRN

## 2019-11-02 MED ORDER — PROPOFOL 500 MG/50ML IV EMUL
INTRAVENOUS | Status: AC
  Filled 2019-11-02: qty 50

## 2019-11-02 MED ORDER — SUFENTANIL CITRATE 50 MCG/ML IV SOLN
INTRAVENOUS | Status: DC | PRN
  Administered 2019-11-02: 5 ug via INTRAVENOUS
  Administered 2019-11-02 (×2): 10 ug via INTRAVENOUS
  Administered 2019-11-02 (×3): 5 ug via INTRAVENOUS

## 2019-11-02 MED ORDER — DEXAMETHASONE SODIUM PHOSPHATE 10 MG/ML IJ SOLN
INTRAMUSCULAR | Status: AC
  Filled 2019-11-02: qty 1

## 2019-11-02 MED ORDER — PHENYLEPHRINE HCL (PRESSORS) 10 MG/ML IV SOLN
INTRAVENOUS | Status: DC | PRN
  Administered 2019-11-02 (×4): 80 ug via INTRAVENOUS

## 2019-11-02 MED ORDER — SUCCINYLCHOLINE CHLORIDE 200 MG/10ML IV SOSY
PREFILLED_SYRINGE | INTRAVENOUS | Status: AC
  Filled 2019-11-02: qty 10

## 2019-11-02 MED ORDER — HYDROMORPHONE HCL 1 MG/ML IJ SOLN
INTRAMUSCULAR | Status: AC
  Filled 2019-11-02: qty 0.5

## 2019-11-02 SURGICAL SUPPLY — 71 items
BAG DECANTER FOR FLEXI CONT (MISCELLANEOUS) ×3 IMPLANT
BENZOIN TINCTURE PRP APPL 2/3 (GAUZE/BANDAGES/DRESSINGS) ×6 IMPLANT
BLADE SURG 10 STRL SS (BLADE) ×15 IMPLANT
BLADE SURG 15 STRL LF DISP TIS (BLADE) ×2 IMPLANT
BLADE SURG 15 STRL SS (BLADE) ×6
BNDG ELASTIC 6X5.8 VLCR STR LF (GAUZE/BANDAGES/DRESSINGS) ×3 IMPLANT
BNDG GAUZE ELAST 4 BULKY (GAUZE/BANDAGES/DRESSINGS) ×6 IMPLANT
CANISTER SUCT 1200ML W/VALVE (MISCELLANEOUS) ×3 IMPLANT
CHLORAPREP W/TINT 26 (MISCELLANEOUS) ×6 IMPLANT
CLIP VESOCCLUDE MED 6/CT (CLIP) IMPLANT
COVER BACK TABLE 60X90IN (DRAPES) ×3 IMPLANT
COVER MAYO STAND STRL (DRAPES) ×3 IMPLANT
COVER WAND RF STERILE (DRAPES) IMPLANT
DECANTER SPIKE VIAL GLASS SM (MISCELLANEOUS) IMPLANT
DRAIN CHANNEL 15F RND FF W/TCR (WOUND CARE) IMPLANT
DRAPE LAPAROSCOPIC ABDOMINAL (DRAPES) ×3 IMPLANT
DRAPE UTILITY XL STRL (DRAPES) ×3 IMPLANT
DRSG PAD ABDOMINAL 8X10 ST (GAUZE/BANDAGES/DRESSINGS) ×6 IMPLANT
ELECT REM PT RETURN 9FT ADLT (ELECTROSURGICAL) ×3
ELECTRODE REM PT RTRN 9FT ADLT (ELECTROSURGICAL) ×1 IMPLANT
EVACUATOR SILICONE 100CC (DRAIN) IMPLANT
GAUZE SPONGE 4X4 12PLY STRL (GAUZE/BANDAGES/DRESSINGS) ×6 IMPLANT
GLOVE BIO SURGEON STRL SZ 6.5 (GLOVE) IMPLANT
GLOVE BIO SURGEON STRL SZ7.5 (GLOVE) IMPLANT
GLOVE BIO SURGEONS STRL SZ 6.5 (GLOVE)
GLOVE BIOGEL M STRL SZ7.5 (GLOVE) ×3 IMPLANT
GLOVE BIOGEL PI IND STRL 7.5 (GLOVE) ×2 IMPLANT
GLOVE BIOGEL PI IND STRL 8 (GLOVE) ×2 IMPLANT
GLOVE BIOGEL PI INDICATOR 7.5 (GLOVE) ×4
GLOVE BIOGEL PI INDICATOR 8 (GLOVE) ×4
GLOVE ECLIPSE 6.5 STRL STRAW (GLOVE) IMPLANT
GLOVE ECLIPSE 8.0 STRL XLNG CF (GLOVE) ×6 IMPLANT
GLOVE SURG SS PI 7.0 STRL IVOR (GLOVE) ×12 IMPLANT
GOWN STRL REUS W/ TWL LRG LVL3 (GOWN DISPOSABLE) ×2 IMPLANT
GOWN STRL REUS W/ TWL XL LVL3 (GOWN DISPOSABLE) ×5 IMPLANT
GOWN STRL REUS W/TWL LRG LVL3 (GOWN DISPOSABLE) ×6
GOWN STRL REUS W/TWL XL LVL3 (GOWN DISPOSABLE) ×15
MARKER SKIN DUAL TIP RULER LAB (MISCELLANEOUS) ×3 IMPLANT
NDL SAFETY ECLIPSE 18X1.5 (NEEDLE) IMPLANT
NEEDLE FILTER BLUNT 18X 1/2SAF (NEEDLE) ×2
NEEDLE FILTER BLUNT 18X1 1/2 (NEEDLE) ×1 IMPLANT
NEEDLE HYPO 18GX1.5 SHARP (NEEDLE)
NEEDLE HYPO 25X1 1.5 SAFETY (NEEDLE) IMPLANT
NEEDLE SPNL 18GX3.5 QUINCKE PK (NEEDLE) ×3 IMPLANT
NS IRRIG 1000ML POUR BTL (IV SOLUTION) ×3 IMPLANT
PACK BASIN DAY SURGERY FS (CUSTOM PROCEDURE TRAY) ×3 IMPLANT
PENCIL SMOKE EVACUATOR (MISCELLANEOUS) ×3 IMPLANT
PIN SAFETY STERILE (MISCELLANEOUS) IMPLANT
SHEET MEDIUM DRAPE 40X70 STRL (DRAPES) IMPLANT
SLEEVE SCD COMPRESS KNEE MED (MISCELLANEOUS) ×3 IMPLANT
SPONGE LAP 18X18 RF (DISPOSABLE) ×9 IMPLANT
STAPLER INSORB 30 2030 C-SECTI (MISCELLANEOUS) ×6 IMPLANT
STAPLER VISISTAT 35W (STAPLE) ×9 IMPLANT
STRIP SUTURE WOUND CLOSURE 1/2 (MISCELLANEOUS) ×9 IMPLANT
SUT ETHILON 2 0 FS 18 (SUTURE) IMPLANT
SUT MNCRL AB 4-0 PS2 18 (SUTURE) ×6 IMPLANT
SUT PDS 3-0 CT2 (SUTURE) ×6
SUT PDS II 3-0 CT2 27 ABS (SUTURE) ×2 IMPLANT
SUT VIC AB 3-0 PS1 18 (SUTURE)
SUT VIC AB 3-0 PS1 18XBRD (SUTURE) IMPLANT
SUT VLOC 90 P-14 23 (SUTURE) ×12 IMPLANT
SYR 50ML LL SCALE MARK (SYRINGE) ×6 IMPLANT
SYR BULB IRRIGATION 50ML (SYRINGE) ×3 IMPLANT
SYR CONTROL 10ML LL (SYRINGE) IMPLANT
TAPE MEASURE VINYL STERILE (MISCELLANEOUS) IMPLANT
TOWEL GREEN STERILE FF (TOWEL DISPOSABLE) ×6 IMPLANT
TRAY FOLEY W/BAG SLVR 14FR LF (SET/KITS/TRAYS/PACK) IMPLANT
TUBE CONNECTING 20'X1/4 (TUBING) ×1
TUBE CONNECTING 20X1/4 (TUBING) ×2 IMPLANT
UNDERPAD 30X36 HEAVY ABSORB (UNDERPADS AND DIAPERS) ×6 IMPLANT
YANKAUER SUCT BULB TIP NO VENT (SUCTIONS) ×3 IMPLANT

## 2019-11-02 NOTE — Op Note (Signed)
Operative Note   DATE OF OPERATION: 11/02/2019  LOCATION: [facility]   SURGICAL DEPARTMENT: Plastic Surgery  PREOPERATIVE DIAGNOSES: Bilateral symptomatic macromastia.  POSTOPERATIVE DIAGNOSES:  same  PROCEDURE: Bilateral breast reduction with inferior pedicle.  SURGEON: Talmadge Coventry, MD  ASSISTANT: Phoebe Sharps, PA The advanced practice practitioner (APP) assisted throughout the case.  The APP was essential in retraction and counter traction when needed to make the case progress smoothly.  This retraction and assistance made it possible to see the tissue plans for the procedure.  The assistance was needed for blood control, tissue re-approximation and assisted with closure of the incision site.  ANESTHESIA: General.  COMPLICATIONS: None.   INDICATIONS FOR PROCEDURE:  The patient, Nancy Owen is a 34 y.o. female born on 04-Apr-1986, is here for treatment of bilateral symptomatic macromastia. MRN: [id number]  CONSENT:  Informed consent was obtained directly from the patient. Risks, benefits and alternatives were fully discussed. Specific risks including but not limited to bleeding, infection, hematoma, seroma, scarring, pain, infection, contracture, asymmetry, wound healing problems, and need for further surgery were all discussed. The patient did have an ample opportunity to have questions answered to satisfaction.   DESCRIPTION OF PROCEDURE:  The patient was marked preoperatively for a Wise pattern skin excision.  The patient was taken to the operating room. SCDs were placed and antibiotics were given. General anesthesia was administered.The patient's operative site was prepped and draped in a sterile fashion. A time out was performed and all information was confirmed to be correct.  Right Breast: The breast was infiltrated with tumescent solution to help with hemostasis.  The nipple was marked with a cookie cutter.  An inferior pedicle was drawn out with  the base of at least 8 cm in size.  A breast tourniquet was then applied and the pedicle was de-epithelialized.  Breast tourniquet was then let down and all incisions were made with a 10 blade.  The pedicle was then isolated down to the chest wall with cautery and the excision was performed removing tissue primarily superiorly and laterally.  Hemostasis was obtained and the wound was stapled closed.  Left breast:  The breast was infiltrated with tumescent solution to help with hemostasis.  The nipple was marked with a cookie cutter.  An inferior pedicle was drawn out with the base of at least 8 cm in size.  A breast tourniquet was then applied and the pedicle was de-epithelialized.  Breast tourniquet was then let down and all incisions were made with a 10 blade.  The pedicle was then isolated down to the chest wall with cautery and the excision was performed removing tissue primarily superiorly and laterally.  Hemostasis was obtained and the wound was stapled closed.  Patient was then set up to check for size and symmetry.  Minor modifications were made.  This resulted in a total of 1135g removed from the right side and 1245g removed from the left side.  The inframammary incision was closed with a combination of buried in-sorb staples and a running 3-0 Quill suture.  The vertical and periareolar limbs were closed with interrupted buried 4-0 Monocryl and a running 4-0 Quill suture.  Steri-Strips were then applied along with a soft dressing and Ace wrap.  The patient tolerated the procedure well.  There were no complications. The patient was allowed to wake from anesthesia, extubated and taken to the recovery room in satisfactory condition.  I was present for the entire procedure.

## 2019-11-02 NOTE — Brief Op Note (Signed)
11/02/2019  1:46 PM  PATIENT:  Nancy Owen  34 y.o. female  PRE-OPERATIVE DIAGNOSIS:  macromastia  POST-OPERATIVE DIAGNOSIS:  macromastia  PROCEDURE:  Procedure(s) with comments: MAMMARY REDUCTION  (BREAST) (Bilateral) - 3 hours  SURGEON:  Surgeon(s) and Role:    * Tianna Baus, Steffanie Dunn, MD - Primary  PHYSICIAN ASSISTANT: Phoebe Sharps, PA  ASSISTANTS: none   ANESTHESIA:   general  EBL:  50   BLOOD ADMINISTERED:none  DRAINS: none   LOCAL MEDICATIONS USED:  MARCAINE     SPECIMEN:  Source of Specimen:  r and l breast tissue  DISPOSITION OF SPECIMEN:  PATHOLOGY  COUNTS:  YES  TOURNIQUET:  * No tourniquets in log *  DICTATION: .Dragon Dictation  PLAN OF CARE: Discharge to home after PACU  PATIENT DISPOSITION:  PACU - hemodynamically stable.   Delay start of Pharmacological VTE agent (>24hrs) due to surgical blood loss or risk of bleeding: not applicable

## 2019-11-02 NOTE — Interval H&P Note (Signed)
History and Physical Interval Note:  11/02/2019 10:41 AM  Nancy Owen  has presented today for surgery, with the diagnosis of macromastia.  The various methods of treatment have been discussed with the patient and family. After consideration of risks, benefits and other options for treatment, the patient has consented to  Procedure(s) with comments: MAMMARY REDUCTION  (BREAST) (Bilateral) - 3 hours as a surgical intervention.  The patient's history has been reviewed, patient examined, no change in status, stable for surgery.  I have reviewed the patient's chart and labs.  Questions were answered to the patient's satisfaction.     Cindra Presume

## 2019-11-02 NOTE — Anesthesia Postprocedure Evaluation (Signed)
Anesthesia Post Note  Patient: Nancy Owen  Procedure(s) Performed: MAMMARY REDUCTION  (BREAST) (Bilateral Breast)     Patient location during evaluation: PACU Anesthesia Type: General Level of consciousness: awake and alert Pain management: pain level controlled Vital Signs Assessment: post-procedure vital signs reviewed and stable Respiratory status: spontaneous breathing, nonlabored ventilation and respiratory function stable Cardiovascular status: blood pressure returned to baseline and stable Postop Assessment: no apparent nausea or vomiting Anesthetic complications: no    Last Vitals:  Vitals:   11/02/19 1500 11/02/19 1515  BP: 114/81 117/78  Pulse: 97 96  Resp: 20 (!) 21  Temp:    SpO2: 100% 100%    Last Pain:  Vitals:   11/02/19 1515  TempSrc:   PainSc: 10-Worst pain ever                 Lynda Rainwater

## 2019-11-02 NOTE — Discharge Instructions (Signed)
Activity As tolerated: NO showers for 3 days No heavy activities  Diet: Regular  Wound Care: Keep dressing clean & dry for 3 days.  Keep wrap applied with compression as much as possible.    Do not change dressings for 3 days unless soiled.  Can change if needed but make sure to reapply wrap. After three days can remove wrap and shower.  Then reapply dressings if needed and continue compression with wrap or soft sports bra. Call doctor if any unusual problems occur such as pain, excessive bleeding, unrelieved nausea/vomiting, fever &/or chills  Follow-up appointment: Scheduled for next week.  *You had 5 mg of oxycodone at 4:30pm   Post Anesthesia Home Care Instructions  Activity: Get plenty of rest for the remainder of the day. A responsible individual must stay with you for 24 hours following the procedure.  For the next 24 hours, DO NOT: -Drive a car -Paediatric nurse -Drink alcoholic beverages -Take any medication unless instructed by your physician -Make any legal decisions or sign important papers.  Meals: Start with liquid foods such as gelatin or soup. Progress to regular foods as tolerated. Avoid greasy, spicy, heavy foods. If nausea and/or vomiting occur, drink only clear liquids until the nausea and/or vomiting subsides. Call your physician if vomiting continues.  Special Instructions/Symptoms: Your throat may feel dry or sore from the anesthesia or the breathing tube placed in your throat during surgery. If this causes discomfort, gargle with warm salt water. The discomfort should disappear within 24 hours.  If you had a scopolamine patch placed behind your ear for the management of post- operative nausea and/or vomiting:  1. The medication in the patch is effective for 72 hours, after which it should be removed.  Wrap patch in a tissue and discard in the trash. Wash hands thoroughly with soap and water. 2. You may remove the patch earlier than 72 hours if you  experience unpleasant side effects which may include dry mouth, dizziness or visual disturbances. 3. Avoid touching the patch. Wash your hands with soap and water after contact with the patch.

## 2019-11-02 NOTE — Transfer of Care (Signed)
Immediate Anesthesia Transfer of Care Note  Patient: Nancy Owen  Procedure(s) Performed: MAMMARY REDUCTION  (BREAST) (Bilateral Breast)  Patient Location: PACU  Anesthesia Type:General  Level of Consciousness: awake, alert  and oriented  Airway & Oxygen Therapy: Patient Spontanous Breathing and Patient connected to face mask oxygen  Post-op Assessment: Report given to RN and Post -op Vital signs reviewed and stable  Post vital signs: Reviewed and stable  Last Vitals:  Vitals Value Taken Time  BP 101/67 11/02/19 1430  Temp    Pulse 104 11/02/19 1434  Resp 21 11/02/19 1434  SpO2 97 % 11/02/19 1434  Vitals shown include unvalidated device data.  Last Pain:  Vitals:   11/02/19 1038  TempSrc: Temporal  PainSc: 0-No pain      Patients Stated Pain Goal: 5 ([id number])  Complications: No apparent anesthesia complications

## 2019-11-02 NOTE — Anesthesia Procedure Notes (Signed)
Procedure Name: Intubation Date/Time: 11/02/2019 11:16 AM Performed by: Willa Frater, CRNA Pre-anesthesia Checklist: Patient identified, Emergency Drugs available, Suction available and Patient being monitored Patient Re-evaluated:Patient Re-evaluated prior to induction Oxygen Delivery Method: Circle system utilized Preoxygenation: Pre-oxygenation with 100% oxygen Induction Type: IV induction Ventilation: Mask ventilation without difficulty Tube type: Oral Tube size: 7.0 mm Number of attempts: 3 Airway Equipment and Method: Oral airway and Bougie stylet Placement Confirmation: ETT inserted through vocal cords under direct vision,  positive ETCO2 and breath sounds checked- equal and bilateral Secured at: 22 cm Tube secured with: Tape Dental Injury: Teeth and Oropharynx as per pre-operative assessment  Difficulty Due To: Difficult Airway- due to anterior larynx, Difficult Airway- due to limited oral opening, Difficult Airway- due to dentition and Difficulty was unanticipated

## 2019-11-03 ENCOUNTER — Encounter: Payer: Self-pay | Admitting: *Deleted

## 2019-11-04 LAB — SURGICAL PATHOLOGY

## 2019-11-10 ENCOUNTER — Encounter: Payer: Self-pay | Admitting: Plastic Surgery

## 2019-11-10 ENCOUNTER — Encounter: Payer: 59 | Admitting: Plastic Surgery

## 2019-11-10 ENCOUNTER — Other Ambulatory Visit: Payer: Self-pay

## 2019-11-10 ENCOUNTER — Ambulatory Visit (INDEPENDENT_AMBULATORY_CARE_PROVIDER_SITE_OTHER): Payer: 59 | Admitting: Plastic Surgery

## 2019-11-10 VITALS — BP 106/73 | HR 101 | Temp 98.5°F | Ht 61.0 in | Wt 240.0 lb

## 2019-11-10 DIAGNOSIS — N62 Hypertrophy of breast: Secondary | ICD-10-CM

## 2019-11-10 MED ORDER — HYDROCODONE-ACETAMINOPHEN 5-325 MG PO TABS: 1.0000 | ORAL_TABLET | ORAL | 0 refills | Status: DC | PRN

## 2019-11-10 NOTE — Progress Notes (Signed)
Patient is here 1 week postop from bilateral breast reduction.  She claims to be having quite a bit of pain on the right side laterally.  She feels like she is a bit more swollen and tender in that area.  Otherwise she has been doing well.  On exam all of her incisions are intact with no signs of any problems.  The nipple areolar complexes are viable bilaterally.  She does feel a bit more swollen and firm laterally on the right side so I attempted to aspirate this area but did not get any fluid back.  I encouraged her to continue to wear a suppressive garment and let us know if things get any worse.  I have refilled her pain medication and explained that this would be the last narcotic pain medication that I could fill.  I encouraged her to continue to take ibuprofen along with this.  Barring any changes we will plan to see her again in 2 weeks.

## 2019-11-17 ENCOUNTER — Ambulatory Visit: Payer: 59 | Attending: Internal Medicine

## 2019-11-17 DIAGNOSIS — Z23 Encounter for immunization: Secondary | ICD-10-CM

## 2019-11-17 NOTE — Progress Notes (Signed)
   Covid-19 Vaccination Clinic  Name:  Nancy Owen    MRN: [id number] DOB: March 17, 1986  11/17/2019  [name] was observed post Covid-19 immunization for 15 minutes without incident. She was provided with Vaccine Information Sheet and instruction to access the V-Safe system.   [name] was instructed to call 911 with any severe reactions post vaccine: Marland Kitchen Difficulty breathing  . Swelling of face and throat  . A fast heartbeat  . A bad rash all over body  . Dizziness and weakness   Immunizations Administered    Name Date Dose VIS Date Route   Pfizer COVID-19 Vaccine 11/17/2019  8:36 AM 0.3 mL 09/08/2018 Intramuscular   Manufacturer: [place]   Lot: [id number]   [place]: [id number]

## 2019-11-21 NOTE — Progress Notes (Signed)
Patient is a 34 year old female here for follow-up after undergoing bilateral breast reduction with inferior pedicle on 11/02/2019 with Dr. Claudia Desanctis.  3 weeks PO. Incisions are healing well, C/D/I.  No signs of infection, redness, drainage, seroma or hematoma.  Swelling on lateral right breast has improved since last visit.    Continue wearing sports bra 24/7.  May shower normally.  Continue to avoid heavy lifting for at least 1 more week and then may gradually increase weight as tolerated.  May apply Vaseline to incisions and breast skin for moisture.  Follow-up in 3 weeks.  Call office with any questions/concerns  The 21st Century Cures Act was signed into law in 2016 which includes the topic of electronic health records.  This provides immediate access to information in MyChart.  This includes consultation notes, operative notes, office notes, lab results and pathology reports.  If you have any questions about what you read please let us know at your next visit or call us at the office.  We are right here with you.

## 2019-11-24 ENCOUNTER — Other Ambulatory Visit: Payer: Self-pay

## 2019-11-24 ENCOUNTER — Encounter: Payer: Self-pay | Admitting: Plastic Surgery

## 2019-11-24 ENCOUNTER — Ambulatory Visit (INDEPENDENT_AMBULATORY_CARE_PROVIDER_SITE_OTHER): Payer: 59 | Admitting: Plastic Surgery

## 2019-11-24 ENCOUNTER — Ambulatory Visit: Payer: 59 | Admitting: Plastic Surgery

## 2019-11-24 DIAGNOSIS — Z9889 Other specified postprocedural states: Secondary | ICD-10-CM | POA: Insufficient documentation

## 2019-12-14 NOTE — Progress Notes (Signed)
Patient is a 34 year old female here for follow-up after undergoing bilateral breast reduction with inferior pedicle on 11/02/2019 with Dr. Claudia Desanctis.  ~ 6 weeks PO.  Overall she reports she is doing very well.  She reports some occasional mild swelling on the lateral side of her right breast.  She is right-handed.  She has a suture knot on the right intersection of the vertical limb and IMF that is poking her.  Suture knot was removed today.  On the left breast at the intersection of the NAC and vertical limb she reports last week she had a small opening with a little bit of drainage.  Today this is currently healed over and doing well.  All other incision points are healing very well, C/D/I.  No signs of infection, redness, drainage, seroma/hematoma.  Patient denies fever, chest pain, shortness of breath, nausea/vomiting.  May continue to use cocoa butter or Vaseline to breasts.  No activity restrictions.  Wear sports bra during the day, but do not need to wear it at night unless desired.  May wear a normal bra on occasion, but avoid underwire if at all possible.  May shower normally.  Follow-up in 4 to 6 weeks.  Call office with any questions/concerns.  The [place] was signed into law in 2016 which includes the topic of electronic health records.  This provides immediate access to information in MyChart.  This includes consultation notes, operative notes, office notes, lab results and pathology reports.  If you have any questions about what you read please let us know at your next visit or call us at the office.  We are right here with you.

## 2019-12-15 ENCOUNTER — Encounter: Payer: Self-pay | Admitting: Plastic Surgery

## 2019-12-15 ENCOUNTER — Other Ambulatory Visit: Payer: Self-pay

## 2019-12-15 ENCOUNTER — Ambulatory Visit (INDEPENDENT_AMBULATORY_CARE_PROVIDER_SITE_OTHER): Payer: BC Managed Care – PPO | Admitting: Plastic Surgery

## 2019-12-15 VITALS — BP 147/87 | HR 108 | Temp 97.8°F | Ht 61.0 in | Wt 240.0 lb

## 2019-12-15 DIAGNOSIS — Z9889 Other specified postprocedural states: Secondary | ICD-10-CM

## 2019-12-21 ENCOUNTER — Ambulatory Visit (INDEPENDENT_AMBULATORY_CARE_PROVIDER_SITE_OTHER): Payer: BC Managed Care – PPO | Admitting: Vascular Surgery

## 2019-12-21 ENCOUNTER — Other Ambulatory Visit: Payer: Self-pay

## 2019-12-21 VITALS — BP 142/86 | HR 96 | Resp 16 | Ht 61.0 in | Wt 245.2 lb

## 2019-12-21 DIAGNOSIS — M79605 Pain in left leg: Secondary | ICD-10-CM

## 2019-12-21 DIAGNOSIS — Z8781 Personal history of (healed) traumatic fracture: Secondary | ICD-10-CM

## 2019-12-21 DIAGNOSIS — M79609 Pain in unspecified limb: Secondary | ICD-10-CM | POA: Insufficient documentation

## 2019-12-21 DIAGNOSIS — M7989 Other specified soft tissue disorders: Secondary | ICD-10-CM

## 2019-12-21 NOTE — Patient Instructions (Signed)

## 2019-12-21 NOTE — Assessment & Plan Note (Signed)

## 2019-12-21 NOTE — Assessment & Plan Note (Signed)
Repair was 17 years ago.  Has had longstanding swelling since then and clearly has developed lymphedema from this traumatic injury.

## 2019-12-21 NOTE — Assessment & Plan Note (Signed)
Likely multifactorial with her previous injury, but her large amount of swelling is certainly part of the issue.

## 2019-12-21 NOTE — Progress Notes (Signed)
Patient ID: Nancy Owen, female   DOB: [birth year], 34 y.o.   MRN: [id number]  Chief Complaint  Patient presents with  . New Patient (Initial Visit)    ref Nancy Owen lle swelling    HPI Nancy Owen is a 34 y.o. female.  I am asked to see the patient by Dr. Harlow Owen for evaluation of left leg swelling.  The patient reports a major traumatic injury to her left leg in 2004 when she had to have major orthopedic surgery with a large amount of hardware in the left lower leg.  She says her leg has really been swollen since that time.  She was told the swelling would get better but it never did.  It has just steadily progressed over many years.  She says she actually lost a fair amount of weight after her gastric sleeve but her left leg swelling did not improve.  She has a trace amount of swelling in the right leg but no significant issues on the right.  The leg is painful.  She denies any open wounds or ulceration.  No chest pain or shortness of breath.  No fever or chills.  She had some plain x-rays performed of her knee and her ankle which showed some calcification in the soft tissue.  There were no obvious bony abnormalities and her hardware was in place.  I have independently reviewed these studies.  She does not have a previous history of DVT to her knowledge.     Past Medical History:  Diagnosis Date  . Allergic rhinitis   . Encounter for insertion of mirena IUD   . GERD (gastroesophageal reflux disease)   . Gonorrhea   . Herpes genitalia   . High-risk pregnancy 01/17/2014   chorio/mild utering atony  . History of hiatal hernia   . History of syphilis   . Obesity   . Sleep apnea   . Syphilis     Past Surgical History:  Procedure Laterality Date  . BREAST REDUCTION SURGERY Bilateral 11/02/2019   Procedure: MAMMARY REDUCTION  (BREAST);  Surgeon: Cindra Presume, MD;  Location: [place];  Service: Plastics;  Laterality: Bilateral;  3 hours  . FRACTURE SURGERY   2004   L left tibia  . LAPAROSCOPIC GASTRIC SLEEVE RESECTION N/A 07/25/2015   Procedure: LAPAROSCOPIC GASTRIC SLEEVE RESECTION;  Surgeon: Excell Seltzer, MD;  Location: [facility];  Service: General;  Laterality: N/A;     Family History  Problem Relation Age of Onset  . Hypertension Father   . Hypertension Maternal Grandmother   . Diabetes Maternal Grandfather   . Hypertension Maternal Grandfather   . Other Cousin        breast augmentation 2/2 to pain     Social History   Tobacco Use  . Smoking status: Never Smoker  . Smokeless tobacco: Never Used  Substance Use Topics  . Alcohol use: Yes    Alcohol/week: 0.0 standard drinks    Comment: not much now, just at holidays wine cooler  . Drug use: No    No Known Allergies  Current Outpatient Medications  Medication Sig Dispense Refill  . calcium citrate-vitamin D (CITRACAL+D) 315-200 MG-UNIT tablet Take 1 tablet by mouth daily.    . Cyanocobalamin (VITAMIN B-12) 1000 MCG/15ML LIQD Take 15 mLs by mouth daily.    Marland Kitchen etonogestrel-ethinyl estradiol (NUVARING) 0.12-0.015 MG/24HR vaginal ring Insert vaginally and leave in place for 3 consecutive weeks, then remove for 1 week. 1 each 11  .  Multiple Vitamins-Minerals (HAIR SKIN NAILS PO) Take by mouth.    Marland Kitchen OVER THE COUNTER MEDICATION Take 2 tablets by mouth at bedtime.    . Pediatric Multiple Vitamins (FLINTSTONES MULTIVITAMIN PO) Take 1 tablet by mouth daily.    . valACYclovir (VALTREX) 500 MG tablet Take 500 mg by mouth daily. As needed     No current facility-administered medications for this visit.      REVIEW OF SYSTEMS (Negative unless checked)  Constitutional: [] Weight loss  [] Fever  [] Chills Cardiac: [] Chest pain   [] Chest pressure   [] Palpitations   [] Shortness of breath when laying flat   [] Shortness of breath at rest   [] Shortness of breath with exertion. Vascular:  [] Pain in legs with walking   [] Pain in legs at rest   [] Pain in legs when laying flat   [] Claudication    [] Pain in feet when walking  [] Pain in feet at rest  [] Pain in feet when laying flat   [] History of DVT   [] Phlebitis   [x] Swelling in legs   [] Varicose veins   [] Non-healing ulcers Pulmonary:   [] Uses home oxygen   [] Productive cough   [] Hemoptysis   [] Wheeze  [] COPD   [] Asthma Neurologic:  [] Dizziness  [] Blackouts   [] Seizures   [] History of stroke   [] History of TIA  [] Aphasia   [] Temporary blindness   [] Dysphagia   [] Weakness or numbness in arms   [] Weakness or numbness in legs Musculoskeletal:  [x] Arthritis   [] Joint swelling   [x] Joint pain   [] Low back pain Hematologic:  [] Easy bruising  [] Easy bleeding   [] Hypercoagulable state   [] Anemic  [] Hepatitis Gastrointestinal:  [] Blood in stool   [] Vomiting blood  [x] Gastroesophageal reflux/heartburn   [] Abdominal pain Genitourinary:  [] Chronic kidney disease   [] Difficult urination  [] Frequent urination  [] Burning with urination   [] Hematuria Skin:  [] Rashes   [] Ulcers   [] Wounds Psychological:  [] History of anxiety   []  History of major depression.    Physical Exam BP (!) 142/86 (BP Location: Right Arm)   Pulse 96   Resp 16   Ht 5\' 1"  (1.549 m)   Wt 245 lb 3.2 oz (111.2 kg)   LMP 11/21/2019 (Exact Date)   BMI 46.33 kg/m  Gen:  WD/WN, NAD Head: [place]/AT, No temporalis wasting.  Ear/Nose/Throat: Hearing grossly intact, nares w/o erythema or drainage, oropharynx w/o Erythema/Exudate Eyes: Conjunctiva clear, sclera non-icteric  Neck: trachea midline.  No JVD.  Pulmonary:  Good air movement, respirations not labored, no use of accessory muscles  Cardiac: RRR, no JVD Vascular:  Vessel Right Left  Radial Palpable Palpable                          DP  2+  1+  PT  2+  not palpable   Gastrointestinal:. No masses, surgical incisions, or scars. Musculoskeletal: M/S 5/5 throughout.  Extremities without ischemic changes.  No deformity or atrophy.  Trace right lower extremity edema, 3+ left lower extremity edema.  Moderate stasis dermatitis  changes present on the left leg Neurologic: Sensation grossly intact in extremities.  Symmetrical.  Speech is fluent. Motor exam as listed above. Psychiatric: Judgment intact, Mood & affect appropriate for pt's clinical situation. Dermatologic: No rashes or ulcers noted.  No cellulitis or open wounds.    Radiology No results found.  Labs Recent Results (from the past 2160 hour(s))  SARS CORONAVIRUS 2 (TAT 6-24 HRS) Nasopharyngeal Nasopharyngeal Swab     Status: None  Collection Time: 10/29/19  9:48 AM   Specimen: Nasopharyngeal Swab  Result Value Ref Range   SARS Coronavirus 2 NEGATIVE NEGATIVE    Comment: (NOTE) SARS-CoV-2 target nucleic acids are NOT DETECTED. The SARS-CoV-2 RNA is generally detectable in upper and lower respiratory specimens during the acute phase of infection. Negative results do not preclude SARS-CoV-2 infection, do not rule out co-infections with other pathogens, and should not be used as the sole basis for treatment or other patient management decisions. Negative results must be combined with clinical observations, patient history, and epidemiological information. The expected result is Negative. Fact Sheet for Patients: SugarRoll.be Fact Sheet for Healthcare Providers: https://www.woods-mathews.com/ This test is not yet approved or cleared by the [place] FDA and  has been authorized for detection and/or diagnosis of SARS-CoV-2 by FDA under an Emergency Use Authorization (EUA). This EUA will remain  in effect (meaning this test can be used) for the duration of the COVID-19 declaration under Section 56 4(b)(1) of the Act, 21 U.S.C. section 360bbb-3(b)(1), unless the authorization is terminated or revoked sooner. Performed at [facility], [place] [address], [place], [place] [address]   Pregnancy, urine POC     Status: None   Collection Time: 11/02/19 10:20 AM  Result Value Ref Range   Preg Test, Ur  NEGATIVE NEGATIVE    Comment:        THE SENSITIVITY OF THIS METHODOLOGY IS >24 mIU/mL   Surgical pathology     Status: None   Collection Time: 11/02/19 10:42 AM  Result Value Ref Range   SURGICAL PATHOLOGY      SURGICAL PATHOLOGY CASE: [id number] PATIENT: Jnyah Mancinas Surgical Pathology Report     Clinical History: macromastia (cm)     FINAL MICROSCOPIC DIAGNOSIS:  A. BREAST, LEFT, MAMMOPLASTY: - Benign breast parenchyma with no specific histopathologic changes. - Negative for carcinoma  B. BREAST, RIGHT, MAMMOPLASTY: - Benign breast parenchyma with no specific histopathologic changes. - Negative for carcinoma     GROSS DESCRIPTION:  Specimen A: Left mammary tissue, in formalin at 1500 hours Weight: 1203 g Size in aggregate: 23 x 14 x 6 cm Skin: There is dark brown unremarkable skin Cut Surface: Predominantly soft fatty tissue with a small amount of pink-white soft fibrous tissue.  A discrete lesion or mass is not identified. Block Summary: Representative sections in 2 blocks.  Specimen B: Right mammary tissue, in formalin at 1500 hrs. Weight: 1095 g Size in aggregate: 19 x 15 x 6.5 cm Skin: There is dark brown unremarkable skin Cut Surface: Pr edominantly soft fatty tissue with a small amount of pink-white soft fibrous tissue.  A discrete lesion or mass is not identified. Block Summary: Representative sections in 2 blocks.  SW 11/03/2019      Final Diagnosis performed by Jaquita Folds, MD.   Electronically signed 11/04/2019 Technical component performed at [facility]. [facility], [place] [address] [place], [place] [address].  Professional component performed at [facility], [place] [address] [place], [place] [address].  Immunohistochemistry Technical component (if applicable) was performed at [facility]. [address] [place], [place], [place] [address].   IMMUNOHISTOCHEMISTRY DISCLAIMER (if  applicable): Some of these immunohistochemical stains may have been developed and the performance characteristics determine by [facility]. Some may not have been cleared or approved by the U.S. Food and Drug Administration. The FDA has determined that such  clearance or approval is not necessary. This test is used for clinical  purposes. It should not be regarded as investigational or for research. This laboratory is certified under the [place] (CLIA-88) as qualified to perform high complexity clinical laboratory testing.  The controls stained appropriately.     Assessment/Plan:  Hx of fracture of tibia Repair was 17 years ago.  Has had longstanding swelling since then and clearly has developed lymphedema from this traumatic injury.  Pain in limb Likely multifactorial with her previous injury, but her large amount of swelling is certainly part of the issue.  Swelling of limb I have had a long discussion with the patient regarding swelling and why it  causes symptoms.  Patient will begin wearing graduated compression stockings class 1 (20-30 mmHg) on a daily basis a prescription was given. The patient will  beginning wearing the stockings first thing in the morning and removing them in the evening. The patient is instructed specifically not to sleep in the stockings.   In addition, behavioral modification will be initiated.  This will include frequent elevation, use of over the counter pain medications and exercise such as walking.  I have reviewed systemic causes for chronic edema such as liver, kidney and cardiac etiologies.  The patient denies problems with these organ systems.    Consideration for a lymph pump will also be made based upon the effectiveness of conservative therapy.  This would help to improve the edema control and prevent sequela such as ulcers and infections   Patient should undergo duplex ultrasound of the  venous system to ensure that DVT or reflux is not present.  The patient will follow-up with me after the ultrasound.        Leotis Pain 12/21/2019, 2:06 PM   This note was created with Dragon medical transcription system.  Any errors from dictation are unintentional.

## 2020-01-07 ENCOUNTER — Other Ambulatory Visit: Payer: Self-pay

## 2020-01-07 ENCOUNTER — Ambulatory Visit (INDEPENDENT_AMBULATORY_CARE_PROVIDER_SITE_OTHER): Payer: BC Managed Care – PPO | Admitting: Obstetrics and Gynecology

## 2020-01-07 ENCOUNTER — Encounter: Payer: Self-pay | Admitting: Obstetrics and Gynecology

## 2020-01-07 ENCOUNTER — Ambulatory Visit: Payer: 59 | Admitting: Obstetrics and Gynecology

## 2020-01-07 VITALS — Ht 61.0 in | Wt 241.0 lb

## 2020-01-07 DIAGNOSIS — Z6841 Body Mass Index (BMI) 40.0 and over, adult: Secondary | ICD-10-CM | POA: Diagnosis not present

## 2020-01-07 MED ORDER — SAXENDA 18 MG/3ML ~~LOC~~ SOPN: 3.0000 mg | PEN_INJECTOR | Freq: Every day | SUBCUTANEOUS | 2 refills | Status: DC

## 2020-01-07 MED ORDER — NOVOFINE 32G X 6 MM MISC: 3 refills | Status: DC

## 2020-01-07 NOTE — Progress Notes (Signed)
I connected with Nancy Owen on 01/11/20 at 11:30 AM EDT by telephone and verified that I am speaking with the correct person using two identifiers.   I discussed the limitations, risks, security and privacy concerns of performing an evaluation and management service by telephone and the availability of in person appointments. I also discussed with the patient that there may be a patient responsible charge related to this service. The patient expressed understanding and agreed to proceed.  The patient was at home I spoke with the patient from my workstation phone The names of people involved in this encounter were: Nancy Owen , and [name]   Gynecology Office Visit  Chief Complaint:  Chief Complaint  Patient presents with  . Weight Check    History of Present Illness: Patientis a 34 y.o. G22P1011 female, who presents for the evaluation of the desire to lose weight. She maintained weight since her reduction mammoplasty and is wanting to restart phentermine. She tolerated prior phentermine course well.  Reported no other ill effects. The patient specifically denied heart palpitations, anxiety, and insomnia.    Review of Systems: 10 point review of systems negative unless otherwise noted in HPI  Past Medical History:  Past Medical History:  Diagnosis Date  . Allergic rhinitis   . Encounter for insertion of mirena IUD   . GERD (gastroesophageal reflux disease)   . Gonorrhea   . Herpes genitalia   . High-risk pregnancy 01/17/2014   chorio/mild utering atony  . History of hiatal hernia   . History of syphilis   . Obesity   . Sleep apnea   . Syphilis     Past Surgical History:  Past Surgical History:  Procedure Laterality Date  . BREAST REDUCTION SURGERY Bilateral 11/02/2019   Procedure: MAMMARY REDUCTION  (BREAST);  Surgeon: Cindra Presume, MD;  Location: [facility];  Service: Plastics;  Laterality: Bilateral;  3 hours  . FRACTURE SURGERY   2004   L left tibia  . LAPAROSCOPIC GASTRIC SLEEVE RESECTION N/A 07/25/2015   Procedure: LAPAROSCOPIC GASTRIC SLEEVE RESECTION;  Surgeon: Excell Seltzer, MD;  Location: [facility];  Service: General;  Laterality: N/A;    Gynecologic History: Patient's last menstrual period was 12/30/2019.  Obstetric History: G2P1011  Family History:  Family History  Problem Relation Age of Onset  . Hypertension Father   . Hypertension Maternal Grandmother   . Diabetes Maternal Grandfather   . Hypertension Maternal Grandfather   . Other Cousin        breast augmentation 2/2 to pain    Social History:  Social History   Socioeconomic History  . Marital status: Married    Spouse name: Kevon  . Number of children: 1  . Years of education: college  . Highest education level: Not on file  Occupational History  . Occupation: AR specialist- Sport and exercise psychologist: [place]: [place] Use  . Smoking status: Never Smoker  . Smokeless tobacco: Never Used  Vaping Use  . Vaping Use: Never used  Substance and Sexual Activity  . Alcohol use: Yes    Alcohol/week: 0.0 standard drinks    Comment: not much now, just at holidays wine cooler  . Drug use: No  . Sexual activity: Yes    Birth control/protection: Inserts  Other Topics Concern  . Not on file  Social History Narrative   09/09/19   From: [place] - her mom moved her after [place] finished college  Living: with husband [name] and daughter [name]   Work: Medical illustrator - Insurance claims handler      Family: mom nearby      Enjoys: reading, spend time with daughter - swimming, dancing      Exercise: not currently, planning to start exercise plan with sister   Diet: intermittent fasting       Safety   Seat belts: Yes    Guns: No   Safe in relationships: Yes    Social Determinants of Radio broadcast assistant Strain:   . Difficulty of Paying Living Expenses:   Food Insecurity:   . Worried About Charity fundraiser in  the Last Year:   . Arboriculturist in the Last Year:   Transportation Needs:   . Film/video editor (Medical):   Marland Kitchen Lack of Transportation (Non-Medical):   Physical Activity:   . Days of Exercise per Week:   . Minutes of Exercise per Session:   Stress:   . Feeling of Stress :   Social Connections:   . Frequency of Communication with Friends and Family:   . Frequency of Social Gatherings with Friends and Family:   . Attends Religious Services:   . Active Member of Clubs or Organizations:   . Attends Archivist Meetings:   Marland Kitchen Marital Status:   Intimate Partner Violence:   . Fear of Current or Ex-Partner:   . Emotionally Abused:   Marland Kitchen Physically Abused:   . Sexually Abused:     Allergies:  No Known Allergies  Medications: Prior to Admission medications   Medication Sig Start Date End Date Taking? Authorizing Provider  calcium citrate-vitamin D (CITRACAL+D) 315-200 MG-UNIT tablet Take 1 tablet by mouth daily.   Yes [provider]  Cyanocobalamin (VITAMIN B-12) 1000 MCG/15ML LIQD Take 15 mLs by mouth daily.   Yes [provider]  etonogestrel-ethinyl estradiol (NUVARING) 0.12-0.015 MG/24HR vaginal ring Insert vaginally and leave in place for 3 consecutive weeks, then remove for 1 week. 05/19/19  Yes Dalia Heading, CNM  Multiple Vitamins-Minerals (HAIR SKIN NAILS PO) Take by mouth.   Yes Joline Salt, RN  OVER THE COUNTER MEDICATION Take 2 tablets by mouth at bedtime.   Yes [provider]  Pediatric Multiple Vitamins (FLINTSTONES MULTIVITAMIN PO) Take 1 tablet by mouth daily.   Yes [provider]  valACYclovir (VALTREX) 500 MG tablet Take 500 mg by mouth daily. As needed   Yes [provider]    Physical Exam Height 5\' 1"  (1.549 m), weight 241 lb (109.3 kg), last menstrual period 12/30/2019. Wt Readings from Last 3 Encounters:  01/07/20 241 lb (109.3 kg)  12/21/19 245 lb 3.2 oz (111.2 kg)  12/15/19 240 lb (108.9  kg)  Body mass index is 45.54 kg/m.  No physical exam as this was a remote telephone visit to promote social distancing during the current COVID-19 Pandemic   Assessment: 34 y.o. [id number] No problem-specific Assessment & Plan notes found for this encounter.   Plan: Problem List Items Addressed This Visit      Other   Obesity - Primary   Relevant Medications   Liraglutide -Weight Management (SAXENDA) 18 MG/3ML SOPN      1) 1500 Calorie ADA Diet  2) Patient education given regarding appropriate lifestyle changes for weight loss including: regular physical activity, healthy coping strategies, caloric restriction and healthy eating patterns.  3) Patient will be started on weight loss medication. The risks and benefits and side effects  of medication, such as Adipex (Phenteramine) ,  Tenuate (Diethylproprion), Belviq (lorcarsin), Contrave (buproprion/naltrexone), Qsymia (phentermine/topiramate), and Saxenda (liraglutide) is discussed. The pros and cons of suppressing appetite and boosting metabolism is discussed. Risks of tolerence and addiction is discussed for selected agents discussed. Use of medicine will ne short term, such as 3-4 months at a time followed by a period of time off of the medicine to avoid these risks and side effects for Adipex, Qsymia, and Tenuate discussed. Pt to call with any negative side effects and agrees to keep follow up appts.  4) Patient to take medication, with the benefits of appetite suppression and metabolism boost d/w pt, along with the side effects and risk factors of long term use that will be avoided with our use of short bursts of therapy. Rx provided for Saxenda   5) Telephone time 8:60min  6) Return in about 3 months (around 04/08/2020) for medication follow up.    [name], MD, [facility], [facility] 01/07/2020, 11:41 AM

## 2020-01-10 ENCOUNTER — Ambulatory Visit: Payer: 59 | Admitting: Obstetrics and Gynecology

## 2020-01-24 NOTE — Progress Notes (Signed)
Patient is a 34 year old female here for follow-up after undergoing bilateral breast reduction with inferior pedicle on 11/02/2019 with Dr. Claudia Desanctis.  ~ 12 weeks PO Patient reports she is doing very well.  Her incisions have healed very nicely, C/D/I.  No signs of infection, drainage, firmness, seroma/hematoma.  Denies fever, nausea/vomiting, chest pain, shortness of breath. She feels the area around the left lateral portion of the incision is slightly fuller than on the right. No suspicion of seroma. Area is soft without any areas of firmness or fluid on palpation.  She reports some itching of the right breast.  There is a slight irritation redness to this area.  Patient reports she had been on vacation and used a new laundry detergent recently.  Recommend applying some hydrocortisone cream to the area of redness.    Follow up in 2-3 months with Dr. Claudia Desanctis if she feels area around left lateral incision is still fuller than the right.   Pictures were obtained of the patient and placed in the chart with the patient's or guardian's permission.  The [place] was signed into law in 2016 which includes the topic of electronic health records.  This provides immediate access to information in MyChart.  This includes consultation notes, operative notes, office notes, lab results and pathology reports.  If you have any questions about what you read please let us know at your next visit or call us at the office.  We are right here with you.

## 2020-01-25 ENCOUNTER — Encounter: Payer: Self-pay | Admitting: Plastic Surgery

## 2020-01-25 ENCOUNTER — Encounter (INDEPENDENT_AMBULATORY_CARE_PROVIDER_SITE_OTHER): Payer: Self-pay | Admitting: Vascular Surgery

## 2020-01-25 ENCOUNTER — Ambulatory Visit (INDEPENDENT_AMBULATORY_CARE_PROVIDER_SITE_OTHER): Payer: Managed Care, Other (non HMO) | Admitting: Plastic Surgery

## 2020-01-25 ENCOUNTER — Ambulatory Visit (INDEPENDENT_AMBULATORY_CARE_PROVIDER_SITE_OTHER): Payer: BC Managed Care – PPO

## 2020-01-25 ENCOUNTER — Other Ambulatory Visit: Payer: Self-pay

## 2020-01-25 ENCOUNTER — Ambulatory Visit (INDEPENDENT_AMBULATORY_CARE_PROVIDER_SITE_OTHER): Payer: BC Managed Care – PPO | Admitting: Vascular Surgery

## 2020-01-25 VITALS — BP 129/83 | HR 92 | Ht 61.0 in | Wt 249.0 lb

## 2020-01-25 VITALS — BP 136/80 | HR 100 | Temp 97.8°F

## 2020-01-25 DIAGNOSIS — I890 Lymphedema, not elsewhere classified: Secondary | ICD-10-CM

## 2020-01-25 DIAGNOSIS — M7989 Other specified soft tissue disorders: Secondary | ICD-10-CM

## 2020-01-25 DIAGNOSIS — Z8781 Personal history of (healed) traumatic fracture: Secondary | ICD-10-CM

## 2020-01-25 DIAGNOSIS — M79605 Pain in left leg: Secondary | ICD-10-CM

## 2020-01-25 DIAGNOSIS — Z9889 Other specified postprocedural states: Secondary | ICD-10-CM

## 2020-01-25 NOTE — Patient Instructions (Signed)

## 2020-01-25 NOTE — Assessment & Plan Note (Signed)
Minimally better with compression stockings and leg elevation.  Venous study was unrevealing.

## 2020-01-25 NOTE — Assessment & Plan Note (Signed)
Her duplex today shows some venous reflux only in the common femoral vein locally.  No DVT or superficial thrombophlebitis.  No significant venous reflux is seen in the superficial venous system. 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  She has swelling refractory to compression and elevation with skin changes.  No significant venous disease requiring treatment.  In addition to compression stockings and elevation, at this point I would recommend a lymphedema pump as an excellent adjuvant therapy for her leg swelling.  We discussed the pathophysiology and natural history of lymphedema.  We will see the patient back in 4 to 6 months and follow-up to see her response to the lymphedema pump and conservative therapy.
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
## 2020-01-26 ENCOUNTER — Telehealth: Payer: Self-pay

## 2020-01-26 NOTE — Telephone Encounter (Signed)
Pt calling for J code for saxenda to see if needs to get PA c insurance.  [place]

## 2020-01-27 NOTE — Telephone Encounter (Signed)
I spoke to pt, no J code needed for Saxenda d/t it is not an office procedure. Dx codes were given

## 2020-02-02 ENCOUNTER — Encounter: Payer: Self-pay | Admitting: Emergency Medicine

## 2020-02-02 ENCOUNTER — Ambulatory Visit: Payer: Self-pay

## 2020-02-02 ENCOUNTER — Other Ambulatory Visit: Payer: Self-pay

## 2020-02-02 ENCOUNTER — Ambulatory Visit
Admission: EM | Admit: 2020-02-02 | Discharge: 2020-02-02 | Disposition: A | Discharge status: Home / Self Care | Payer: Managed Care, Other (non HMO) | Attending: Family Medicine | Admitting: Family Medicine

## 2020-02-02 DIAGNOSIS — R519 Headache, unspecified: Secondary | ICD-10-CM

## 2020-02-02 DIAGNOSIS — S46819A Strain of other muscles, fascia and tendons at shoulder and upper arm level, unspecified arm, initial encounter: Secondary | ICD-10-CM

## 2020-02-02 NOTE — Discharge Instructions (Signed)
Rest. Drink plenty of fluids. Monitor.   Follow up with your primary care physician this week, or concussion clinic. See above. Close follow up is important. Follow up this week.    Return to Urgent care as needed. For worsening headache, worsening concerns proceed directly to emergency room.
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
## 2020-02-16 ENCOUNTER — Ambulatory Visit: Payer: Managed Care, Other (non HMO) | Admitting: Family Medicine

## 2020-02-16 NOTE — Progress Notes (Signed)
Erroneous encounter

## 2020-02-21 ENCOUNTER — Encounter: Payer: Self-pay | Admitting: Family Medicine

## 2020-02-21 ENCOUNTER — Ambulatory Visit (INDEPENDENT_AMBULATORY_CARE_PROVIDER_SITE_OTHER): Payer: BC Managed Care – PPO | Admitting: Family Medicine

## 2020-02-21 ENCOUNTER — Other Ambulatory Visit: Payer: Self-pay

## 2020-02-21 VITALS — BP 124/82 | HR 97 | Ht 61.0 in | Wt 249.2 lb

## 2020-02-21 DIAGNOSIS — M542 Cervicalgia: Secondary | ICD-10-CM

## 2020-02-21 DIAGNOSIS — S060X0A Concussion without loss of consciousness, initial encounter: Secondary | ICD-10-CM | POA: Diagnosis not present

## 2020-02-21 MED ORDER — NORTRIPTYLINE HCL 25 MG PO CAPS: 25.0000 mg | ORAL_CAPSULE | Freq: Every day | ORAL | 2 refills | Status: DC
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
## 2020-02-21 NOTE — Patient Instructions (Addendum)
Thank you for coming in today.  Take nortryptline at bedtime.  Recheck in about 2 weeks.  Let me know sooner if this is not working.  I do think heat is good for the sore neck.  Also try a TENS unit.    Concussion, Adult  A concussion is a brain injury from a hard, direct hit (trauma) to your head or body. This direct hit causes the brain to quickly shake back and forth inside the skull. A concussion may also be called a mild traumatic brain injury (TBI). Healing from this injury can take time. What are the causes? This condition is caused by:  A direct hit to your head, such as: ? Running into a player during a game. ? Being hit in a fight. ? Hitting your head on a hard surface.  A quick and sudden movement (jolt) of the head or neck, such as in a car crash. What are the signs or symptoms? The signs of a concussion can be hard to notice. They may be missed by you, family members, and doctors. You may look fine on the outside but may not act or feel normal. Physical symptoms  Headaches.  Being tired (fatigued).  Being dizzy.  Problems with body balance.  Problems seeing or hearing.  Being sensitive to light or noise.  Feeling sick to your stomach (nausea) or throwing up (vomiting).  Not sleeping or eating as you used to.  Loss of feeling (numbness) or tingling in the body.  Seizure. Mental and emotional symptoms  Problems remembering things.  Trouble focusing your mind (concentrating), organizing, or making decisions.  Being slow to think, act, react, speak, or read.  Feeling grouchy (irritable).  Having mood changes.  Feeling worried or nervous (anxious).  Feeling sad (depressed). How is this treated? This condition may be treated by:  Stopping sports or activity if you are injured. If you hit your head or have signs of concussion: ? Do not return to sports or activities the same day. ? Get checked by a doctor before you return to your  activities.  Resting your body and your mind.  Being watched carefully, often at home.  Medicines to help with symptoms such as: ? Feeling sick to your stomach. ? Headaches. ? Problems with sleep.  Avoid taking strong pain medicines (opioids) for a concussion.  Avoiding alcohol and drugs.  Being asked to go to a concussion clinic or a place to help you recover (rehabilitation center). Recovery from a concussion can take time. Return to activities only:  When you are fully healed.  When your doctor says it is safe. Follow these instructions at home: Activity  Limit activities that need a lot of thought or focus, such as: ? Homework or work for your job. ? Watching TV. ? Using the computer or phone. ? Playing memory games and puzzles.  Rest. Rest helps your brain heal. Make sure you: ? Get plenty of sleep. Most adults should get 7-9 hours of sleep each night. ? Rest during the day. Take naps or breaks when you feel tired.  Avoid activity like exercise until your doctor says its safe. Stop any activity that makes symptoms worse.  Do not do activities that could cause a second concussion, such as riding a bike or playing sports.  Ask your doctor when you can return to your normal activities, such as school, work, sports, and driving. Your ability to react may be slower. Do not do these activities if you are  dizzy. General instructions   Take over-the-counter and prescription medicines only as told by your doctor.  Do not drink alcohol until your doctor says you can.  Watch your symptoms and tell other people to do the same. Other problems can occur after a concussion. Older adults have a higher risk of serious problems.  Tell your work Freight forwarder, teachers, Government social research officer, school counselor, coach, or Product/process development scientist about your injury and symptoms. Tell them about what you can or cannot do.  Keep all follow-up visits as told by your doctor. This is important. How is this  prevented?  It is very important that you do not get another brain injury. In rare cases, another injury can cause brain damage that will not go away, brain swelling, or death. The risk of this is greatest in the first 7-10 days after a head injury. To avoid injuries: ? Stop activities that could lead to a second concussion, such as contact sports, until your doctor says it is okay. ? When you return to sports or activities:  Do not crash into other players. This is how most concussions happen.  Follow the rules.  Respect other players. ? Get regular exercise. Do strength and balance training. ? Wear a helmet that fits you well during sports, biking, or other activities.  Helmets can help protect you from serious skull and brain injuries, but they do not protect you from a concussion. Even when wearing a helmet, you should avoid being hit in the head. Contact a doctor if:  Your symptoms get worse or they do not get better.  You have new symptoms.  You have another injury. Get help right away if:  You have bad headaches or your headaches get worse.  You feel weak or numb in any part of your body.  You are mixed up (confused).  Your balance gets worse.  You keep throwing up.  You feel more sleepy than normal.  Your speech is not clear (is slurred).  You cannot recognize people or places.  You have a seizure.  Others have trouble waking you up.  You have behavior changes.  You have changes in how you see (vision).  You pass out (lose consciousness). Summary  A concussion is a brain injury from a hard, direct hit (trauma) to your head or body.  This condition is treated with rest and careful watching of symptoms.  If you keep having symptoms, call your doctor. This information is not intended to replace advice given to you by your health care provider. Make sure you discuss any questions you have with your health care provider. Document Revised: 02/19/2018  Document Reviewed: 02/19/2018 Elsevier Patient Education  [place].

## 2020-02-29 ENCOUNTER — Telehealth: Payer: Self-pay | Admitting: Family Medicine

## 2020-02-29 NOTE — Telephone Encounter (Signed)
Pt was advised to call after trying the medication. She feels no better, still has trouble sleeping, is waking with HA and has frequent HA throughout the day. She has tried doubling the meds and did not notice a difference.

## 2020-03-01 MED ORDER — AMITRIPTYLINE HCL 25 MG PO TABS: 25.0000 mg | ORAL_TABLET | Freq: Every day | ORAL | 1 refills | Status: DC

## 2020-03-01 NOTE — Telephone Encounter (Signed)
Stop nortriptyline.  Start amitriptyline at bedtime.  Take 1 to 2 pills at bedtime.  This is a little more effective than nortriptyline but can make you feel more sleepy.  If this does not work I have another backup plan.

## 2020-03-01 NOTE — Telephone Encounter (Signed)
Called and left detailed message w/ Dr. Clovis Riley advice to stop nortriptyline and begin amitriptyline.

## 2020-03-06 ENCOUNTER — Encounter: Payer: Self-pay | Admitting: Family Medicine

## 2020-03-06 ENCOUNTER — Ambulatory Visit (INDEPENDENT_AMBULATORY_CARE_PROVIDER_SITE_OTHER): Payer: BC Managed Care – PPO | Admitting: Family Medicine

## 2020-03-06 ENCOUNTER — Other Ambulatory Visit: Payer: Self-pay

## 2020-03-06 VITALS — BP 130/80 | HR 118 | Ht 61.0 in | Wt 251.8 lb

## 2020-03-06 DIAGNOSIS — S060X0A Concussion without loss of consciousness, initial encounter: Secondary | ICD-10-CM

## 2020-03-06 DIAGNOSIS — M5481 Occipital neuralgia: Secondary | ICD-10-CM | POA: Diagnosis not present

## 2020-03-06 DIAGNOSIS — G44319 Acute post-traumatic headache, not intractable: Secondary | ICD-10-CM | POA: Diagnosis not present

## 2020-03-06 DIAGNOSIS — M542 Cervicalgia: Secondary | ICD-10-CM | POA: Diagnosis not present

## 2020-03-06 NOTE — Patient Instructions (Addendum)
Thank you for coming in today. Try increasing the amitriptyline to 1.5 or 2 pills at bedtime.  If this is more annoying than helpful we can add trazodone for sleep and or topamax for headache.   I think some of the headache is due to neck spasm causing occipital neuralgia.  Recheck with me in 1 month in person but keep me updated.  We can make changes before you come back.      Occipital Neuralgia  Occipital neuralgia is a type of headache that causes brief episodes of very bad pain in the back of your head. Pain from occipital neuralgia may spread (radiate) to other parts of your head. These headaches may be caused by irritation of the nerves that leave your spinal cord high up in your neck, just below the base of your skull (occipital nerves). Your occipital nerves transmit sensations from the back of your head, the top of your head, and the areas behind your ears. What are the causes? This condition can occur without any known cause (primary headache syndrome). In other cases, this condition is caused by pressure on or irritation of one of the two occipital nerves. Pressure and irritation may be due to:  Muscle spasm in the neck.  Neck injury.  Wear and tear of the vertebrae in the neck (osteoarthritis).  Disease of the disks that separate the vertebrae.  Swollen blood vessels that put pressure on the occipital nerves.  Infections.  Tumors.  Diabetes. What are the signs or symptoms? This condition causes brief burning, stabbing, electric, shocking, or shooting pain which can radiate to the top of the head. It can happen on one side or both sides of the head. It can also cause:  Pain behind the eye.  Pain triggered by neck movement or hair brushing.  Scalp tenderness.  Aching in the back of the head between episodes of very bad pain.  Pain gets worse with exposure to bright lights. How is this diagnosed? There is no test that diagnoses this condition. Your health care  provider may diagnose this condition based on a physical exam and your symptoms. Other tests may be done, such as:  Imaging studies of the brain and neck (cervical spine), such as an MRI or CT scan. These look for causes of pinched nerves.  Applying pressure to the nerves in the neck to try to re-create the pain.  Injection of numbing medicine into the occipital nerve areas to see if pain goes away (diagnostic nerve block). How is this treated? Treatment for this condition may begin with simple measures, such as:  Rest.  Massage.  Applying heat or cold on the area.  Over-the-counter pain relievers. If these measures do not work, you may need other treatments, including:  Medicines, such as: ? Prescription-strength anti-inflammatory medicines. ? Muscle relaxants. ? Anti-seizure medicines, which can relieve pain. ? Antidepressants, which can relieve pain. ? Injected medicines, such as medicines that numb the area (local anesthetic) and steroids.  Pulsed radiofrequency ablation. This is when wires are implanted to deliver electrical impulses that block pain signals from the occipital nerve.  Surgery to relieve nerve pressure.  Physical therapy. Follow these instructions at home: Pain management      Avoid any activities that cause pain.  Rest when you have an attack of pain.  Try gentle massage to relieve pain.  Try a different pillow or sleeping position.  If directed, apply heat to the affected area as told by your health care provider.  Use the heat source that your health care provider recommends, such as a moist heat pack or a heating pad. ? Place a towel between your skin and the heat source. ? Leave the heat on for 20-30 minutes. ? Remove the heat if your skin turns bright red. This is especially important if you are unable to feel pain, heat, or cold. You may have a greater risk of getting burned.  If directed, apply ice to the back of the head and neck area as  told by your health care provider. ? Put ice in a plastic bag. ? Place a towel between your skin and the bag. ? Leave the ice on for 20 minutes, 2-3 times per day. General instructions  Take over-the-counter and prescription medicines only as told by your health care provider.  Avoid things that make your symptoms worse, such as bright lights.  Try to stay active. Get regular exercise that does not cause pain. Ask your health care provider to suggest safe exercises for you.  Work with a physical therapist to learn stretching exercises you can do at home.  Practice good posture.  Keep all follow-up visits as told by your health care provider. This is important. Contact a health care provider if:  Your medicine is not working.  You have new or worsening symptoms. Get help right away if:  You have very bad head pain that does not go away.  You have a sudden change in vision, balance, or speech. Summary  Occipital neuralgia is a type of headache that causes brief episodes of very bad pain in the back of your head.  Pain from occipital neuralgia may spread (radiate) to other parts of your head.  Treatment for this condition includes rest, massage, and medicines. This information is not intended to replace advice given to you by your health care provider. Make sure you discuss any questions you have with your health care provider. Document Revised: 06/17/2017 Document Reviewed: 09/05/2016 Elsevier Patient Education  [place].
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
## 2020-03-11 ENCOUNTER — Other Ambulatory Visit: Payer: Self-pay | Admitting: Family Medicine

## 2020-03-15 ENCOUNTER — Encounter: Payer: Self-pay | Admitting: Physical Therapy

## 2020-03-15 ENCOUNTER — Ambulatory Visit: Payer: BC Managed Care – PPO | Attending: Family Medicine | Admitting: Physical Therapy

## 2020-03-15 ENCOUNTER — Other Ambulatory Visit: Payer: Self-pay

## 2020-03-15 DIAGNOSIS — R29898 Other symptoms and signs involving the musculoskeletal system: Secondary | ICD-10-CM

## 2020-03-15 DIAGNOSIS — G44001 Cluster headache syndrome, unspecified, intractable: Secondary | ICD-10-CM
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
## 2020-03-16 ENCOUNTER — Other Ambulatory Visit: Payer: Self-pay | Admitting: Family Medicine

## 2020-03-24 ENCOUNTER — Ambulatory Visit: Payer: BC Managed Care – PPO

## 2020-03-27 ENCOUNTER — Ambulatory Visit: Payer: BC Managed Care – PPO

## 2020-03-27 ENCOUNTER — Other Ambulatory Visit: Payer: Self-pay

## 2020-03-27 DIAGNOSIS — G44001 Cluster headache syndrome, unspecified, intractable: Secondary | ICD-10-CM | POA: Diagnosis not present

## 2020-03-27 DIAGNOSIS — R29898 Other symptoms and signs involving the musculoskeletal system: Secondary | ICD-10-CM
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
## 2020-03-30 ENCOUNTER — Ambulatory Visit: Payer: BC Managed Care – PPO

## 2020-03-30 ENCOUNTER — Ambulatory Visit (INDEPENDENT_AMBULATORY_CARE_PROVIDER_SITE_OTHER): Payer: BC Managed Care – PPO | Admitting: Plastic Surgery

## 2020-03-30 ENCOUNTER — Encounter: Payer: Self-pay | Admitting: Plastic Surgery

## 2020-03-30 ENCOUNTER — Other Ambulatory Visit: Payer: Self-pay

## 2020-03-30 VITALS — BP 117/85 | HR 131 | Temp 98.3°F

## 2020-03-30 DIAGNOSIS — N62 Hypertrophy of breast: Secondary | ICD-10-CM | POA: Diagnosis not present

## 2020-03-30 NOTE — Progress Notes (Signed)
   Referring Provider [name], MD [place],  [place] [address]   CC:  Chief Complaint  Patient presents with  . Follow-up      Nancy Owen is an 34 y.o. female.  HPI: Patient presents about 5 months out from bilateral breast reduction.  She is overall fairly happy.  She does have some concerns with excess tissue in the left axilla transitioning onto her back.  She also is having some skin irritation along the inferior aspect of the right side.  Otherwise she is happy with her shape size and symmetry.  Review of Systems General: Denies fevers or chills  Physical Exam Vitals with BMI 03/30/2020 03/06/2020 02/21/2020  Height - 5\' 1"  5\' 1"   Weight - 251 lbs 13 oz 249 lbs 3 oz  BMI - 22.4 82.50  Systolic 037 048 889  Diastolic 85 80 82  Pulse 169 118 97    General:  No acute distress,  Alert and oriented, Non-Toxic, Normal speech and affect On examination everything is healed nicely.  Her scars are on obtrusive.  Everything is symmetric with a good size and shape.  She does have a little bit of excess back tissue on the left side transitioning from the axilla onto her back.  This is slightly more than on the right side.  Visually the right breast inferior to the nipple looks normal and fine and I cannot see any issues there externally.  Assessment/Plan In regards to the excess tissue on the left axilla we did discuss liposuctioning of the back extending into that area.  We discussed the risk that include bleeding, infection, damage surrounding structures, and potential for persistent contour irregularities.  We did provide a quote for her for doing this and she is going to think about it.  In regards to the skin irritation on the right side that she feels I recommended experimenting with different bras that she still wearing compressive bras most of the time.  It could be that that is a contact type problem and that different bra might help with it.  I do not see  anything externally that I could do or change.  She like to follow-up with me again in 3 months and we will plan to reevaluate everything at that time.  Cindra Presume 03/30/2020, 10:27 AM

## 2020-03-31 ENCOUNTER — Ambulatory Visit: Payer: Managed Care, Other (non HMO) | Admitting: Plastic Surgery

## 2020-04-03 ENCOUNTER — Ambulatory Visit (INDEPENDENT_AMBULATORY_CARE_PROVIDER_SITE_OTHER): Payer: BC Managed Care – PPO | Admitting: Family Medicine

## 2020-04-03 DIAGNOSIS — M542 Cervicalgia: Secondary | ICD-10-CM | POA: Diagnosis not present

## 2020-04-03 DIAGNOSIS — S060X0D Concussion without loss of consciousness, subsequent encounter: Secondary | ICD-10-CM | POA: Diagnosis not present

## 2020-04-03 DIAGNOSIS — M5481 Occipital neuralgia: Secondary | ICD-10-CM | POA: Diagnosis not present

## 2020-04-03 MED ORDER — AMITRIPTYLINE HCL 25 MG PO TABS: 50.0000 mg | ORAL_TABLET | Freq: Every day | ORAL | 2 refills | Status: DC
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
## 2020-04-03 NOTE — Patient Instructions (Addendum)
Thank you for coming in today. Please recheck in about 1 month.  Increase the amitriptyline to 75 or 100mg  at bedtime as needed.  Continue PT.

## 2020-04-04 ENCOUNTER — Other Ambulatory Visit: Payer: Self-pay

## 2020-04-04 ENCOUNTER — Ambulatory Visit: Payer: BC Managed Care – PPO

## 2020-04-04 DIAGNOSIS — G44001 Cluster headache syndrome, unspecified, intractable: Secondary | ICD-10-CM | POA: Diagnosis not present

## 2020-04-04 DIAGNOSIS — R29898 Other symptoms and signs involving the musculoskeletal system: Secondary | ICD-10-CM

## 2020-04-04 NOTE — Patient Instructions (Addendum)
Access Code: [email or web address] URL: [email or web address] Date: 04/04/2020 Prepared by: Roxana Hires  Exercises Supine Head Nod with Deep Neck Flexor Activation - 2 x daily - 7 x weekly - 2 sets - 10 reps - 5s hold Seated Cervical Retraction - 2 x daily - 7 x weekly - 2 sets - 10 reps - 5s hold Seated Upper Trapezius Stretch - 2 x daily - 7 x weekly - 3 reps - 30s hold
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
## 2020-04-06 ENCOUNTER — Ambulatory Visit: Payer: BC Managed Care – PPO

## 2020-04-10 ENCOUNTER — Ambulatory Visit (INDEPENDENT_AMBULATORY_CARE_PROVIDER_SITE_OTHER): Payer: BC Managed Care – PPO | Admitting: Obstetrics and Gynecology

## 2020-04-10 ENCOUNTER — Other Ambulatory Visit: Payer: Self-pay

## 2020-04-10 ENCOUNTER — Encounter: Payer: Self-pay | Admitting: Obstetrics and Gynecology

## 2020-04-10 VITALS — BP 128/88 | Ht 61.0 in | Wt 258.0 lb

## 2020-04-10 DIAGNOSIS — Z6838 Body mass index (BMI) 38.0-38.9, adult: Secondary | ICD-10-CM | POA: Diagnosis not present

## 2020-04-10 DIAGNOSIS — G4733 Obstructive sleep apnea (adult) (pediatric): Secondary | ICD-10-CM | POA: Diagnosis not present

## 2020-04-10 DIAGNOSIS — E669 Obesity, unspecified: Secondary | ICD-10-CM

## 2020-04-10 MED ORDER — PHENTERMINE HCL 37.5 MG PO TABS: 37.5000 mg | ORAL_TABLET | Freq: Every day | ORAL | 0 refills | Status: DC

## 2020-04-10 NOTE — Progress Notes (Signed)
Gynecology Office Visit  Chief Complaint:  Chief Complaint  Patient presents with  . Weight Check    air bubbles in right ear    History of Present Illness: Patientis a 34 y.o. G41P1011 female, who presents for the evaluation of the desire to lose weight. She has gained 17lbs since her last medical weight loss regimen.  Reports decreased time for physical activity. She is status post prior sleeve gastrectomy 07/25/2015.  Reports initially doing well with weight loss but feels not working as well anymore and interest in possible revision or other surgical options.  In the meantime the patient  Is interested in an other course of phentermine.  She did well with phentermine in the past with no ill effects. The patient denied heart palpitations, anxiety, and insomnia. She does feel her OSA is worse as well and her mask is not fitting as well.   Review of Systems: 10 point review of systems negative unless otherwise noted in HPI  Past Medical History:  Past Medical History:  Diagnosis Date  . Allergic rhinitis   . Encounter for insertion of mirena IUD   . GERD (gastroesophageal reflux disease)   . Gonorrhea   . Herpes genitalia   . High-risk pregnancy 01/17/2014   chorio/mild utering atony  . History of hiatal hernia   . History of syphilis   . Obesity   . Sleep apnea   . Syphilis     Past Surgical History:  Past Surgical History:  Procedure Laterality Date  . BREAST REDUCTION SURGERY Bilateral 11/02/2019   Procedure: MAMMARY REDUCTION  (BREAST);  Surgeon: Cindra Presume, MD;  Location: [facility];  Service: Plastics;  Laterality: Bilateral;  3 hours  . FRACTURE SURGERY  2004   L left tibia  . LAPAROSCOPIC GASTRIC SLEEVE RESECTION N/A 07/25/2015   Procedure: LAPAROSCOPIC GASTRIC SLEEVE RESECTION;  Surgeon: Excell Seltzer, MD;  [facility];  Service: General;  Laterality: N/A;    Gynecologic History: Patient's last menstrual period was 03/21/2020  (exact date).  Obstetric History: G2P1011  Family History:  Family History  Problem Relation Age of Onset  . Hypertension Father   . Hypertension Maternal Grandmother   . Diabetes Maternal Grandfather   . Hypertension Maternal Grandfather   . Other Cousin        breast augmentation 2/2 to pain    Social History:  Social History   Socioeconomic History  . Marital status: Married    Spouse name: Kevon  . Number of children: 1  . Years of education: college  . Highest education level: Not on file  Occupational History  . Occupation: AR specialist- Sport and exercise psychologist: [place]: [place] Use  . Smoking status: Never Smoker  . Smokeless tobacco: Never Used  Vaping Use  . Vaping Use: Never used  Substance and Sexual Activity  . Alcohol use: Yes    Alcohol/week: 0.0 standard drinks    Comment: not much now, just at holidays wine cooler  . Drug use: No  . Sexual activity: Yes    Birth control/protection: Inserts  Other Topics Concern  . Not on file  Social History Narrative   09/09/19   From: [place] - her mom moved her after [place] finished college   Living: with husband [name] and daughter [name]   Work: Medical illustrator - Insurance claims handler      Family: mom nearby      Enjoys: reading,  spend time with daughter - swimming, dancing      Exercise: not currently, planning to start exercise plan with sister   Diet: intermittent fasting       Safety   Seat belts: Yes    Guns: No   Safe in relationships: Yes    Social Determinants of Health   Financial Resource Strain:   . Difficulty of Paying Living Expenses: Not on file  Food Insecurity:   . Worried About Charity fundraiser in the Last Year: Not on file  . Ran Out of Food in the Last Year: Not on file  Transportation Needs:   . Lack of Transportation (Medical): Not on file  . Lack of Transportation (Non-Medical): Not on file  Physical Activity:   . Days of Exercise per Week: Not on  file  . Minutes of Exercise per Session: Not on file  Stress:   . Feeling of Stress : Not on file  Social Connections:   . Frequency of Communication with Friends and Family: Not on file  . Frequency of Social Gatherings with Friends and Family: Not on file  . Attends Religious Services: Not on file  . Active Member of Clubs or Organizations: Not on file  . Attends Archivist Meetings: Not on file  . Marital Status: Not on file  Intimate Partner Violence:   . Fear of Current or Ex-Partner: Not on file  . Emotionally Abused: Not on file  . Physically Abused: Not on file  . Sexually Abused: Not on file    Allergies:  No Known Allergies  Medications: Prior to Admission medications   Medication Sig Start Date End Date Taking? Authorizing Provider  amitriptyline (ELAVIL) 25 MG tablet Take 2-4 tablets (50-100 mg total) by mouth at bedtime. For sleep and headache 04/03/20  Yes Gregor Hams, MD  calcium citrate-vitamin D (CITRACAL+D) 315-200 MG-UNIT tablet Take 1 tablet by mouth daily.   Yes [provider]  Cyanocobalamin (VITAMIN B-12) 1000 MCG/15ML LIQD Take 15 mLs by mouth daily.   Yes [provider]  etonogestrel-ethinyl estradiol (NUVARING) 0.12-0.015 MG/24HR vaginal ring Insert vaginally and leave in place for 3 consecutive weeks, then remove for 1 week. 05/19/19  Yes Dalia Heading, CNM  Multiple Vitamins-Minerals (HAIR SKIN NAILS PO) Take by mouth.   Yes Joline Salt, RN  OVER THE COUNTER MEDICATION Take 2 tablets by mouth at bedtime.   Yes [provider]  Pediatric Multiple Vitamins (FLINTSTONES MULTIVITAMIN PO) Take 1 tablet by mouth daily.   Yes [provider]  valACYclovir (VALTREX) 500 MG tablet Take 500 mg by mouth daily. As needed   Yes [provider]    Physical Exam Blood pressure 128/88, height 5\' 1"  (1.549 m), weight 258 lb (117 kg), last menstrual period 03/21/2020. Wt Readings from Last 3 Encounters:    04/10/20 258 lb (117 kg)  03/06/20 251 lb 12.8 oz (114.2 kg)  02/21/20 249 lb 3.2 oz (113 kg)  Body mass index is 48.75 kg/m.   General: NAD HEENT: normocephalic, anicteric Thyroid: no enlargement Pulmonary: no increased work of breathing Neurologic: Grossly intact Psychiatric: mood appropriate, affect full  Assessment: 34 y.o. G2P1011 medical weight loss visit, OSA  Plan: Problem List Items Addressed This Visit      Respiratory   OSA (obstructive sleep apnea)   Relevant Orders   Ambulatory referral to Sleep Studies     Other   Obesity - Primary   Relevant Medications   phentermine (  ADIPEX-P) 37.5 MG tablet   Other Relevant Orders   Amb Referral to Bariatric Surgery      1) 1500 Calorie ADA Diet  2) Patient education given regarding appropriate lifestyle changes for weight loss including: regular physical activity, healthy coping strategies, caloric restriction and healthy eating patterns.  3) Patient will be started on weight loss medication. The risks and benefits and side effects of medication, such as Adipex (Phenteramine) ,  Tenuate (Diethylproprion), Belviq (lorcarsin), Contrave (buproprion/naltrexone), Qsymia (phentermine/topiramate), and Saxenda (liraglutide) is discussed. The pros and cons of suppressing appetite and boosting metabolism is discussed. Risks of tolerence and addiction is discussed for selected agents discussed. Use of medicine will ne short term, such as 3-4 months at a time followed by a period of time off of the medicine to avoid these risks and side effects for Adipex, Qsymia, and Tenuate discussed. Pt to call with any negative side effects and agrees to keep follow up appts.  4) Patient to take medication, with the benefits of appetite suppression and metabolism boost d/w pt, along with the side effects and risk factors of long term use that will be avoided with our use of short bursts of therapy. Rx provided.    5) 15 minutes face-to-face; with  counseling/coordination of care > 50 percent of visit related to obesity and ongoing management/treatment   6) sleep study, referral [facility] ([facility]), restart phentermine  7) Return in about 4 weeks (around 05/08/2020) for medication follow up.    [name], MD, [facility], [facility] 04/10/2020, 4:01 PM

## 2020-04-11 ENCOUNTER — Ambulatory Visit: Payer: BC Managed Care – PPO

## 2020-04-11 DIAGNOSIS — G44001 Cluster headache syndrome, unspecified, intractable: Secondary | ICD-10-CM
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
## 2020-04-13 ENCOUNTER — Ambulatory Visit: Payer: BC Managed Care – PPO

## 2020-04-17 ENCOUNTER — Ambulatory Visit: Payer: BC Managed Care – PPO | Attending: Family Medicine

## 2020-04-17 DIAGNOSIS — G44001 Cluster headache syndrome, unspecified, intractable: Secondary | ICD-10-CM | POA: Insufficient documentation

## 2020-04-19 ENCOUNTER — Ambulatory Visit: Payer: BC Managed Care – PPO

## 2020-04-24 ENCOUNTER — Ambulatory Visit: Payer: BC Managed Care – PPO

## 2020-04-26 ENCOUNTER — Ambulatory Visit: Payer: No Typology Code available for payment source | Admitting: Plastic Surgery

## 2020-04-26 ENCOUNTER — Ambulatory Visit: Payer: BC Managed Care – PPO

## 2020-04-27 ENCOUNTER — Other Ambulatory Visit: Payer: Self-pay | Admitting: Family Medicine

## 2020-05-01 ENCOUNTER — Ambulatory Visit (INDEPENDENT_AMBULATORY_CARE_PROVIDER_SITE_OTHER): Payer: BC Managed Care – PPO | Admitting: Family Medicine

## 2020-05-01 ENCOUNTER — Other Ambulatory Visit: Payer: Self-pay

## 2020-05-01 DIAGNOSIS — M542 Cervicalgia: Secondary | ICD-10-CM | POA: Diagnosis not present

## 2020-05-01 DIAGNOSIS — S060X0D Concussion without loss of consciousness, subsequent encounter: Secondary | ICD-10-CM

## 2020-05-01 MED ORDER — AMITRIPTYLINE HCL 100 MG PO TABS: 100.0000 mg | ORAL_TABLET | Freq: Every evening | ORAL | 2 refills | Status: DC | PRN

## 2020-05-01 NOTE — Progress Notes (Signed)
Virtual Visit  via Video Note   I connected with Nancy Owen  today by a video enabled telemedicine application and verified that I am speaking with the correct person using two identifiers.  ? Location of the provider office ? Location of the patient home ? The names and roles of all persons participating in the visit. Pt and myself   I discussed the limitations, risks, security and privacy concerns of performing an evaluation and management service by telephone and the availability of in person appointments. I also discussed with the patient that there may be a patient responsible charge related to this service. The patient expressed understanding and agreed to proceed.    I discussed the limitations of evaluation and management by telemedicine and the availability of in person appointments. The patient expressed understanding and agreed to proceed.  History of Present Illness: Nancy Owen is a 34 y.o. female who would like to discuss concussion.  Nancy Owen feels about 90% better compared to her symptom onset.  She is sleeping much better.  She takes 100 mg of amitriptyline at bedtime which works quite well to allow her to sleep and control headache.  She is happy with how things are going.  She has 1 or 2 more physical therapy sessions left but overall is about done.   Observations/Objective: Exam:  Normal Speech.     Assessment and Plan: 34 y.o. female with concussion.  Significant improvement.  Plan to continue amitriptyline at 100 mg at night as needed.  Recheck back with me as needed.  Finish out physical therapy and complete home exercise program.  PDMP not reviewed this encounter. No orders of the defined types were placed in this encounter.  No orders of the defined types were placed in this encounter.   Follow Up Instructions:    I discussed the assessment and treatment plan with the patient. The patient was provided an opportunity to ask questions and  all were answered. The patient agreed with the plan and demonstrated an understanding of the instructions.   The patient was advised to call back or seek an in-person evaluation if the symptoms worsen or if the condition fails to improve as anticipated.   Total encounter time 21 minutes including phone time with the patient and, reviewing past medical record, and charting on the date of service.      Historical information moved to improve visibility of documentation.  Past Medical History:  Diagnosis Date  . Allergic rhinitis   . Encounter for insertion of mirena IUD   . GERD (gastroesophageal reflux disease)   . Gonorrhea   . Herpes genitalia   . High-risk pregnancy 01/17/2014   chorio/mild utering atony  . History of hiatal hernia   . History of syphilis   . Obesity   . Sleep apnea   . Syphilis    Past Surgical History:  Procedure Laterality Date  . BREAST REDUCTION SURGERY Bilateral 11/02/2019   Procedure: MAMMARY REDUCTION  (BREAST);  Surgeon: Cindra Presume, MD;  Location: [place];  Service: Plastics;  Laterality: Bilateral;  3 hours  . FRACTURE SURGERY  2004   L left tibia  . LAPAROSCOPIC GASTRIC SLEEVE RESECTION N/A 07/25/2015   Procedure: LAPAROSCOPIC GASTRIC SLEEVE RESECTION;  Surgeon: Excell Seltzer, MD;  [facility];  Service: General;  Laterality: N/A;   Social History   Tobacco Use  . Smoking status: Never Smoker  . Smokeless tobacco: Never Used  Substance Use Topics  .  Alcohol use: Yes    Alcohol/week: 0.0 standard drinks    Comment: not much now, just at holidays wine cooler   family history includes Diabetes in her maternal grandfather; Hypertension in her father, maternal grandfather, and maternal grandmother; Other in her cousin.  Medications: Current Outpatient Medications  Medication Sig Dispense Refill  . amitriptyline (ELAVIL) 25 MG tablet Take 2-4 tablets (50-100 mg total) by mouth at bedtime. For sleep and headache 120  tablet 2  . calcium citrate-vitamin D (CITRACAL+D) 315-200 MG-UNIT tablet Take 1 tablet by mouth daily.    . Cyanocobalamin (VITAMIN B-12) 1000 MCG/15ML LIQD Take 15 mLs by mouth daily.    Marland Kitchen etonogestrel-ethinyl estradiol (NUVARING) 0.12-0.015 MG/24HR vaginal ring Insert vaginally and leave in place for 3 consecutive weeks, then remove for 1 week. 1 each 11  . Multiple Vitamins-Minerals (HAIR SKIN NAILS PO) Take by mouth.    Marland Kitchen OVER THE COUNTER MEDICATION Take 2 tablets by mouth at bedtime.    . Pediatric Multiple Vitamins (FLINTSTONES MULTIVITAMIN PO) Take 1 tablet by mouth daily.    . phentermine (ADIPEX-P) 37.5 MG tablet Take 1 tablet (37.5 mg total) by mouth daily before breakfast. 30 tablet 0  . valACYclovir (VALTREX) 500 MG tablet Take 500 mg by mouth daily. As needed     No current facility-administered medications for this visit.   No Known Allergies

## 2020-05-09 ENCOUNTER — Other Ambulatory Visit: Payer: Self-pay

## 2020-05-09 ENCOUNTER — Ambulatory Visit (INDEPENDENT_AMBULATORY_CARE_PROVIDER_SITE_OTHER): Payer: BC Managed Care – PPO | Admitting: Obstetrics and Gynecology

## 2020-05-09 ENCOUNTER — Encounter: Payer: Self-pay | Admitting: Obstetrics and Gynecology

## 2020-05-09 VITALS — BP 128/88 | Ht 61.0 in | Wt 257.0 lb

## 2020-05-09 DIAGNOSIS — Z23 Encounter for immunization: Secondary | ICD-10-CM | POA: Diagnosis not present

## 2020-05-09 DIAGNOSIS — Z6841 Body Mass Index (BMI) 40.0 and over, adult: Secondary | ICD-10-CM

## 2020-05-09 MED ORDER — METFORMIN HCL 500 MG PO TABS: ORAL_TABLET | ORAL | 0 refills | Status: DC

## 2020-05-09 MED ORDER — PHENTERMINE HCL 37.5 MG PO TABS
37.5000 mg | ORAL_TABLET | Freq: Every day | ORAL | 0 refills | Status: DC
Start: 2020-05-09 — End: 2020-12-13

## 2020-05-09 NOTE — Progress Notes (Addendum)
Gynecology Office Visit  Chief Complaint:  Chief Complaint  Patient presents with   Weight Check    History of Present Illness: Patientis a 34 y.o. G87P1011 female, who presents for the evaluation of the desire to lose weight. She has lost 1 pounds 1 months. The patient states the following symptoms since starting her weight loss therapy: appetite suppression, energy, and weight loss but less so than previous courses. The patient also reports no other ill effects. The patient specifically denies heart palpitations, anxiety, and insomnia.    Review of Systems: 10 point review of systems negative unless otherwise noted in HPI  Past Medical History:  Past Medical History:  Diagnosis Date   Allergic rhinitis    Encounter for insertion of mirena IUD    GERD (gastroesophageal reflux disease)    Gonorrhea    Herpes genitalia    High-risk pregnancy 01/17/2014   chorio/mild utering atony   History of hiatal hernia    History of syphilis    Obesity    Sleep apnea    Syphilis     Past Surgical History:  Past Surgical History:  Procedure Laterality Date   BREAST REDUCTION SURGERY Bilateral 11/02/2019   Procedure: MAMMARY REDUCTION  (BREAST);  Surgeon: Cindra Presume, MD;  Location: [place];  Service: Plastics;  Laterality: Bilateral;  3 hours   FRACTURE SURGERY  2004   L left tibia   LAPAROSCOPIC GASTRIC SLEEVE RESECTION N/A 07/25/2015   Procedure: LAPAROSCOPIC GASTRIC SLEEVE RESECTION;  Surgeon: Excell Seltzer, MD;  [facility];  Service: General;  Laterality: N/A;    Gynecologic History: No LMP recorded.  Obstetric History: G2P1011  Family History:  Family History  Problem Relation Age of Onset   Hypertension Father    Hypertension Maternal Grandmother    Diabetes Maternal Grandfather    Hypertension Maternal Grandfather    Other Cousin        breast augmentation 2/2 to pain    Social History:  Social History    Socioeconomic History   Marital status: Married    Spouse name: Insurance claims handler   Number of children: 1   Years of education: college   Highest education level: Not on file  Occupational History   Occupation: AR specialist- Sport and exercise psychologist: [employer]    Comment: [employer]  Tobacco Use   Smoking status: Never Smoker   Smokeless tobacco: Never Used  Vaping Use   Vaping Use: Never used  Substance and Sexual Activity   Alcohol use: Yes    Alcohol/week: 0.0 standard drinks    Comment: not much now, just at holidays wine cooler   Drug use: No   Sexual activity: Yes    Birth control/protection: Inserts  Other Topics Concern   Not on file  Social History Narrative   09/09/19   From: [place] - her mom moved her after [place] finished college   Living: with husband [name] and daughter [name]   Work: Medical illustrator - Insurance claims handler      Family: mom nearby      Enjoys: reading, spend time with daughter - swimming, dancing      Exercise: not currently, planning to start exercise plan with sister   Diet: intermittent fasting       Safety   Seat belts: Yes    Guns: No   Safe in relationships: Yes    Social Determinants of Health   Financial Resource Strain:    Difficulty of  Paying Living Expenses: Not on file  Food Insecurity:    Worried About [place] in the Last Year: Not on file   Ran Out of Food in the Last Year: Not on file  Transportation Needs:    Lack of Transportation (Medical): Not on file   Lack of Transportation (Non-Medical): Not on file  Physical Activity:    Days of Exercise per Week: Not on file   Minutes of Exercise per Session: Not on file  Stress:    Feeling of Stress : Not on file  Social Connections:    Frequency of Communication with Friends and Family: Not on file   Frequency of Social Gatherings with Friends and Family: Not on file   Attends Religious Services: Not on file   Active Member of Clubs or  Organizations: Not on file   Attends Archivist Meetings: Not on file   Marital Status: Not on file  Intimate Partner Violence:    Fear of Current or Ex-Partner: Not on file   Emotionally Abused: Not on file   Physically Abused: Not on file   Sexually Abused: Not on file    Allergies:  No Known Allergies  Medications: Prior to Admission medications   Medication Sig Start Date End Date Taking? Authorizing Provider  amitriptyline (ELAVIL) 100 MG tablet Take 1 tablet (100 mg total) by mouth at bedtime as needed for sleep (and headache). For sleep and headache 05/01/20  Yes Gregor Hams, MD  calcium citrate-vitamin D (CITRACAL+D) 315-200 MG-UNIT tablet Take 1 tablet by mouth daily.   Yes [provider]  Cyanocobalamin (VITAMIN B-12) 1000 MCG/15ML LIQD Take 15 mLs by mouth daily.   Yes [provider]  etonogestrel-ethinyl estradiol (NUVARING) 0.12-0.015 MG/24HR vaginal ring Insert vaginally and leave in place for 3 consecutive weeks, then remove for 1 week. 05/19/19  Yes Dalia Heading, CNM  Multiple Vitamins-Minerals (HAIR SKIN NAILS PO) Take by mouth.   Yes Joline Salt, RN  OVER THE COUNTER MEDICATION Take 2 tablets by mouth at bedtime.   Yes [provider]  Pediatric Multiple Vitamins (FLINTSTONES MULTIVITAMIN PO) Take 1 tablet by mouth daily.   Yes [provider]  phentermine (ADIPEX-P) 37.5 MG tablet Take 1 tablet (37.5 mg total) by mouth daily before breakfast. 04/10/20  Yes [name], MD  valACYclovir (VALTREX) 500 MG tablet Take 500 mg by mouth daily. As needed   Yes [provider]    Physical Exam Blood pressure 128/88, height 5\' 1"  (1.549 m), weight 257 lb (116.6 kg). Wt Readings from Last 3 Encounters:  05/09/20 257 lb (116.6 kg)  04/10/20 258 lb (117 kg)  03/06/20 251 lb 12.8 oz (114.2 kg)  Body mass index is 48.56 kg/m.   General: NAD HEENT: normocephalic, anicteric Thyroid: no  enlargement Pulmonary: no increased work of breathing Neurologic: Grossly intact Psychiatric: mood appropriate, affect full  Assessment: 34 y.o. G2P1011 No problem-specific Assessment & Plan notes found for this encounter.   Plan: Problem List Items Addressed This Visit      Other   Obesity   Relevant Medications   phentermine (ADIPEX-P) 37.5 MG tablet   metFORMIN (GLUCOPHAGE) 500 MG tablet    Other Visit Diagnoses    Need for immunization against influenza    -  Primary   Relevant Orders   Flu Vaccine QUAD 36+ mos IM (Completed)      1) 1500 Calorie ADA Diet  2) Patient education given regarding appropriate lifestyle changes  for weight loss including: regular physical activity, healthy coping strategies, caloric restriction and healthy eating patterns.  3) Patient will be started on weight loss medication. The risks and benefits and side effects of medication, such as Adipex (Phenteramine) ,  Tenuate (Diethylproprion), Belviq (lorcarsin), Contrave (buproprion/naltrexone), Qsymia (phentermine/topiramate), and Saxenda (liraglutide) is discussed. The pros and cons of suppressing appetite and boosting metabolism is discussed. Risks of tolerence and addiction is discussed for selected agents discussed. Use of medicine will ne short term, such as 3-4 months at a time followed by a period of time off of the medicine to avoid these risks and side effects for Adipex, Qsymia, and Tenuate discussed. Pt to call with any negative side effects and agrees to keep follow up appts. - add metformin to phentermine this month  4) Patient to take medication, with the benefits of appetite suppression and metabolism boost d/w pt, along with the side effects and risk factors of long term use that will be avoided with our use of short bursts of therapy. Rx provided.    5) 15 minutes face-to-face; with counseling/coordination of care > 50 percent of visit related to obesity and ongoing management/treatment    6)  Return in about 4 weeks (around 06/06/2020) for medication follow up.    [name], MD, [facility], [facility] 05/09/2020, 11:07 AM

## 2020-05-28 ENCOUNTER — Other Ambulatory Visit: Payer: Self-pay | Admitting: Obstetrics and Gynecology

## 2020-05-29 NOTE — Telephone Encounter (Signed)
Please advise 

## 2020-06-05 ENCOUNTER — Other Ambulatory Visit: Payer: Self-pay

## 2020-06-05 ENCOUNTER — Ambulatory Visit (INDEPENDENT_AMBULATORY_CARE_PROVIDER_SITE_OTHER): Payer: BC Managed Care – PPO | Admitting: Obstetrics and Gynecology

## 2020-06-05 ENCOUNTER — Encounter: Payer: Self-pay | Admitting: Obstetrics and Gynecology

## 2020-06-05 VITALS — BP 142/90 | Ht 61.0 in | Wt 262.0 lb

## 2020-06-05 DIAGNOSIS — Z6841 Body Mass Index (BMI) 40.0 and over, adult: Secondary | ICD-10-CM | POA: Diagnosis not present

## 2020-06-05 MED ORDER — INSULIN PEN NEEDLE 32G X 6 MM MISC: 1.0000 [IU] | 3 refills | Status: DC

## 2020-06-05 MED ORDER — SAXENDA 18 MG/3ML ~~LOC~~ SOPN: PEN_INJECTOR | SUBCUTANEOUS | 2 refills | Status: DC

## 2020-06-05 NOTE — Progress Notes (Signed)
Gynecology Office Visit  Chief Complaint:  Chief Complaint  Patient presents with  . Follow-up    Medication, RM 5    History of Present Illness: Patientis a 34 y.o. G68P1011 female, who presents for the evaluation of the desire to lose weight. She has gained 5lbs over the past month. The patient states the following symptoms since starting her weight loss therapy: appetite suppression, energy, and weight loss have all decreased.  The patient also reports no other ill effects. The patient specifically denies heart palpitations, anxiety, and insomnia.    Review of Systems: 10 point review of systems negative unless otherwise noted in HPI  Past Medical History:  Past Medical History:  Diagnosis Date  . Allergic rhinitis   . Encounter for insertion of mirena IUD   . GERD (gastroesophageal reflux disease)   . Gonorrhea   . Herpes genitalia   . High-risk pregnancy 01/17/2014   chorio/mild utering atony  . History of hiatal hernia   . History of syphilis   . Obesity   . Sleep apnea   . Syphilis     Past Surgical History:  Past Surgical History:  Procedure Laterality Date  . BREAST REDUCTION SURGERY Bilateral 11/02/2019   Procedure: MAMMARY REDUCTION  (BREAST);  Surgeon: Cindra Presume, MD;  Location: [place];  Service: Plastics;  Laterality: Bilateral;  3 hours  . FRACTURE SURGERY  2004   L left tibia  . LAPAROSCOPIC GASTRIC SLEEVE RESECTION N/A 07/25/2015   Procedure: LAPAROSCOPIC GASTRIC SLEEVE RESECTION;  Surgeon: Excell Seltzer, MD;  Location: [facility];  Service: General;  Laterality: N/A;    Gynecologic History: No LMP recorded.  Obstetric History: G2P1011  Family History:  Family History  Problem Relation Age of Onset  . Hypertension Father   . Hypertension Maternal Grandmother   . Diabetes Maternal Grandfather   . Hypertension Maternal Grandfather   . Other Cousin        breast augmentation 2/2 to pain    Social History:  Social  History   Socioeconomic History  . Marital status: Married    Spouse name: Kevon  . Number of children: 1  . Years of education: college  . Highest education level: Not on file  Occupational History  . 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 Smoking status: Never Smoker  . Smokeless tobacco: Never Used  Vaping Use  . Vaping Use: Never used  Substance and Sexual Activity  . Alcohol use: Yes    Alcohol/week: 0.0 standard drinks    Comment: not much now, just at holidays wine cooler  . Drug use: No  . Sexual activity: Yes    Birth control/protection: Inserts  Other Topics Concern  . Not on file  Social History Narrative   09/09/19   From: [place] - her mom moved her after [place] finished college   Living: with husband [name] and daughter [name]   Work: Medical illustrator - Insurance claims handler      Family: mom nearby      Enjoys: reading, spend time with daughter - swimming, dancing      Exercise: not currently, planning to start exercise plan with sister   Diet: intermittent fasting       Safety   Seat belts: Yes    Guns: No   Safe in relationships: Yes    Social Determinants of Radio broadcast assistant Strain:   .  Difficulty of Paying Living Expenses: Not on file  Food Insecurity:   . Worried About Charity fundraiser in the Last Year: Not on file  . Ran Out of Food in the Last Year: Not on file  Transportation Needs:   . Lack of Transportation (Medical): Not on file  . Lack of Transportation (Non-Medical): Not on file  Physical Activity:   . Days of Exercise per Week: Not on file  . Minutes of Exercise per Session: Not on file  Stress:   . Feeling of Stress : Not on file  Social Connections:   . Frequency of Communication with Friends and Family: Not on file  . Frequency of Social Gatherings with Friends and Family: Not on file  . Attends Religious Services: Not on file  . Active Member of Clubs or  Organizations: Not on file  . Attends Archivist Meetings: Not on file  . Marital Status: Not on file  Intimate Partner Violence:   . Fear of Current or Ex-Partner: Not on file  . Emotionally Abused: Not on file  . Physically Abused: Not on file  . Sexually Abused: Not on file    Allergies:  No Known Allergies  Medications: Prior to Admission medications   Medication Sig Start Date End Date Taking? Authorizing Provider  amitriptyline (ELAVIL) 100 MG tablet Take 1 tablet (100 mg total) by mouth at bedtime as needed for sleep (and headache). For sleep and headache 05/01/20  Yes Gregor Hams, MD  calcium citrate-vitamin D (CITRACAL+D) 315-200 MG-UNIT tablet Take 1 tablet by mouth daily.   Yes [provider]  Cyanocobalamin (VITAMIN B-12) 1000 MCG/15ML LIQD Take 15 mLs by mouth daily.   Yes [provider]  etonogestrel-ethinyl estradiol (NUVARING) 0.12-0.015 MG/24HR vaginal ring Insert vaginally and leave in place for 3 consecutive weeks, then remove for 1 week. 05/19/19  Yes Dalia Heading, CNM  Multiple Vitamins-Minerals (HAIR SKIN NAILS PO) Take by mouth.   Yes Joline Salt, RN  Pediatric Multiple Vitamins (FLINTSTONES MULTIVITAMIN PO) Take 1 tablet by mouth daily.   Yes [provider]  phentermine (ADIPEX-P) 37.5 MG tablet Take 1 tablet (37.5 mg total) by mouth daily before breakfast. 05/09/20  Yes [name], MD  valACYclovir (VALTREX) 500 MG tablet Take 500 mg by mouth daily. As needed   Yes [provider]  metFORMIN (GLUCOPHAGE) 500 MG tablet TAKE ONE TABLET BY MOUTH DAILY FOR ONE WEEK. THEN INCREASE TO ONE TABLET TWICE A DAY FOR ONE WEEK. THEN TWO TABLETS TWICE A DAY. Patient not taking: Reported on 06/05/2020 05/29/20   [name], MD  OVER THE COUNTER MEDICATION Take 2 tablets by mouth at bedtime. Patient not taking: Reported on 06/05/2020    [provider]    Physical Exam Blood pressure (!) 142/90,  height 5\' 1"  (1.549 m), weight 262 lb (118.8 kg). Wt Readings from Last 3 Encounters:  06/05/20 262 lb (118.8 kg)  05/09/20 257 lb (116.6 kg)  04/10/20 258 lb (117 kg)  Body mass index is 49.5 kg/m.   General: NAD HEENT: normocephalic, anicteric Thyroid: no enlargement Pulmonary: no increased work of breathing Neurologic: Grossly intact Psychiatric: mood appropriate, affect full  Assessment: 34 y.o. G2P1011 follow up medical weight loss Plan: Problem List Items Addressed This Visit      Other   Obesity - Primary   Relevant Medications   Liraglutide -Weight Management (SAXENDA) 18 MG/3ML SOPN      1) 1500 Calorie ADA Diet  2) Patient education given regarding appropriate lifestyle changes for weight loss including: regular physical activity, healthy coping strategies, caloric restriction and healthy eating patterns.  3) Patient will be started on weight loss medication. The risks and benefits and side effects of medication, such as Adipex (Phenteramine) ,  Tenuate (Diethylproprion), Belviq (lorcarsin), Contrave (buproprion/naltrexone), Qsymia (phentermine/topiramate), and Saxenda (liraglutide) is discussed. The pros and cons of suppressing appetite and boosting metabolism is discussed. Risks of tolerence and addiction is discussed for selected agents discussed. Use of medicine will ne short term, such as 3-4 months at a time followed by a period of time off of the medicine to avoid these risks and side effects for Adipex, Qsymia, and Tenuate discussed. Pt to call with any negative side effects and agrees to keep follow up appts.  4) Patient to take medication, with the benefits of appetite suppression and metabolism boost d/w pt, along with the side effects and risk factors of long term use that will be avoided with our use of short bursts of therapy. Rx provided.   - switch to Saxenda  5) 15 minutes face-to-face; with counseling/coordination of care > 50 percent of visit related  to obesity and ongoing management/treatment   6)  Return in about 3 months (around 09/05/2020) for weight loss follow up.    [name], MD, [facility], [facility] 06/05/2020, 4:38 PM

## 2020-06-15 ENCOUNTER — Other Ambulatory Visit: Payer: Self-pay | Admitting: Obstetrics and Gynecology

## 2020-06-15 MED ORDER — ETONOGESTREL-ETHINYL ESTRADIOL 0.12-0.015 MG/24HR VA RING: VAGINAL_RING | VAGINAL | 11 refills | Status: DC

## 2020-06-27 ENCOUNTER — Ambulatory Visit (INDEPENDENT_AMBULATORY_CARE_PROVIDER_SITE_OTHER): Payer: BC Managed Care – PPO | Admitting: Vascular Surgery

## 2020-06-29 ENCOUNTER — Ambulatory Visit (INDEPENDENT_AMBULATORY_CARE_PROVIDER_SITE_OTHER): Payer: BC Managed Care – PPO | Admitting: Plastic Surgery

## 2020-06-29 ENCOUNTER — Encounter: Payer: Self-pay | Admitting: Plastic Surgery

## 2020-06-29 ENCOUNTER — Other Ambulatory Visit: Payer: Self-pay

## 2020-06-29 VITALS — BP 147/88 | HR 69 | Temp 97.8°F | Ht 61.0 in | Wt 260.0 lb

## 2020-06-29 DIAGNOSIS — N62 Hypertrophy of breast: Secondary | ICD-10-CM

## 2020-06-29 MED ORDER — TRIAMCINOLONE ACETONIDE 0.1 % EX CREA: 1.0000 "application " | TOPICAL_CREAM | Freq: Two times a day (BID) | CUTANEOUS | 0 refills | Status: DC

## 2020-06-29 NOTE — Progress Notes (Signed)
Presents about 8 months out from bilateral breast reduction.  She is overall happy with her result.  She has a small area of skin irritation on the upper outer quadrant of the left side but apart from that she is happy with the breast and is healed nicely.  She is bothered by the lateral upper back skin and soft tissue excess as it gets pinched in her bras.  She wants to see if anything can be done about that.  On exam everything looks to have healed nicely and her scars are not bad and her size shape and symmetry are appropriate.  She has a 2 to 3 cm area of skin irritation and dryness in the upper outer quadrant of the left breast.  She tried Aquaphor on this with little relief.  It itches her pretty consistently.  She does have skin and soft tissue excess laterally as her scar transitions onto her upper back.  For the area of skin irritation I will give her a trial of triamcinolone cream to see if that helps with the itching.  If that does not work I would plan to refer her to dermatology for further evaluation.  Regarding the excess skin and soft tissue laterally of discussed and upper back lift.  I described the location and orientation of the scars.  I described the risks include bleeding, infection, damage surrounding structures and need for additional procedures.  I explained the risk for persistent contour irregularities but I think this would help her.  We will plan to provide a quote to her for this.  All of her questions were answered.

## 2020-07-17 ENCOUNTER — Telehealth: Payer: Self-pay | Admitting: Obstetrics and Gynecology

## 2020-07-17 ENCOUNTER — Other Ambulatory Visit: Payer: Self-pay | Admitting: Obstetrics and Gynecology

## 2020-07-17 DIAGNOSIS — A6000 Herpesviral infection of urogenital system, unspecified: Secondary | ICD-10-CM

## 2020-07-17 MED ORDER — VALACYCLOVIR HCL 500 MG PO TABS: 500.0000 mg | ORAL_TABLET | Freq: Every day | ORAL | 3 refills | Status: DC

## 2020-07-17 NOTE — Telephone Encounter (Signed)
Has been called in.

## 2020-07-17 NOTE — Telephone Encounter (Signed)
Patient is calling requesting refill on valtrex medication.

## 2020-09-01 ENCOUNTER — Other Ambulatory Visit (HOSPITAL_COMMUNITY)
Admission: RE | Admit: 2020-09-01 | Discharge: 2020-09-01 | Disposition: A | Discharge status: Home / Self Care | Payer: BC Managed Care – PPO | Source: Ambulatory Visit | Attending: Obstetrics | Admitting: Obstetrics

## 2020-09-01 ENCOUNTER — Encounter: Payer: Self-pay | Admitting: Obstetrics

## 2020-09-01 ENCOUNTER — Other Ambulatory Visit: Payer: Self-pay

## 2020-09-01 ENCOUNTER — Ambulatory Visit (INDEPENDENT_AMBULATORY_CARE_PROVIDER_SITE_OTHER): Payer: BC Managed Care – PPO | Admitting: Obstetrics

## 2020-09-01 VITALS — BP 120/80 | Ht 61.0 in | Wt 253.0 lb

## 2020-09-01 DIAGNOSIS — Z01419 Encounter for gynecological examination (general) (routine) without abnormal findings: Secondary | ICD-10-CM

## 2020-09-01 DIAGNOSIS — Z124 Encounter for screening for malignant neoplasm of cervix: Secondary | ICD-10-CM

## 2020-09-01 DIAGNOSIS — Z3044 Encounter for surveillance of vaginal ring hormonal contraceptive device: Secondary | ICD-10-CM

## 2020-09-01 DIAGNOSIS — N898 Other specified noninflammatory disorders of vagina: Secondary | ICD-10-CM | POA: Diagnosis not present

## 2020-09-01 DIAGNOSIS — Z113 Encounter for screening for infections with a predominantly sexual mode of transmission: Secondary | ICD-10-CM

## 2020-09-01 MED ORDER — ETONOGESTREL-ETHINYL ESTRADIOL 0.12-0.015 MG/24HR VA RING: VAGINAL_RING | VAGINAL | 11 refills | Status: DC

## 2020-09-01 MED ORDER — FLUCONAZOLE 150 MG PO TABS: 150.0000 mg | ORAL_TABLET | Freq: Once | ORAL | 0 refills | Status: AC

## 2020-09-01 NOTE — Progress Notes (Signed)
Gynecology Annual Exam   PCP: [name], MD  Chief Complaint:  Chief Complaint  Patient presents with  . Gynecologic Exam  . Vaginal Discharge    Itching, irritation, no odor x 1 week    History of Present Illness: Patient is a 35 y.o. G2P1011 presents for annual exam. The patient has no complaints today.   LMP: Patient's last menstrual period was 08/03/2020 (approximate). Average Interval: regular, 28 days Duration of flow: 5 days Heavy Menses: yes Clots: no Intermenstrual Bleeding: no Postcoital Bleeding: no Dysmenorrhea: no  The patient is sexually active. She currently uses NuvaRing vaginal inserts for contraception. She denies dyspareunia.  The patient does perform self breast exams.  There is no notable family history of breast or ovarian cancer in her family.  The patient wears seatbelts: yes.   The patient has regular exercise: no.    The patient denies current symptoms of depression.    Review of Systems: Review of Systems  Constitutional: Negative.   HENT: Negative.   Eyes: Negative.   Respiratory: Negative.   Cardiovascular: Negative.   Gastrointestinal: Negative.   Musculoskeletal: Negative.   Skin: Positive for itching.       Perineal itching  Neurological: Negative.   Endo/Heme/Allergies: Negative.   Psychiatric/Behavioral: Negative.     Past Medical History:  Patient Active Problem List   Diagnosis Date Noted  . Lymphedema 01/25/2020  . Pain in limb 12/21/2019  . Swelling of limb 12/21/2019  . S/P bilateral breast reduction 11/24/2019  . Macromastia 09/09/2019  . Upper back pain 09/09/2019  . Hx of fracture of tibia 09/09/2019  . History of bariatric surgery 05/09/2019  . History of syphilis 05/09/2019  . Herpes genitalia 05/09/2019  . History of chlamydia 10/25/2016  . Morbid obesity with body mass index of 40.0-49.9 ([place]) 07/25/2015  . Well woman exam 01/03/2015  . OSA (obstructive sleep apnea) 08/02/2011    NPSG 08/2011:  AHI  20/hr, desat to 80% CPAP titration study 09/2013:  Optimal pressure 16cm   . Insomnia 08/02/2011  . Obesity 08/02/2011    Past Surgical History:  Past Surgical History:  Procedure Laterality Date  . BREAST REDUCTION SURGERY Bilateral 11/02/2019   Procedure: MAMMARY REDUCTION  (BREAST);  Surgeon: Cindra Presume, MD;  Location: [place];  Service: Plastics;  Laterality: Bilateral;  3 hours  . FRACTURE SURGERY  2004   L left tibia  . LAPAROSCOPIC GASTRIC SLEEVE RESECTION N/A 07/25/2015   Procedure: LAPAROSCOPIC GASTRIC SLEEVE RESECTION;  Surgeon: Excell Seltzer, MD;  Location: [facility];  Service: General;  Laterality: N/A;    Gynecologic History:  Patient's last menstrual period was 08/03/2020 (approximate). Contraception: NuvaRing vaginal inserts Last Pap: Results were: no abnormalities   Obstetric History: G2P1011  Family History:  Family History  Problem Relation Age of Onset  . Hypertension Father   . Hypertension Maternal Grandmother   . Diabetes Maternal Grandfather   . Hypertension Maternal Grandfather   . Other Cousin        breast augmentation 2/2 to pain    Social History:  Social History   Socioeconomic History  . Marital status: Married    Spouse name: Kevon  . Number of children: 1  . Years of education: college  . Highest education level: Not on file  Occupational History  . 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 Smoking status: Never Smoker  .  Smokeless tobacco: Never Used  Vaping Use  . Vaping Use: Never used  Substance and Sexual Activity  . Alcohol use: Yes    Alcohol/week: 0.0 standard drinks    Comment: not much now, just at holidays wine cooler  . Drug use: No  . Sexual activity: Yes    Birth control/protection: Inserts    Comment: Nuvaring  Other Topics Concern  . Not on file  Social History Narrative   09/09/19   From: [place] - her mom moved her after [place]  finished college   Living: with husband [name] and daughter [name]   Work: Medical illustrator - Insurance claims handler      Family: mom nearby      Enjoys: reading, spend time with daughter - swimming, dancing      Exercise: not currently, planning to start exercise plan with sister   Diet: intermittent fasting       Safety   Seat belts: Yes    Guns: No   Safe in relationships: Yes    Social Determinants of Radio broadcast assistant Strain: Not on file  Food Insecurity: Not on file  Transportation Needs: Not on file  Physical Activity: Not on file  Stress: Not on file  Social Connections: Not on file  Intimate Partner Violence: Not on file    Allergies:  No Known Allergies  Medications: Prior to Admission medications   Medication Sig Start Date End Date Taking? Authorizing Provider  amitriptyline (ELAVIL) 100 MG tablet Take 1 tablet (100 mg total) by mouth at bedtime as needed for sleep (and headache). For sleep and headache 05/01/20  Yes Gregor Hams, MD  calcium citrate-vitamin D (CITRACAL+D) 315-200 MG-UNIT tablet Take 1 tablet by mouth daily.   Yes [provider]  Cyanocobalamin (VITAMIN B-12) 1000 MCG/15ML LIQD Take 15 mLs by mouth daily.   Yes [provider]  etonogestrel-ethinyl estradiol (NUVARING) 0.12-0.015 MG/24HR vaginal ring Insert vaginally and leave in place for 3 consecutive weeks, then remove for 1 week. 06/15/20  Yes [name], MD  Insulin Pen Needle 32G X 6 MM MISC 1 Units by Does not apply route as directed. 06/05/20  Yes [name], MD  Liraglutide -Weight Management (SAXENDA) 18 MG/3ML SOPN Inject 0.6 mg into the skin daily for 7 days, THEN 1.2 mg daily for 7 days, THEN 1.8 mg daily for 7 days, THEN 2.4 mg daily for 7 days, THEN 3 mg daily. 06/05/20 10/01/20 Yes [name], MD  Multiple Vitamins-Minerals (HAIR SKIN NAILS PO) Take by mouth.   Yes Joline Salt, RN  Pediatric Multiple Vitamins (FLINTSTONES MULTIVITAMIN  PO) Take 1 tablet by mouth daily.   Yes [provider]  triamcinolone (KENALOG) 0.1 % Apply 1 application topically 2 (two) times daily. 06/29/20  Yes Cindra Presume, MD  valACYclovir (VALTREX) 500 MG tablet Take 1 tablet (500 mg total) by mouth daily. As needed 07/17/20  Yes [name], MD  phentermine (ADIPEX-P) 37.5 MG tablet Take 1 tablet (37.5 mg total) by mouth daily before breakfast. Patient not taking: Reported on 09/01/2020 05/09/20   [name], MD    Physical Exam Vitals: Blood pressure 120/80, height 5\' 1"  (1.549 m), weight 253 lb (114.8 kg), last menstrual period 08/03/2020.  General: NAD HEENT: normocephalic, anicteric Thyroid: no enlargement, no palpable nodules Pulmonary: No increased work of breathing, CTAB Cardiovascular: RRR, distal pulses 2+ Breast: Breast symmetrical, no tenderness, has mastopexy scars (keyhole) well healed. no palpable nodules or masses, no skin or  nipple retraction present, no nipple discharge.  No axillary or supraclavicular lymphadenopathy. Abdomen: High BMI- adipose,ABS, soft, non-tender, non-distended.  Umbilicus without lesions.  No hepatomegaly, splenomegaly or masses palpable. No evidence of hernia  Genitourinary:  External: Normal external female genitalia.  Normal urethral meatus, normal Bartholin's and Skene's glands.    Vagina: Normal vaginal mucosa, no evidence of prolapse.    Cervix: Grossly normal in appearance, no bleeding, scant amount f white, clumpy discharge, no odor  Uterus: Non-enlarged, mobile, normal contour.  No CMT  Adnexa: ovaries non-enlarged, no adnexal masses  Rectal: deferred  Lymphatic: no evidence of inguinal lymphadenopathy Extremities: no edema, erythema, or tenderness Neurologic: Grossly intact Psychiatric: mood appropriate, affect full  Female chaperone present for pelvic and breast  portions of the physical exam    Assessment: 35 y.o. G2P1011 routine annual exam Vaginal discharge-  suspect yeast vaginitis Desires STI screening- hx of +RPR  Plan: Problem List Items Addressed This Visit   None   Visit Diagnoses    Women's annual routine gynecological examination    -  Primary   Relevant Orders   Cytology - PAP   HEP, RPR, HIV Panel   Cervical cancer screening       Relevant Orders   Cytology - PAP   Routine screening for STI (sexually transmitted infection)       Relevant Orders   Cytology - PAP   HEP, RPR, HIV Panel      2) STI screening  wasoffered and accepted  2)  ASCCP guidelines and rational discussed.  Patient opts for yearly screening interval  3) Contraception - the patient is currently using  NuvaRing vaginal inserts.  She is happy with her current form of contraception and plans to continue. RX for another year of Nuva Rings provided.  4) Routine healthcare maintenance including cholesterol, diabetes screening discussed managed by PCP  5) Return for needs a lab draw visit. Lab not available 6) suspect yeast vaginitis. Nuswab sent to lab. Will Rx Diflucan. Instucted in how to use. May also opt to try OTC Monistat. &) She will see Dr. Georgianne Fick for weight loss management in March. Encouraged her to restart exercise program at hte gym in addition to taking her medication.  Imagene Riches, CNM  09/01/2020 4:38 PM  [facility], [facility] 09/01/2020, 4:36 PM

## 2020-09-04 ENCOUNTER — Other Ambulatory Visit: Payer: Self-pay

## 2020-09-04 ENCOUNTER — Other Ambulatory Visit: Payer: BC Managed Care – PPO

## 2020-09-04 DIAGNOSIS — Z113 Encounter for screening for infections with a predominantly sexual mode of transmission: Secondary | ICD-10-CM

## 2020-09-04 DIAGNOSIS — Z01419 Encounter for gynecological examination (general) (routine) without abnormal findings: Secondary | ICD-10-CM

## 2020-09-05 LAB — RPR, QUANT. (REFLEX): Rapid Plasma Reagin, Quant: 1:1 {titer} — ABNORMAL HIGH

## 2020-09-05 LAB — HEP, RPR, HIV PANEL
HIV Screen 4th Generation wRfx: NONREACTIVE
Hepatitis B Surface Ag: NEGATIVE
RPR Ser Ql: REACTIVE — AB

## 2020-09-06 ENCOUNTER — Telehealth: Payer: Self-pay | Admitting: Plastic Surgery

## 2020-09-06 LAB — CYTOLOGY - PAP
Chlamydia: NEGATIVE
Comment: NEGATIVE
Comment: NEGATIVE
Comment: NEGATIVE
Comment: NORMAL
Diagnosis: NEGATIVE
High risk HPV: NEGATIVE
Neisseria Gonorrhea: NEGATIVE
Trichomonas: NEGATIVE

## 2020-09-06 NOTE — Telephone Encounter (Signed)
Multiple attempts to contact patient. Voicemail is full and I am unable to leave a message. If patient calls back, please advise.

## 2020-09-07 LAB — NUSWAB BV AND CANDIDA, NAA
Candida albicans, NAA: POSITIVE — AB
Candida glabrata, NAA: NEGATIVE

## 2020-09-08 ENCOUNTER — Other Ambulatory Visit: Payer: Self-pay

## 2020-09-08 ENCOUNTER — Ambulatory Visit (INDEPENDENT_AMBULATORY_CARE_PROVIDER_SITE_OTHER): Payer: BC Managed Care – PPO | Admitting: Obstetrics and Gynecology

## 2020-09-08 ENCOUNTER — Encounter: Payer: Self-pay | Admitting: Obstetrics and Gynecology

## 2020-09-08 DIAGNOSIS — Z6841 Body Mass Index (BMI) 40.0 and over, adult: Secondary | ICD-10-CM

## 2020-09-08 MED ORDER — INSULIN PEN NEEDLE 32G X 6 MM MISC: 1.0000 [IU] | 3 refills | Status: DC

## 2020-09-08 MED ORDER — SAXENDA 18 MG/3ML ~~LOC~~ SOPN: 3.0000 mg | PEN_INJECTOR | Freq: Every day | SUBCUTANEOUS | 3 refills | Status: DC

## 2020-09-08 NOTE — Progress Notes (Signed)
Gynecology Office Visit  Chief Complaint:  Chief Complaint  Nancy Owen presents with  . Follow-up    F/U medication, no concerns. RM 5    History of Present Illness: Patientis a 35 y.o. G59P1011 female, who presents for the evaluation of the desire to lose weight. She has lost 11 pounds 3 months. The Nancy Owen states the following symptoms since starting her weight loss therapy: appetite suppression, energy, and weight loss.  The Nancy Owen also reports no other ill effects. The Nancy Owen specifically denies heart palpitations, anxiety, and insomnia.    Review of Systems: 10 point review of systems negative unless otherwise noted in HPI  Past Medical History:  Past Medical History:  Diagnosis Date  . Allergic rhinitis   . Encounter for insertion of mirena IUD   . GERD (gastroesophageal reflux disease)   . Gonorrhea   . Herpes genitalia   . High-risk pregnancy 01/17/2014   chorio/mild utering atony  . History of hiatal hernia   . History of syphilis   . Obesity   . Sleep apnea   . Syphilis     Past Surgical History:  Past Surgical History:  Procedure Laterality Date  . BREAST REDUCTION SURGERY Bilateral 11/02/2019   Procedure: MAMMARY REDUCTION  (BREAST);  Surgeon: Cindra Presume, MD;  Location: [facility];  Service: Plastics;  Laterality: Bilateral;  3 hours  . FRACTURE SURGERY  2004   L left tibia  . LAPAROSCOPIC GASTRIC SLEEVE RESECTION N/A 07/25/2015   Procedure: LAPAROSCOPIC GASTRIC SLEEVE RESECTION;  Surgeon: Excell Seltzer, MD;  Location: [facility];  Service: General;  Laterality: N/A;    Gynecologic History: Nancy Owen's last menstrual period was 08/31/2020.  Obstetric History: G2P1011  Family History:  Family History  Problem Relation Age of Onset  . Hypertension Father   . Hypertension Maternal Grandmother   . Diabetes Maternal Grandfather   . Hypertension Maternal Grandfather   . Other Cousin        breast augmentation 2/2 to pain    Social  History:  Social History   Socioeconomic History  . Marital status: Married    Spouse name: Kevon  . Number of children: 1  . Years of education: college  . Highest education level: Not on file  Occupational History  . 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 Smoking status: Never Smoker  . Smokeless tobacco: Never Used  Vaping Use  . Vaping Use: Never used  Substance and Sexual Activity  . Alcohol use: Yes    Alcohol/week: 0.0 standard drinks    Comment: not much now, just at holidays wine cooler  . Drug use: No  . Sexual activity: Yes    Birth control/protection: Inserts    Comment: Nuvaring  Other Topics Concern  . Not on file  Social History Narrative   09/09/19   From: [place] - her mom moved her after [place] finished college   Living: with husband [name] and daughter [name]   Work: Medical illustrator - Insurance claims handler      Family: mom nearby      Enjoys: reading, spend time with daughter - swimming, dancing      Exercise: not currently, planning to start exercise plan with sister   Diet: intermittent fasting       Safety   Seat belts: Yes    Guns: No   Safe in relationships: Yes    Social Determinants of  Health   Financial Resource Strain: Not on file  Food Insecurity: Not on file  Transportation Needs: Not on file  Physical Activity: Not on file  Stress: Not on file  Social Connections: Not on file  Intimate Partner Violence: Not on file    Allergies:  No Known Allergies  Medications: Prior to Admission medications   Medication Sig Start Date End Date Taking? Authorizing Provider  amitriptyline (ELAVIL) 100 MG tablet Take 1 tablet (100 mg total) by mouth at bedtime as needed for sleep (and headache). For sleep and headache 05/01/20  Yes Gregor Hams, MD  Liraglutide -Weight Management (SAXENDA) 18 MG/3ML SOPN Inject 0.6 mg into the skin daily for 7 days, THEN 1.2 mg daily for 7 days, THEN  1.8 mg daily for 7 days, THEN 2.4 mg daily for 7 days, THEN 3 mg daily. 06/05/20 10/01/20 Yes [name], MD  Multiple Vitamins-Minerals (HAIR SKIN NAILS PO) Take by mouth.   Yes Joline Salt, RN  Pediatric Multiple Vitamins (FLINTSTONES MULTIVITAMIN PO) Take 1 tablet by mouth daily.   Yes [provider]  triamcinolone (KENALOG) 0.1 % Apply 1 application topically 2 (two) times daily. 06/29/20  Yes Cindra Presume, MD  calcium citrate-vitamin D (CITRACAL+D) 315-200 MG-UNIT tablet Take 1 tablet by mouth daily.    [provider]  Cyanocobalamin (VITAMIN B-12) 1000 MCG/15ML LIQD Take 15 mLs by mouth daily.    [provider]  etonogestrel-ethinyl estradiol (NUVARING) 0.12-0.015 MG/24HR vaginal ring Insert vaginally and leave in place for 3 consecutive weeks, then remove for 1 week. 06/15/20   [name], MD  etonogestrel-ethinyl estradiol (NUVARING) 0.12-0.015 MG/24HR vaginal ring Insert vaginally and leave in place for 3 consecutive weeks, then remove for 1 week. 09/01/20   Imagene Riches, CNM  Insulin Pen Needle 32G X 6 MM MISC 1 Units by Does not apply route as directed. 06/05/20   [name], MD  phentermine (ADIPEX-P) 37.5 MG tablet Take 1 tablet (37.5 mg total) by mouth daily before breakfast. Nancy Owen not taking: Reported on 09/01/2020 05/09/20   [name], MD  valACYclovir (VALTREX) 500 MG tablet Take 1 tablet (500 mg total) by mouth daily. As needed 07/17/20   [name], MD    Physical Exam Blood pressure 128/80, height 5\' 1"  (1.549 m), weight 251 lb (113.9 kg), last menstrual period 08/31/2020. Wt Readings from Last 3 Encounters:  09/08/20 251 lb (113.9 kg)  09/01/20 253 lb (114.8 kg)  06/29/20 260 lb (117.9 kg)  Body mass index is 47.43 kg/m.  General: NAD HEENT: normocephalic, anicteric Thyroid: no enlargement Pulmonary: no increased work of breathing Neurologic: Grossly intact Psychiatric: mood appropriate, affect  full  Assessment: 35 y.o. G2P1011 No problem-specific Assessment & Plan notes found for this encounter.   Plan: Problem List Items Addressed This Visit      Other   Obesity - Primary   Relevant Medications   Liraglutide -Weight Management (SAXENDA) 18 MG/3ML SOPN      1) 1500 Calorie ADA Diet  2) Nancy Owen education given regarding appropriate lifestyle changes for weight loss including: regular physical activity, healthy coping strategies, caloric restriction and healthy eating patterns.  3) Nancy Owen will be started on weight loss medication. The risks and benefits and side effects of medication, such as Adipex (Phenteramine) ,  Tenuate (Diethylproprion), Belviq (lorcarsin), Contrave (buproprion/naltrexone), Qsymia (phentermine/topiramate), and Saxenda (liraglutide) is discussed. The pros and cons of suppressing appetite and boosting metabolism is discussed. Risks of tolerence and addiction is discussed  for selected agents discussed. Use of medicine will ne short term, such as 3-4 months at a time followed by a period of time off of the medicine to avoid these risks and side effects for Adipex, Qsymia, and Tenuate discussed. Pt to call with any negative side effects and agrees to keep follow up appts.  4) Nancy Owen to take medication, with the benefits of appetite suppression and metabolism boost d/w pt, along with the side effects and risk factors of long term use that will be avoided with our use of short bursts of therapy. Rx provided.   - continue saxenda  5) 15 minutes face-to-face; with counseling/coordination of care > 50 percent of visit related to obesity and ongoing management/treatment   6)  Return in about 1 year (around 09/08/2021) for annual.    [name], MD, [place], [facility] 09/08/2020, 4:24 PM

## 2020-09-13 ENCOUNTER — Encounter: Payer: Self-pay | Admitting: Obstetrics

## 2020-09-15 ENCOUNTER — Telehealth: Payer: Self-pay

## 2020-09-15 NOTE — Telephone Encounter (Signed)
Pt calling; Saxenda rx is still $1000; can we do it through [place]?  [place]

## 2020-09-20 ENCOUNTER — Other Ambulatory Visit: Payer: Self-pay | Admitting: Obstetrics

## 2020-09-20 DIAGNOSIS — A530 Latent syphilis, unspecified as early or late: Secondary | ICD-10-CM

## 2020-09-21 NOTE — Progress Notes (Signed)
Phone call to patient who will need to come to the office for a T. Pallidum test in the lab. This has been ordered. Imagene Riches, CNM  09/21/2020 11:44 AM

## 2020-10-11 NOTE — Progress Notes (Signed)
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 She ahs been encouraged to come to [facility] for another draw as her T Pallidum was not done by the lab.  Called her again today and she shared that she would come in for the draw this afternoon. Imagene Riches, CNM  10/11/2020 6:19 PM

## 2020-10-17 ENCOUNTER — Other Ambulatory Visit: Payer: BC Managed Care – PPO

## 2020-10-17 ENCOUNTER — Other Ambulatory Visit: Payer: Self-pay

## 2020-10-18 ENCOUNTER — Encounter: Payer: Self-pay | Admitting: Obstetrics

## 2020-10-19 ENCOUNTER — Encounter: Payer: Self-pay | Admitting: Emergency Medicine

## 2020-10-19 ENCOUNTER — Emergency Department: Payer: BC Managed Care – PPO

## 2020-10-19 ENCOUNTER — Emergency Department
Admission: EM | Admit: 2020-10-19 | Discharge: 2020-10-19 | Disposition: A | Discharge status: Home / Self Care | Payer: BC Managed Care – PPO | Attending: Emergency Medicine | Admitting: Emergency Medicine

## 2020-10-19 ENCOUNTER — Other Ambulatory Visit: Payer: Self-pay

## 2020-10-19 DIAGNOSIS — R0789 Other chest pain: Secondary | ICD-10-CM | POA: Insufficient documentation

## 2020-10-19 DIAGNOSIS — K219 Gastro-esophageal reflux disease without esophagitis: Secondary | ICD-10-CM | POA: Insufficient documentation

## 2020-10-19 DIAGNOSIS — R079 Chest pain, unspecified: Secondary | ICD-10-CM

## 2020-10-19 DIAGNOSIS — R1013 Epigastric pain: Secondary | ICD-10-CM | POA: Insufficient documentation

## 2020-10-19 LAB — CBC
HCT: 30.1 % — ABNORMAL LOW (ref 36.0–46.0)
Hemoglobin: 8.8 g/dL — ABNORMAL LOW (ref 12.0–15.0)
MCH: 21 pg — ABNORMAL LOW (ref 26.0–34.0)
MCHC: 29.2 g/dL — ABNORMAL LOW (ref 30.0–36.0)
MCV: 71.8 fL — ABNORMAL LOW (ref 80.0–100.0)
Platelets: 385 10*3/uL (ref 150–400)
RBC: 4.19 MIL/uL (ref 3.87–5.11)
RDW: 17.3 % — ABNORMAL HIGH (ref 11.5–15.5)
WBC: 18 10*3/uL — ABNORMAL HIGH (ref 4.0–10.5)
nRBC: 0 % (ref 0.0–0.2)

## 2020-10-19 LAB — HEPATIC FUNCTION PANEL
ALT: 22 U/L (ref 0–44)
AST: 23 U/L (ref 15–41)
Albumin: 3.2 g/dL — ABNORMAL LOW (ref 3.5–5.0)
Alkaline Phosphatase: 87 U/L (ref 38–126)
Bilirubin, Direct: <0.1 mg/dL (ref 0.0–0.2)
Total Bilirubin: 0.3 mg/dL (ref 0.3–1.2)
Total Protein: 7.8 g/dL (ref 6.5–8.1)

## 2020-10-19 LAB — BASIC METABOLIC PANEL
Anion gap: 8 (ref 5–15)
BUN: 10 mg/dL (ref 6–20)
CO2: 21 mmol/L — ABNORMAL LOW (ref 22–32)
Calcium: 8.9 mg/dL (ref 8.9–10.3)
Chloride: 107 mmol/L (ref 98–111)
Creatinine, Ser: 0.61 mg/dL (ref 0.44–1.00)
GFR, Estimated: >60 mL/min (ref >60)
Glucose, Bld: 162 mg/dL — ABNORMAL HIGH (ref 70–99)
Potassium: 4 mmol/L (ref 3.5–5.1)
Sodium: 136 mmol/L (ref 135–145)

## 2020-10-19 LAB — D-DIMER, QUANTITATIVE: D-Dimer, Quant: 1.2 ug/mL-FEU — ABNORMAL HIGH (ref 0.00–0.50)

## 2020-10-19 LAB — LIPASE, BLOOD: Lipase: 28 U/L (ref 11–51)

## 2020-10-19 LAB — TROPONIN I (HIGH SENSITIVITY)
Troponin I (High Sensitivity): 3 ng/L (ref <18)
Troponin I (High Sensitivity): 5 ng/L (ref <18)

## 2020-10-19 LAB — POC URINE PREG, ED: Preg Test, Ur: NEGATIVE

## 2020-10-19 LAB — T.PALLIDUM AB, TOTAL: T Pallidum Abs: REACTIVE — AB

## 2020-10-19 LAB — HCG, QUANTITATIVE, PREGNANCY: hCG, Beta Chain, Quant, S: 2 m[IU]/mL (ref <5)

## 2020-10-19 MED ORDER — LIDOCAINE VISCOUS HCL 2 % MT SOLN
15.0000 mL | Freq: Once | OROMUCOSAL | Status: AC
  Administered 2020-10-19: 15 mL via ORAL
  Filled 2020-10-19: qty 15

## 2020-10-19 MED ORDER — MORPHINE SULFATE (PF) 4 MG/ML IV SOLN
4.0000 mg | Freq: Once | INTRAVENOUS | Status: AC
  Administered 2020-10-19: 4 mg via INTRAVENOUS
  Filled 2020-10-19: qty 1

## 2020-10-19 MED ORDER — PANTOPRAZOLE SODIUM 40 MG PO TBEC: 40.0000 mg | DELAYED_RELEASE_TABLET | Freq: Every day | ORAL | 0 refills | Status: DC

## 2020-10-19 MED ORDER — ALUM & MAG HYDROXIDE-SIMETH 200-200-20 MG/5ML PO SUSP
30.0000 mL | Freq: Once | ORAL | Status: AC
  Administered 2020-10-19: 30 mL via ORAL
  Filled 2020-10-19: qty 30

## 2020-10-19 MED ORDER — SUCRALFATE 1 G PO TABS: 1.0000 g | ORAL_TABLET | Freq: Four times a day (QID) | ORAL | 0 refills | Status: DC

## 2020-10-19 MED ORDER — ONDANSETRON HCL 4 MG/2ML IJ SOLN
4.0000 mg | Freq: Once | INTRAMUSCULAR | Status: AC
  Administered 2020-10-19: 4 mg via INTRAVENOUS
  Filled 2020-10-19: qty 2

## 2020-10-19 MED ORDER — IOHEXOL 350 MG/ML SOLN
100.0000 mL | Freq: Once | INTRAVENOUS | Status: AC | PRN
  Administered 2020-10-19: 100 mL via INTRAVENOUS

## 2020-10-19 NOTE — ED Triage Notes (Signed)
Pt c/o substernal CP that started mid afternooon, pt states pain worse with deep breaths. Pt states pain is a tightness under R part of her chest. Pt states took tums, drank ginger ale and took bra off to relieve pain. Pt arrives A&O x4, able to speak in complete sentences, and tearful to ED.

## 2020-10-19 NOTE — ED Notes (Signed)
Helped Pt to hook up after using toilet.  Sent Urine Sample to lab.

## 2020-10-19 NOTE — ED Notes (Signed)
POC preg performed, results negative.

## 2020-10-19 NOTE — Discharge Instructions (Addendum)
We are going to prescribe medication for heartburn/ulcers to help your pain  Try the medication as prescribed  Avoid spicy foods or foods high in acid (like tomato sauce, apple/fruit juices)

## 2020-10-19 NOTE — ED Provider Notes (Signed)
[facility] Emergency Department Provider Note  ____________________________________________   Event Date/Time   First MD Initiated Contact with Patient 10/19/20 1558     (approximate)  I have reviewed the triage vital signs and the nursing notes.   HISTORY  Chief Complaint Chest Pain    HPI Nancy Owen is a 35 y.o. female here with chest pain. Patient reports she was at work today when she developed acute onset of a burning, substernal and epigastric abdominal pain.  She then began to develop a right-sided pain which was somewhat sharp and positional.  She states that she did not have any associated nausea, vomiting, or shortness of breath.  The pain seemed to radiate primarily down towards her right chest rather than her epigastric area and has had persistent.  She did have some positional relief with when she was lying on the left side.  She had no shortness of breath.  No leg swelling.  No history of DVT or PE.  She does have history of gastric sleeve but denies history of ulcers or GERD.  She was drinking coffee at the time.  No specific alleviating factors, and symptoms have largely resolved at the time of my assessment in the ED.  No known history of coronary disease.  No recent illness.  No recent medication changes.        Past Medical History:  Diagnosis Date  . Allergic rhinitis   . Encounter for insertion of mirena IUD   . GERD (gastroesophageal reflux disease)   . Gonorrhea   . Herpes genitalia   . High-risk pregnancy 01/17/2014   chorio/mild utering atony  . History of hiatal hernia   . History of syphilis   . Obesity   . Sleep apnea   . Syphilis     Patient Active Problem List   Diagnosis Date Noted  . Lymphedema 01/25/2020  . Pain in limb 12/21/2019  . Swelling of limb 12/21/2019  . S/P bilateral breast reduction 11/24/2019  . Macromastia 09/09/2019  . Upper back pain 09/09/2019  . Hx of fracture of tibia 09/09/2019  .  History of bariatric surgery 05/09/2019  . History of syphilis 05/09/2019  . Herpes genitalia 05/09/2019  . Morbid obesity with body mass index of 40.0-49.9 ([place]) 07/25/2015  . Well woman exam 01/03/2015  . OSA (obstructive sleep apnea) 08/02/2011  . Insomnia 08/02/2011  . Obesity 08/02/2011    Past Surgical History:  Procedure Laterality Date  . BREAST REDUCTION SURGERY Bilateral 11/02/2019   Procedure: MAMMARY REDUCTION  (BREAST);  Surgeon: Cindra Presume, MD;  Location: [facility];  Service: Plastics;  Laterality: Bilateral;  3 hours  . FRACTURE SURGERY  2004   L left tibia  . LAPAROSCOPIC GASTRIC SLEEVE RESECTION N/A 07/25/2015   Procedure: LAPAROSCOPIC GASTRIC SLEEVE RESECTION;  Surgeon: Excell Seltzer, MD;  Location: [facility];  Service: General;  Laterality: N/A;    Prior to Admission medications   Medication Sig Start Date End Date Taking? Authorizing Provider  pantoprazole (PROTONIX) 40 MG tablet Take 1 tablet (40 mg total) by mouth daily for 14 days. 10/19/20 11/02/20 Yes Duffy Bruce, MD  sucralfate (CARAFATE) 1 g tablet Take 1 tablet (1 g total) by mouth 4 (four) times daily for 7 days. 10/19/20 10/26/20 Yes Duffy Bruce, MD  amitriptyline (ELAVIL) 100 MG tablet Take 1 tablet (100 mg total) by mouth at bedtime as needed for sleep (and headache). For sleep and headache 05/01/20   Gregor Hams,  MD  calcium citrate-vitamin D (CITRACAL+D) 315-200 MG-UNIT tablet Take 1 tablet by mouth daily.    [provider]  Cyanocobalamin (VITAMIN B-12) 1000 MCG/15ML LIQD Take 15 mLs by mouth daily.    [provider]  etonogestrel-ethinyl estradiol (NUVARING) 0.12-0.015 MG/24HR vaginal ring Insert vaginally and leave in place for 3 consecutive weeks, then remove for 1 week. 06/15/20   [name], MD  etonogestrel-ethinyl estradiol (NUVARING) 0.12-0.015 MG/24HR vaginal ring Insert vaginally and leave in place for 3 consecutive weeks, then remove for 1  week. 09/01/20   Imagene Riches, CNM  Insulin Pen Needle 32G X 6 MM MISC 1 Units by Does not apply route as directed. 09/08/20   [name], MD  Liraglutide -Weight Management (SAXENDA) 18 MG/3ML SOPN Inject 3 mg into the skin daily. 09/08/20   [name], MD  Multiple Vitamins-Minerals (HAIR SKIN NAILS PO) Take by mouth.    Joline Salt, RN  Pediatric Multiple Vitamins (FLINTSTONES MULTIVITAMIN PO) Take 1 tablet by mouth daily.    [provider]  phentermine (ADIPEX-P) 37.5 MG tablet Take 1 tablet (37.5 mg total) by mouth daily before breakfast. Patient not taking: Reported on 09/01/2020 05/09/20   [name], MD  triamcinolone (KENALOG) 0.1 % Apply 1 application topically 2 (two) times daily. 06/29/20   Cindra Presume, MD  valACYclovir (VALTREX) 500 MG tablet Take 1 tablet (500 mg total) by mouth daily. As needed 07/17/20   [name], MD    Allergies Patient has no known allergies.  Family History  Problem Relation Age of Onset  . Hypertension Father   . Hypertension Maternal Grandmother   . Diabetes Maternal Grandfather   . Hypertension Maternal Grandfather   . Other Cousin        breast augmentation 2/2 to pain    Social History Social History   Tobacco Use  . Smoking status: Never Smoker  . Smokeless tobacco: Never Used  Vaping Use  . Vaping Use: Never used  Substance Use Topics  . Alcohol use: Yes    Alcohol/week: 0.0 standard drinks    Comment: not much now, just at holidays wine cooler  . Drug use: No    Review of Systems  Review of Systems  Constitutional: Positive for fatigue. Negative for fever.  HENT: Negative for congestion and sore throat.   Eyes: Negative for visual disturbance.  Respiratory: Positive for chest tightness. Negative for cough.   Cardiovascular: Positive for chest pain.  Gastrointestinal: Negative for abdominal pain, diarrhea, nausea and vomiting.  Genitourinary: Negative for flank pain.   Musculoskeletal: Negative for back pain and neck pain.  Skin: Negative for rash and wound.  Neurological: Negative for weakness.  All other systems reviewed and are negative.    ____________________________________________  PHYSICAL EXAM:      VITAL SIGNS: ED Triage Vitals  Enc Vitals Group     BP 10/19/20 1548 125/76     Pulse Rate 10/19/20 1548 100     Resp 10/19/20 1548 20     Temp 10/19/20 1548 98 F (36.7 C)     Temp Source 10/19/20 1548 Oral     SpO2 10/19/20 1548 98 %     Weight 10/19/20 1545 254 lb (115.2 kg)     Height 10/19/20 1545 5\' 1"  (1.549 m)     Head Circumference --      Peak Flow --      Pain Score 10/19/20 1545 7     Pain Loc --  Pain Edu? --      Excl. in [place]? --      Physical Exam Vitals and nursing note reviewed.  Constitutional:      General: She is not in acute distress.    Appearance: She is well-developed.  HENT:     Head: Normocephalic and atraumatic.  Eyes:     Conjunctiva/sclera: Conjunctivae normal.  Cardiovascular:     Rate and Rhythm: Normal rate and regular rhythm.     Heart sounds: Normal heart sounds. No murmur heard. No friction rub.  Pulmonary:     Effort: Pulmonary effort is normal. No respiratory distress.     Breath sounds: Normal breath sounds. No wheezing or rales.  Chest:     Comments: Moderate right lower chest wall pain.  Abdominal:     General: There is no distension.     Palpations: Abdomen is soft.     Tenderness: There is no abdominal tenderness.  Musculoskeletal:     Cervical back: Neck supple.  Skin:    General: Skin is warm.     Capillary Refill: Capillary refill takes less than 2 seconds.  Neurological:     Mental Status: She is alert and oriented to person, place, and time.     Motor: No abnormal muscle tone.       ____________________________________________   LABS (all labs ordered are listed, but only abnormal results are displayed)  Labs Reviewed  BASIC METABOLIC PANEL - Abnormal;  Notable for the following components:      Result Value   CO2 21 (*)    Glucose, Bld 162 (*)    All other components within normal limits  CBC - Abnormal; Notable for the following components:   WBC 18.0 (*)    Hemoglobin 8.8 (*)    HCT 30.1 (*)    MCV 71.8 (*)    MCH 21.0 (*)    MCHC 29.2 (*)    RDW 17.3 (*)    All other components within normal limits  D-DIMER, QUANTITATIVE - Abnormal; Notable for the following components:   D-Dimer, Quant 1.20 (*)    All other components within normal limits  HEPATIC FUNCTION PANEL - Abnormal; Notable for the following components:   Albumin 3.2 (*)    All other components within normal limits  LIPASE, BLOOD  HCG, QUANTITATIVE, PREGNANCY  POC URINE PREG, ED  TROPONIN I (HIGH SENSITIVITY)  TROPONIN I (HIGH SENSITIVITY)    ____________________________________________  EKG: Sinus tachycardia, ventricular 106.  PR 136, QRS 78, QTc 459.  No acute ST elevation or depression in acute evidence of acute ischemia or infarct ________________________________________  RADIOLOGY All imaging, including plain films, CT scans, and ultrasounds, independently reviewed by me, and interpretations confirmed via formal radiology reads.  ED MD interpretation:   Chest x-ray: No acute disease CT Angio: Negative  Official radiology report(s): DG Chest 2 View  Result Date: 10/19/2020 CLINICAL DATA:  Substernal chest pain. EXAM: CHEST - 2 VIEW COMPARISON:  July 05, 2017 FINDINGS: Decreased lung volumes are seen which is likely secondary to the degree of patient inspiration. The heart size and mediastinal contours are within normal limits. Both lungs are clear. The visualized skeletal structures are unremarkable. IMPRESSION: No active cardiopulmonary disease. Electronically Signed   By: Virgina Norfolk M.D.   On: 10/19/2020 17:03   CT Angio Chest PE W and/or Wo Contrast  Result Date: 10/19/2020 CLINICAL DATA:  Substernal chest pain. Right chest tightness. Pain  worse with deep breaths. EXAM: CT ANGIOGRAPHY  CHEST WITH CONTRAST TECHNIQUE: Multidetector CT imaging of the chest was performed using the standard protocol during bolus administration of intravenous contrast. Multiplanar CT image reconstructions and MIPs were obtained to evaluate the vascular anatomy. CONTRAST:  143mL OMNIPAQUE IOHEXOL 350 MG/ML SOLN COMPARISON:  Radiograph earlier today. FINDINGS: Cardiovascular: There are no filling defects within the pulmonary arteries to suggest pulmonary embolus. No evidence of aortic dissection. 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 Heart is normal in size. No pericardial effusion. Mediastinum/Nodes: No enlarged mediastinal or hilar lymph nodes. Patulous distal esophagus with small hiatal hernia. No visualized thyroid nodule. Lungs/Pleura: Mild breathing motion artifact. No confluent consolidation. No findings of pulmonary edema. No pleural fluid. Upper Abdomen: Postsurgical change in the stomach. No acute upper abdominal findings. Musculoskeletal: There are no acute or suspicious osseous abnormalities. Review of the MIP images confirms the above findings. IMPRESSION: 1. No pulmonary embolus or acute intrathoracic abnormality. 2. 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 Electronically Signed   By: Keith Rake M.D.   On: 10/19/2020 19:53    ____________________________________________  PROCEDURES   Procedure(s) performed (including Critical Care):  Procedures  ____________________________________________  INITIAL IMPRESSION / MDM / [place] / ED COURSE  As part of my medical decision making, I reviewed the following data within the [place] notes reviewed and incorporated, Old chart reviewed, Notes from prior ED visits, and [place] Controlled Substance Database       *Ronnette Rump was evaluated in Emergency Department on 10/19/2020 for the symptoms  described in the history of present illness. She was evaluated in the context of the global COVID-19 pandemic, which necessitated consideration that the patient might be at risk for infection with the SARS-CoV-2 virus that causes COVID-19. Institutional protocols and algorithms that pertain to the evaluation of patients at risk for COVID-19 are in a state of rapid change based on information released by regulatory bodies including the CDC and federal and state organizations. These policies and algorithms were followed during the patient's care in the ED.  Some ED evaluations and interventions may be delayed as a result of limited staffing during the pandemic.*     Medical Decision Making: 35 year old female here with atypical chest pain.  Suspect possible GI related discomfort.  She had burning and a history of gastric sleeve.  Patient feels markedly improved after GI cocktail.  Lab work is very reassuring.  EKG nonischemic and troponins negative x2, do not suspect ACS.  She is mildly tachycardic so D-dimer sent and is positive, angio obtained, reviewed and is unremarkable.  Lab work otherwise reassuring.  She has some mild, likely chronic iron deficiency anemia.  Leukocytosis is nonspecific and chest x-ray shows no evidence of pneumonia.  Will DC with treatment for possible GI related pain, good return precautions and outpatient follow-up.  ____________________________________________  FINAL CLINICAL IMPRESSION(S) / ED DIAGNOSES  Final diagnoses:  Atypical chest pain  Nonspecific chest pain     MEDICATIONS GIVEN DURING THIS VISIT:  Medications  alum & mag hydroxide-simeth (MAALOX/MYLANTA) 200-200-20 MG/5ML suspension 30 mL (30 mLs Oral Given 10/19/20 1713)    And  lidocaine (XYLOCAINE) 2 % viscous mouth solution 15 mL (15 mLs Oral Given 10/19/20 1713)  morphine 4 MG/ML injection 4 mg (4 mg Intravenous Given 10/19/20 1713)  ondansetron (ZOFRAN) injection 4 mg (4 mg Intravenous Given 10/19/20 1713)   iohexol (OMNIPAQUE) 350 MG/ML injection 100 mL (100 mLs Intravenous Contrast Given 10/19/20 1935)  ED Discharge Orders         Ordered    pantoprazole (PROTONIX) 40 MG tablet  Daily        10/19/20 2007    sucralfate (CARAFATE) 1 g tablet  4 times daily        10/19/20 2007           Note:  This document was prepared using Dragon voice recognition software and may include unintentional dictation errors.   Duffy Bruce, MD 10/19/20 2120

## 2020-10-30 ENCOUNTER — Telehealth: Payer: Self-pay

## 2020-10-30 NOTE — Telephone Encounter (Signed)
Pt calling; was unable to get Saxenda; wants to talk to AMS regarding other weight loss measures.  [phone] Left detailed msg AMS is out of the office this week; if wants msg sent to someone else to call back otherwise AMS will get this msg next week.

## 2020-11-02 ENCOUNTER — Other Ambulatory Visit: Payer: Self-pay | Admitting: Plastic Surgery

## 2020-11-15 NOTE — Telephone Encounter (Signed)
See msg from 10/2020.

## 2020-11-17 NOTE — Telephone Encounter (Signed)
Patient is scheduled or 12/13/20

## 2020-11-17 NOTE — Telephone Encounter (Signed)
So if the ozempic and saxenda weren't approved then she need a follow up 3 months from her last visit to restart phentermine but 3 months off first

## 2020-12-13 ENCOUNTER — Encounter: Payer: Self-pay | Admitting: Obstetrics and Gynecology

## 2020-12-13 ENCOUNTER — Ambulatory Visit (INDEPENDENT_AMBULATORY_CARE_PROVIDER_SITE_OTHER): Payer: BC Managed Care – PPO | Admitting: Obstetrics and Gynecology

## 2020-12-13 ENCOUNTER — Other Ambulatory Visit: Payer: Self-pay

## 2020-12-13 VITALS — Ht 61.0 in | Wt 256.0 lb

## 2020-12-13 DIAGNOSIS — Z6841 Body Mass Index (BMI) 40.0 and over, adult: Secondary | ICD-10-CM | POA: Diagnosis not present

## 2020-12-13 MED ORDER — PHENTERMINE HCL 37.5 MG PO TABS: 37.5000 mg | ORAL_TABLET | Freq: Every day | ORAL | 0 refills | Status: DC

## 2020-12-13 NOTE — Progress Notes (Signed)
Gynecology Office Visit  Chief Complaint:  Chief Complaint  Patient presents with  . Follow-up    Weight check    History of Present Illness: Patientis a 35 y.o. G46P1011 female, who presents for the evaluation of weight gain. She has gained 5 pounds primarily over 4 months. The patient states the following issues have contributed to her weight problem: lifestyle.  The patient has no additional symptoms. The patient specifically denies memory loss, muscle weakness, excessive thirst, and polyuria. Weight related co-morbidities include none. The patient's past medical history is notable for none. She has tried phentermine interventions in the past with modest success. Did not get approval for Saxenda.    Review of Systems: 10 point review of systems negative unless otherwise noted in HPI  Past Medical History:  Patient Active Problem List   Diagnosis Date Noted  . Lymphedema 01/25/2020  . Pain in limb 12/21/2019  . Swelling of limb 12/21/2019  . S/P bilateral breast reduction 11/24/2019  . Macromastia 09/09/2019  . Upper back pain 09/09/2019  . Hx of fracture of tibia 09/09/2019  . History of bariatric surgery 05/09/2019  . History of syphilis 05/09/2019    Received bicillin x3 in 2013 (66, 6/11, 6/18) RPR Titer was 1:32 in 2013--> 1:16-->1:4  Range has been 1:1-1:4 since treatment.    Marland Kitchen Herpes genitalia 05/09/2019  . Morbid obesity with body mass index of 40.0-49.9 ([place]) 07/25/2015  . Well woman exam 01/03/2015  . OSA (obstructive sleep apnea) 08/02/2011    NPSG 08/2011:  AHI 20/hr, desat to 80% CPAP titration study 09/2013:  Optimal pressure 16cm   . Insomnia 08/02/2011  . Obesity 08/02/2011    Past Surgical History:  Past Surgical History:  Procedure Laterality Date  . BREAST REDUCTION SURGERY Bilateral 11/02/2019   Procedure: MAMMARY REDUCTION  (BREAST);  Surgeon: Cindra Presume, MD;  Location: [facility];  Service: Plastics;  Laterality: Bilateral;  3  hours  . FRACTURE SURGERY  2004   L left tibia  . LAPAROSCOPIC GASTRIC SLEEVE RESECTION N/A 07/25/2015   Procedure: LAPAROSCOPIC GASTRIC SLEEVE RESECTION;  Surgeon: Excell Seltzer, MD;  Location: [facility];  Service: General;  Laterality: N/A;    Gynecologic History: Patient's last menstrual period was 12/02/2020.  Obstetric History: G2P1011  Family History:  Family History  Problem Relation Age of Onset  . Hypertension Father   . Hypertension Maternal Grandmother   . Diabetes Maternal Grandfather   . Hypertension Maternal Grandfather   . Other Cousin        breast augmentation 2/2 to pain    Social History:  Social History   Socioeconomic History  . Marital status: Married    Spouse name: Kevon  . Number of children: 1  . Years of education: college  . Highest education level: Not on file  Occupational History  . Occupation: AR specialist- Sport and exercise psychologist: [employer] Use  . Smoking status: Never Smoker  . Smokeless tobacco: Never Used  Vaping Use  . Vaping Use: Never used  Substance and Sexual Activity  . Alcohol use: Yes    Alcohol/week: 0.0 standard drinks    Comment: not much now, just at holidays wine cooler  . Drug use: No  . Sexual activity: Yes    Birth control/protection: Inserts    Comment: Nuvaring  Other Topics Concern  . Not on file  Social History Narrative   09/09/19   From: [place] -  her mom moved her after [place] finished college   Living: with husband [name] and daughter [name]   Work: Medical illustrator - Insurance claims handler      Family: mom nearby      Enjoys: reading, spend time with daughter - swimming, dancing      Exercise: not currently, planning to start exercise plan with sister   Diet: intermittent fasting       Safety   Seat belts: Yes    Guns: No   Safe in relationships: Yes    Social Determinants of Radio broadcast assistant Strain: Not on file  Food Insecurity: Not on file   Transportation Needs: Not on file  Physical Activity: Not on file  Stress: Not on file  Social Connections: Not on file  Intimate Partner Violence: Not on file    Allergies:  No Known Allergies  Medications: Prior to Admission medications   Medication Sig Start Date End Date Taking? Authorizing Provider  amitriptyline (ELAVIL) 100 MG tablet Take 1 tablet (100 mg total) by mouth at bedtime as needed for sleep (and headache). For sleep and headache 05/01/20  Yes Gregor Hams, MD  Cyanocobalamin (VITAMIN B-12) 1000 MCG/15ML LIQD Take 15 mLs by mouth daily.   Yes [provider]  etonogestrel-ethinyl estradiol (NUVARING) 0.12-0.015 MG/24HR vaginal ring Insert vaginally and leave in place for 3 consecutive weeks, then remove for 1 week. 06/15/20  Yes [name], MD  triamcinolone cream (KENALOG) 0.1 % APPLY TO AFFECTED AREA TWICE A DAY 11/04/20  Yes Cindra Presume, MD  valACYclovir (VALTREX) 500 MG tablet Take 1 tablet (500 mg total) by mouth daily. As needed 07/17/20  Yes [name], MD  calcium citrate-vitamin D (CITRACAL+D) 315-200 MG-UNIT tablet Take 1 tablet by mouth daily.    [provider]  etonogestrel-ethinyl estradiol (NUVARING) 0.12-0.015 MG/24HR vaginal ring Insert vaginally and leave in place for 3 consecutive weeks, then remove for 1 week. 09/01/20   Imagene Riches, CNM  Insulin Pen Needle 32G X 6 MM MISC 1 Units by Does not apply route as directed. Patient not taking: Reported on 12/13/2020 09/08/20   [name], MD  Liraglutide -Weight Management (SAXENDA) 18 MG/3ML SOPN Inject 3 mg into the skin daily. Patient not taking: Reported on 12/13/2020 09/08/20   [name], MD  Multiple Vitamins-Minerals (HAIR SKIN NAILS PO) Take by mouth. Patient not taking: Reported on 12/13/2020    Joline Salt, RN  pantoprazole (PROTONIX) 40 MG tablet Take 1 tablet (40 mg total) by mouth daily for 14 days. 10/19/20 11/02/20  Duffy Bruce, MD  Pediatric  Multiple Vitamins (FLINTSTONES MULTIVITAMIN PO) Take 1 tablet by mouth daily. Patient not taking: Reported on 12/13/2020    [provider]  phentermine (ADIPEX-P) 37.5 MG tablet Take 1 tablet (37.5 mg total) by mouth daily before breakfast. Patient not taking: No sig reported 05/09/20   [name], MD  sucralfate (CARAFATE) 1 g tablet Take 1 tablet (1 g total) by mouth 4 (four) times daily for 7 days. 10/19/20 10/26/20  Duffy Bruce, MD    Physical Exam Height 5\' 1"  (1.549 m), weight 256 lb (116.1 kg), last menstrual period 12/02/2020. Patient's last menstrual period was 12/02/2020.  General: NAD HEENT: normocephalic, anicteric Thyroid: no enlargement Pulmonary: no increased work of breathing Neurologic: Grossly intact Psychiatric: mood appropriate, affect full  Assessment: 35 y.o. G2P1011 presenting for discussion of weight loss management options  Plan: Problem List Items Addressed This Visit  Other   Obesity - Primary   Relevant Medications   phentermine (ADIPEX-P) 37.5 MG tablet   Other Relevant Orders   Amb Referral to Bariatric Surgery      1) 1500 Calorie ADA Diet  2) Patient education given regarding appropriate lifestyle changes for weight loss including: regular physical activity, healthy coping strategies, caloric restriction and healthy eating patterns.  3) Patient will be started on weight loss medication. The risks and benefits and side effects of medication, such as Adipex (Phenteramine) ,  Tenuate (Diethylproprion),, Contrave (buproprion/naltrexone), Qsymia (phentermine/topiramate), and Saxenda (liraglutide) is discussed. The pros and cons of suppressing appetite and boosting metabolism is discussed. Risks of tolerence and addiction is discussed for selected agents discussed. Use of medicine will ne short term, such as 3-4 months at a time followed by a period of time off of the medicine to avoid these risks and side effects for Adipex, Qsymia,  and Tenuate discussed. Pt to call with any negative side effects and agrees to keep follow up appts. - restart phentermine - referral to bariatrics  4) Comorbidity Screening - hypothyroidism screening, diabetes, and hyperlipidemia screening offered  5) Encouraged weekly weight monitorig to track progress and sample 1 week food diary  6) 15 minutes face-to-face; counseling/coordination of care > 50 percent of visit  7) No follow-ups on file.   [name], MD, [facility], [facility] 12/13/2020, 4:41 PM

## 2020-12-25 ENCOUNTER — Other Ambulatory Visit: Payer: Self-pay | Admitting: Plastic Surgery

## 2020-12-26 NOTE — Telephone Encounter (Signed)
Called and spoke with the patient regarding refill request.  Informed the patient that I spoke with [facility], he stated that Dr. Claudia Desanctis mentioned in his last office note that he would plan to refer the patient to Dermatology for further evaluation.    Patient verbalized understanding and agreed.  Informed the patient that we will place the refer for her.  Patient agreed.//AB/CMA

## 2020-12-29 ENCOUNTER — Other Ambulatory Visit: Payer: Self-pay | Admitting: Surgical

## 2020-12-29 DIAGNOSIS — N62 Hypertrophy of breast: Secondary | ICD-10-CM

## 2021-01-05 ENCOUNTER — Emergency Department: Payer: BC Managed Care – PPO

## 2021-01-05 ENCOUNTER — Encounter: Payer: Self-pay | Admitting: Emergency Medicine

## 2021-01-05 ENCOUNTER — Other Ambulatory Visit: Payer: Self-pay

## 2021-01-05 ENCOUNTER — Emergency Department
Admission: EM | Admit: 2021-01-05 | Discharge: 2021-01-05 | Disposition: A | Discharge status: Home / Self Care | Payer: BC Managed Care – PPO | Attending: Emergency Medicine | Admitting: Emergency Medicine

## 2021-01-05 DIAGNOSIS — K8050 Calculus of bile duct without cholangitis or cholecystitis without obstruction: Secondary | ICD-10-CM | POA: Diagnosis not present

## 2021-01-05 DIAGNOSIS — R1011 Right upper quadrant pain: Secondary | ICD-10-CM

## 2021-01-05 LAB — URINALYSIS, COMPLETE (UACMP) WITH MICROSCOPIC
Bacteria, UA: NONE SEEN
Bilirubin Urine: NEGATIVE
Glucose, UA: NEGATIVE mg/dL
Hgb urine dipstick: NEGATIVE
Ketones, ur: NEGATIVE mg/dL
Nitrite: NEGATIVE
Protein, ur: 30 mg/dL — AB
Specific Gravity, Urine: 1.029 (ref 1.005–1.030)
pH: 6 (ref 5.0–8.0)

## 2021-01-05 LAB — COMPREHENSIVE METABOLIC PANEL
ALT: 47 U/L — ABNORMAL HIGH (ref 0–44)
AST: 70 U/L — ABNORMAL HIGH (ref 15–41)
Albumin: 3.7 g/dL (ref 3.5–5.0)
Alkaline Phosphatase: 110 U/L (ref 38–126)
Anion gap: 5 (ref 5–15)
BUN: 12 mg/dL (ref 6–20)
CO2: 24 mmol/L (ref 22–32)
Calcium: 9 mg/dL (ref 8.9–10.3)
Chloride: 106 mmol/L (ref 98–111)
Creatinine, Ser: 0.65 mg/dL (ref 0.44–1.00)
GFR, Estimated: >60 mL/min (ref >60)
Glucose, Bld: 126 mg/dL — ABNORMAL HIGH (ref 70–99)
Potassium: 3.8 mmol/L (ref 3.5–5.1)
Sodium: 135 mmol/L (ref 135–145)
Total Bilirubin: 0.9 mg/dL (ref 0.3–1.2)
Total Protein: 8.4 g/dL — ABNORMAL HIGH (ref 6.5–8.1)

## 2021-01-05 LAB — CBC WITH DIFFERENTIAL/PLATELET
Abs Immature Granulocytes: 0.06 10*3/uL (ref 0.00–0.07)
Basophils Absolute: 0 10*3/uL (ref 0.0–0.1)
Basophils Relative: 0 %
Eosinophils Absolute: 0 10*3/uL (ref 0.0–0.5)
Eosinophils Relative: 0 %
HCT: 30.2 % — ABNORMAL LOW (ref 36.0–46.0)
Hemoglobin: 8.8 g/dL — ABNORMAL LOW (ref 12.0–15.0)
Immature Granulocytes: 0 %
Lymphocytes Relative: 12 %
Lymphs Abs: 2.1 10*3/uL (ref 0.7–4.0)
MCH: 20.4 pg — ABNORMAL LOW (ref 26.0–34.0)
MCHC: 29.1 g/dL — ABNORMAL LOW (ref 30.0–36.0)
MCV: 69.9 fL — ABNORMAL LOW (ref 80.0–100.0)
Monocytes Absolute: 0.5 10*3/uL (ref 0.1–1.0)
Monocytes Relative: 3 %
Neutro Abs: 14.8 10*3/uL — ABNORMAL HIGH (ref 1.7–7.7)
Neutrophils Relative %: 85 %
Platelets: 346 10*3/uL (ref 150–400)
RBC: 4.32 MIL/uL (ref 3.87–5.11)
RDW: 18.7 % — ABNORMAL HIGH (ref 11.5–15.5)
Smear Review: NORMAL
WBC: 17.5 10*3/uL — ABNORMAL HIGH (ref 4.0–10.5)
nRBC: 0 % (ref 0.0–0.2)

## 2021-01-05 LAB — LIPASE, BLOOD: Lipase: 24 U/L (ref 11–51)

## 2021-01-05 LAB — POC URINE PREG, ED: Preg Test, Ur: NEGATIVE

## 2021-01-05 MED ORDER — ONDANSETRON 4 MG PO TBDP: 4.0000 mg | ORAL_TABLET | Freq: Three times a day (TID) | ORAL | 0 refills | Status: DC | PRN

## 2021-01-05 MED ORDER — OXYCODONE-ACETAMINOPHEN 5-325 MG PO TABS: 1.0000 | ORAL_TABLET | ORAL | 0 refills | Status: DC | PRN

## 2021-01-05 MED ORDER — MORPHINE SULFATE (PF) 4 MG/ML IV SOLN
4.0000 mg | Freq: Once | INTRAVENOUS | Status: AC
  Administered 2021-01-05: 4 mg via INTRAVENOUS
  Filled 2021-01-05: qty 1

## 2021-01-05 MED ORDER — LACTATED RINGERS IV BOLUS
1000.0000 mL | Freq: Once | INTRAVENOUS | Status: AC
  Administered 2021-01-05: 1000 mL via INTRAVENOUS

## 2021-01-05 MED ORDER — ONDANSETRON HCL 4 MG/2ML IJ SOLN
4.0000 mg | Freq: Once | INTRAMUSCULAR | Status: AC
  Administered 2021-01-05: 4 mg via INTRAVENOUS
  Filled 2021-01-05: qty 2

## 2021-01-05 NOTE — ED Notes (Signed)
[place] at bedside, states pain improved rates it 1/10 at this time.

## 2021-01-05 NOTE — ED Provider Notes (Signed)
[facility] Emergency Department Provider Note   ____________________________________________   Event Date/Time   First MD Initiated Contact with Patient 01/05/21 [phone]     (approximate)  I have reviewed the triage vital signs and the nursing notes.   HISTORY  Chief Complaint Abdominal Pain    HPI Nancy Owen is a 35 y.o. female with past medical history of GERD and sleeve gastrectomy who presents to the ED complaining of abdominal pain.  Patient reports that she has had gradually worsening pain in the right upper quadrant of her abdomen since yesterday afternoon.  She describes the pain as sharp and constant, not exacerbated or alleviated by anything in particular.  It is been associated with nausea and a couple episodes of vomiting, but she denies any changes in her bowel movements.  She had sleeve gastrectomy performed in 2007 and denies any issues with her stomach since then.  She does state that she has been taking ibuprofen for various pains recently, was told by her OB/GYN that she might have an ulcer.  She still has both her gallbladder and her appendix.        Past Medical History:  Diagnosis Date   Allergic rhinitis    Encounter for insertion of mirena IUD    GERD (gastroesophageal reflux disease)    Gonorrhea    Herpes genitalia    High-risk pregnancy 01/17/2014   chorio/mild utering atony   History of hiatal hernia    History of syphilis    Obesity    Sleep apnea    Syphilis     Patient Active Problem List   Diagnosis Date Noted   Lymphedema 01/25/2020   Pain in limb 12/21/2019   Swelling of limb 12/21/2019   S/P bilateral breast reduction 11/24/2019   Macromastia 09/09/2019   Upper back pain 09/09/2019   Hx of fracture of tibia 09/09/2019   History of bariatric surgery 05/09/2019   History of syphilis 05/09/2019   Herpes genitalia 05/09/2019   Morbid obesity with body mass index of 40.0-49.9 ([place]) 07/25/2015   Well woman  exam 01/03/2015   OSA (obstructive sleep apnea) 08/02/2011   Insomnia 08/02/2011   Obesity 08/02/2011    Past Surgical History:  Procedure Laterality Date   BREAST REDUCTION SURGERY Bilateral 11/02/2019   Procedure: MAMMARY REDUCTION  (BREAST);  Surgeon: Cindra Presume, MD;  Location: [facility];  Service: Plastics;  Laterality: Bilateral;  3 hours   FRACTURE SURGERY  2004   L left tibia   LAPAROSCOPIC GASTRIC SLEEVE RESECTION N/A 07/25/2015   Procedure: LAPAROSCOPIC GASTRIC SLEEVE RESECTION;  Surgeon: Excell Seltzer, MD;  Location: [facility];  Service: General;  Laterality: N/A;    Prior to Admission medications   Medication Sig Start Date End Date Taking? Authorizing Provider  ondansetron (ZOFRAN ODT) 4 MG disintegrating tablet Take 1 tablet (4 mg total) by mouth every 8 (eight) hours as needed for nausea or vomiting. 01/05/21  Yes Blake Divine, MD  oxyCODONE-acetaminophen (PERCOCET) 5-325 MG tablet Take 1 tablet by mouth every 4 (four) hours as needed for severe pain. 01/05/21 01/05/22 Yes Blake Divine, MD  amitriptyline (ELAVIL) 100 MG tablet Take 1 tablet (100 mg total) by mouth at bedtime as needed for sleep (and headache). For sleep and headache 05/01/20   Gregor Hams, MD  calcium citrate-vitamin D (CITRACAL+D) 315-200 MG-UNIT tablet Take 1 tablet by mouth daily.    [provider]  Cyanocobalamin (VITAMIN B-12) 1000 MCG/15ML LIQD Take 15  mLs by mouth daily.    [provider]  etonogestrel-ethinyl estradiol (NUVARING) 0.12-0.015 MG/24HR vaginal ring Insert vaginally and leave in place for 3 consecutive weeks, then remove for 1 week. 06/15/20   [name], MD  etonogestrel-ethinyl estradiol (NUVARING) 0.12-0.015 MG/24HR vaginal ring Insert vaginally and leave in place for 3 consecutive weeks, then remove for 1 week. 09/01/20   Imagene Riches, CNM  Insulin Pen Needle 32G X 6 MM MISC 1 Units by Does not apply route as directed. Patient not  taking: Reported on 12/13/2020 09/08/20   [name], MD  Liraglutide -Weight Management (SAXENDA) 18 MG/3ML SOPN Inject 3 mg into the skin daily. Patient not taking: Reported on 12/13/2020 09/08/20   [name], MD  Multiple Vitamins-Minerals (HAIR SKIN NAILS PO) Take by mouth. Patient not taking: Reported on 12/13/2020    Joline Salt, RN  pantoprazole (PROTONIX) 40 MG tablet Take 1 tablet (40 mg total) by mouth daily for 14 days. 10/19/20 11/02/20  Duffy Bruce, MD  Pediatric Multiple Vitamins (FLINTSTONES MULTIVITAMIN PO) Take 1 tablet by mouth daily. Patient not taking: Reported on 12/13/2020    [provider]  phentermine (ADIPEX-P) 37.5 MG tablet Take 1 tablet (37.5 mg total) by mouth daily before breakfast. 12/13/20   [name], MD  sucralfate (CARAFATE) 1 g tablet Take 1 tablet (1 g total) by mouth 4 (four) times daily for 7 days. 10/19/20 10/26/20  Duffy Bruce, MD  triamcinolone cream (KENALOG) 0.1 % APPLY TO AFFECTED AREA TWICE A DAY 11/04/20   Cindra Presume, MD  valACYclovir (VALTREX) 500 MG tablet Take 1 tablet (500 mg total) by mouth daily. As needed 07/17/20   [name], MD    Allergies Patient has no known allergies.  Family History  Problem Relation Age of Onset   Hypertension Father    Hypertension Maternal Grandmother    Diabetes Maternal Grandfather    Hypertension Maternal Grandfather    Other Cousin        breast augmentation 2/2 to pain    Social History Social History   Tobacco Use   Smoking status: Never   Smokeless tobacco: Never  Vaping Use   Vaping Use: Never used  Substance Use Topics   Alcohol use: Yes    Alcohol/week: 0.0 standard drinks    Comment: not much now, just at holidays wine cooler   Drug use: No    Review of Systems  Constitutional: No fever/chills Eyes: No visual changes. ENT: No sore throat. Cardiovascular: Denies chest pain. Respiratory: Denies shortness of breath. Gastrointestinal: Positive  for abdominal pain, nausea, and vomiting.  No diarrhea.  No constipation. Genitourinary: Negative for dysuria. Musculoskeletal: Negative for back pain. Skin: Negative for rash. Neurological: Negative for headaches, focal weakness or numbness.  ____________________________________________   PHYSICAL EXAM:  VITAL SIGNS: ED Triage Vitals  Enc Vitals Group     BP 01/05/21 0246 112/74     Pulse Rate 01/05/21 0246 (!) 105     Resp 01/05/21 0246 18     Temp 01/05/21 0246 (!) 97.5 F (36.4 C)     Temp Source 01/05/21 0246 Oral     SpO2 01/05/21 0246 97 %     Weight 01/05/21 0247 245 lb (111.1 kg)     Height 01/05/21 0247 5\' 1"  (1.549 m)     Head Circumference --      Peak Flow --      Pain Score 01/05/21 0247 7     Pain Loc --  Pain Edu? --      Excl. in [place]? --     Constitutional: Alert and oriented. Eyes: Conjunctivae are normal. Head: Atraumatic. Nose: No congestion/rhinnorhea. Mouth/Throat: Mucous membranes are moist. Neck: Normal ROM Cardiovascular: Normal rate, regular rhythm. Grossly normal heart sounds. Respiratory: Normal respiratory effort.  No retractions. Lungs CTAB. Gastrointestinal: Soft and tender to palpation in the right upper quadrant with no rebound or guarding. No distention. Genitourinary: deferred Musculoskeletal: No lower extremity tenderness nor edema. Neurologic:  Normal speech and language. No gross focal neurologic deficits are appreciated. Skin:  Skin is warm, dry and intact. No rash noted. Psychiatric: Mood and affect are normal. Speech and behavior are normal.  ____________________________________________   LABS (all labs ordered are listed, but only abnormal results are displayed)  Labs Reviewed  CBC WITH DIFFERENTIAL/PLATELET - Abnormal; Notable for the following components:      Result Value   WBC 17.5 (*)    Hemoglobin 8.8 (*)    HCT 30.2 (*)    MCV 69.9 (*)    MCH 20.4 (*)    MCHC 29.1 (*)    RDW 18.7 (*)    Neutro Abs 14.8  (*)    All other components within normal limits  COMPREHENSIVE METABOLIC PANEL - Abnormal; Notable for the following components:   Glucose, Bld 126 (*)    Total Protein 8.4 (*)    AST 70 (*)    ALT 47 (*)    All other components within normal limits  URINALYSIS, COMPLETE (UACMP) WITH MICROSCOPIC - Abnormal; Notable for the following components:   Color, Urine AMBER (*)    APPearance HAZY (*)    Protein, ur 30 (*)    Leukocytes,Ua TRACE (*)    All other components within normal limits  LIPASE, BLOOD  POC URINE PREG, ED    PROCEDURES  Procedure(s) performed (including Critical Care):  Procedures   ____________________________________________   INITIAL IMPRESSION / ASSESSMENT AND PLAN / ED COURSE      35 year old female with past medical history of GERD and sleeve gastrectomy who presents to the ED complaining of gradually worsening abdominal pain since yesterday afternoon associated with nausea and vomiting.  Patient has focal tenderness in the right upper quadrant of her abdomen and we will further assess for gallstones or other biliary pathology with right upper quadrant ultrasound.  Labs are thus far reassuring, significant for mild transaminitis but bilirubin within normal limits and I doubt choledocholithiasis.  UA and pregnancy testing are pending, we will treat symptomatically with IV morphine and Zofran.  Pregnancy testing is negative, UA shows no signs of infection.  Right upper quadrant ultrasound shows gallstones but no signs of cholecystitis and CBD within normal limits.  Patient reports pain and nausea are much improved.  She is appropriate for discharge home with general surgery follow-up, suspect her symptoms are related to biliary colic.  She was counseled to return to the ED for new worsening symptoms, patient agrees with plan.      ____________________________________________   FINAL CLINICAL IMPRESSION(S) / ED DIAGNOSES  Final diagnoses:  RUQ pain   Biliary colic     ED Discharge Orders          Ordered    oxyCODONE-acetaminophen (PERCOCET) 5-325 MG tablet  Every 4 hours PRN        01/05/21 0716    ondansetron (ZOFRAN ODT) 4 MG disintegrating tablet  Every 8 hours PRN        01/05/21 0716  Note:  This document was prepared using Dragon voice recognition software and may include unintentional dictation errors.    Blake Divine, MD 01/05/21 [phone]

## 2021-01-05 NOTE — ED Triage Notes (Addendum)
Patient ambulatory to triage with steady gait, without difficulty or distress noted; pt reports rt sided abd pain since 9pm accomp by nausea and feels as if she has an ulcer

## 2021-01-05 NOTE — ED Notes (Signed)
ED dispo pad unavailable, not working

## 2021-01-05 NOTE — ED Notes (Signed)
Pt in with co right sided abd pain that started yesterday. Denies any hx of the same. Pt states she vomited x 1. Has not had diarrhea, did have BM after taking Murelax.

## 2021-01-08 ENCOUNTER — Ambulatory Visit (INDEPENDENT_AMBULATORY_CARE_PROVIDER_SITE_OTHER): Payer: BC Managed Care – PPO | Admitting: Surgery

## 2021-01-08 ENCOUNTER — Encounter: Payer: Self-pay | Admitting: Surgery

## 2021-01-08 ENCOUNTER — Other Ambulatory Visit: Payer: Self-pay

## 2021-01-08 VITALS — BP 129/87 | HR 103 | Temp 98.0°F | Ht 61.0 in | Wt 252.0 lb

## 2021-01-08 DIAGNOSIS — K8020 Calculus of gallbladder without cholecystitis without obstruction: Secondary | ICD-10-CM | POA: Diagnosis not present

## 2021-01-08 NOTE — Patient Instructions (Addendum)
Our surgery scheduler will call you within 24-48 hours to schedule your surgery. Please have the [facility] surgery sheet available when speaking with her.   Gallbladder Eating Plan If you have a gallbladder condition, you may have trouble digesting fats. Eating a low-fat diet can help reduce your symptoms, and may be helpful before and after having surgery to remove your gallbladder (cholecystectomy). Your health care provider may recommend that you work with a diet and nutrition specialist (dietitian) to help you reduce the amount of fat in your diet. What are tips for following this plan? General guidelines Limit your fat intake to less than 30% of your total daily calories. If you eat around 1,800 calories each day, this is less than 60 grams (g) of fat per day. Fat is an important part of a healthy diet. Eating a low-fat diet can make it hard to maintain a healthy body weight. Ask your dietitian how much fat, calories, and other nutrients you need each day. Eat small, frequent meals throughout the day instead of three large meals. Drink at least 8-10 cups of fluid a day. Drink enough fluid to keep your urine clear or pale yellow. Limit alcohol intake to no more than 1 drink a day for nonpregnant women and 2 drinks a day for men. One drink equals 12 oz of beer, 5 oz of wine, or 1 oz of hard liquor. Reading food labels  Check Nutrition Facts on food labels for the amount of fat per serving. Choose foods with less than 3 grams of fat per serving.  Shopping Choose nonfat and low-fat healthy foods. Look for the words "nonfat," "low fat," or "fat free." Avoid buying processed or prepackaged foods. Cooking Cook using low-fat methods, such as baking, broiling, grilling, or boiling. Cook with small amounts of healthy fats, such as olive oil, grapeseed oil, canola oil, or sunflower oil. What foods are recommended? All fresh, frozen, or canned fruits and vegetables. Whole grains. Low-fat or non-fat  (skim) milk and yogurt. Lean meat, skinless poultry, fish, eggs, and beans. Low-fat protein supplement powders or drinks. Spices and herbs. What foods are not recommended? High-fat foods. These include baked goods, fast food, fatty cuts of meat, ice cream, french toast, sweet rolls, pizza, cheese bread, foods covered with butter, creamy sauces, or cheese. Fried foods. These include french fries, tempura, battered fish, breaded chicken, fried breads, and sweets. Foods with strong odors. Foods that cause bloating and gas. Summary A low-fat diet can be helpful if you have a gallbladder condition, or before and after gallbladder surgery. Limit your fat intake to less than 30% of your total daily calories. This is about 60 g of fat if you eat 1,800 calories each day. Eat small, frequent meals throughout the day instead of three large meals. This information is not intended to replace advice given to you by your health care provider. Make sure you discuss any questions you have with your healthcare provider. Document Revised: 02/17/2020 Document Reviewed: 02/17/2020 Elsevier Patient Education  2022 [place]. Cholelithiasis  Cholelithiasis happens when gallstones form in the gallbladder. The gallbladder stores bile. Bile is a fluid that helps digest fats. Bile can harden and form into gallstones. If they cause a blockage, they can cause pain (gallbladder attack). What are the causes? This condition may be caused by: Some blood diseases, such as sickle cell anemia. Too much of a fat-like substance (cholesterol) in your bile. Not enough bile salts in your bile. These salts help the body absorb and  digest fats. The gallbladder not emptying fully or often enough. This is common in pregnant women. What increases the risk? The following factors may make you more likely to develop this condition: Being female. Being pregnant many times. Eating a lot of fried foods, fat, and refined carbs (refined  carbohydrates). Being very overweight (obese). Being older than age 62. Using medicines with female hormones in them for a long time. Losing weight fast. Having gallstones in your family. Having some health problems, such as diabetes, Crohn's disease, or liver disease. What are the signs or symptoms? Often, there may be gallstones but no symptoms. These gallstones are called silent gallstones. If a gallstone causes a blockage, you may get sudden pain. The pain: Can be in the upper right part of your belly (abdomen). Normally comes at night or after you eat. Can last an hour or more. Can spread to your right shoulder, back, or chest. Can feel like discomfort, burning, or fullness in the upper part of your belly (indigestion). If the blockage lasts more than a few hours, you can get an infection or swelling. You may: Feel like you may vomit. Vomit. Feel bloated. Have belly pain for 5 hours or more. Feel tender in your belly, often in the upper right part and under your ribs. Have fever or chills. Have skin or the white parts of your eyes turn yellow (jaundice). Have dark pee (urine) or pale poop (stool). How is this treated? Treatment for this condition depends on how bad you feel. If you have symptoms, you may need: Home care, if symptoms are not very bad. Do not eat for 12-24 hours. Drink only water and clear liquids. Start to eat simple or clear foods after 1 or 2 days. Try broths and crackers. You may need medicines for pain or stomach upset or both. If you have an infection, you will need antibiotics. A hospital stay, if you have very bad pain or a very bad infection. Surgery to remove your gallbladder. You may need this if: Gallstones keep coming back. You have very bad symptoms. Medicines to break up gallstones. Medicines: Are best for small gallstones. May be used for up to 6-12 months. A procedure to find and take out gallstones or to break up gallstones. Follow these  instructions at home: Medicines Take over-the-counter and prescription medicines only as told by your doctor. If you were prescribed an antibiotic medicine, take it as told by your doctor. Do not stop taking the antibiotic even if you start to feel better. Ask your doctor if the medicine prescribed to you requires you to avoid driving or using machinery. Eating and drinking Drink enough fluid to keep your urine pale yellow. Drink water or clear fluids. This is important when you have pain. Eat healthy foods. Choose: Fewer fatty foods, such as fried foods. Fewer refined carbs. Avoid breads and grains that are highly processed, such as white bread and white rice. Choose whole grains, such as whole-wheat bread and brown rice. More fiber. Almonds, fresh fruit, and beans are healthy sources. General instructions Keep a healthy weight. Keep all follow-up visits as told by your doctor. This is important. Where to find more information [employer] of Diabetes and Digestive and Kidney Diseases: [email or web address] Contact a doctor if: You have sudden pain in the upper right part of your belly. Pain might spread to your right shoulder, back, or chest. You have been diagnosed with gallstones that have no symptoms and you get: Belly pain. Discomfort, burning, or  fullness in the upper part of your abdomen. You have dark urine or pale stools. Get help right away if: You have sudden pain in the upper right part of your abdomen, and the pain lasts more than 2 hours. You have pain in your abdomen, and: It lasts more than 5 hours. It keeps getting worse. You have a fever or chills. You keep feeling like you may vomit. You keep vomiting. Your skin or the white parts of your eyes turn yellow. Summary Cholelithiasis happens when gallstones form in the gallbladder. This condition may be caused by a blood disease, too much of a fat-like substance in the bile, or not enough bile salts in  bile. Treatment for this condition depends on how bad you feel. If you have symptoms, do not eat or drink. You may need medicines. You may need a hospital stay for very bad pain or a very bad infection. You may need surgery if gallstones keep coming back or if you have very bad symptoms. This information is not intended to replace advice given to you by your health care provider. Make sure you discuss any questions you have with your healthcare provider. Document Revised: 08/20/2019 Document Reviewed: 05/24/2019 Elsevier Patient Education  2022 [place]. Minimally Invasive Cholecystectomy Minimally invasive cholecystectomy is surgery to remove the gallbladder. The gallbladder is a pear-shaped organ that lies beneath the liver on the right side of the body. The gallbladder stores bile, which is a fluid that helps the body digest fats. Cholecystectomy is often done to treat inflammation of the gallbladder (cholecystitis). This condition is usually caused by a buildup of gallstones (cholelithiasis) in the gallbladder. Gallstones can block the flow of bile, which can resultin inflammation and pain. In severe cases, emergency surgery may be required. This procedure is done though small incisions in the abdomen, instead of one large incision. It is also called laparoscopic surgery. A thin scope with a camera (laparoscope) is inserted through one incision. Then surgical instruments are inserted through the other incisions. In some cases, a minimally invasive surgery may need to be changed to a surgery that is done through a larger incision. This iscalled open surgery. Tell a health care provider about: Any allergies you have. All medicines you are taking, including vitamins, herbs, eye drops, creams, and over-the-counter medicines. Any problems you or family members have had with anesthetic medicines. Any blood disorders you have. Any surgeries you have had. Any medical conditions you have. Whether  you are pregnant or may be pregnant. What are the risks? Generally, this is a safe procedure. However, problems may occur, including: Infection. Bleeding. Allergic reactions to medicines. Damage to nearby structures or organs. A stone remaining in the common bile duct. The common bile duct carries bile from the gallbladder into the small intestine. A bile leak from the cyst duct that is clipped when your gallbladder is removed. What happens before the procedure? Staying hydrated Follow instructions from your health care provider about hydration, which may include: Up to 2 hours before the procedure - you may continue to drink clear liquids, such as water, clear fruit juice, black coffee, and plain tea.  Eating and drinking restrictions Follow instructions from your health care provider about eating and drinking, which may include: 8 hours before the procedure - stop eating heavy meals or foods, such as meat, fried foods, or fatty foods. 6 hours before the procedure - stop eating light meals or foods, such as toast or cereal. 6 hours before the procedure -  stop drinking milk or drinks that contain milk. 2 hours before the procedure - stop drinking clear liquids. Medicines Ask your health care provider about: Changing or stopping your regular medicines. This is especially important if you are taking diabetes medicines or blood thinners. Taking medicines such as aspirin and ibuprofen. These medicines can thin your blood. Do not take these medicines unless your health care provider tells you to take them. Taking over-the-counter medicines, vitamins, herbs, and supplements. General instructions Let your health care provider know if you develop a cold or an infection before surgery. Plan to have someone take you home from the hospital or clinic. If you will be going home right after the procedure, plan to have someone with you for 24 hours. Ask your health care provider: How your surgery site  will be marked. What steps will be taken to help prevent infection. These may include: Removing hair at the surgery site. Washing skin with a germ-killing soap. Taking antibiotic medicine. What happens during the procedure?  An IV will be inserted into one of your veins. You will be given one or both of the following: A medicine to help you relax (sedative). A medicine to make you fall asleep (general anesthetic). A breathing tube will be placed in your mouth. Your surgeon will make several small incisions in your abdomen. The laparoscope will be inserted through one of the small incisions. The camera on the laparoscope will send images to a monitor in the operating room. This lets your surgeon see inside your abdomen. A gas will be pumped into your abdomen. This will expand your abdomen to give the surgeon more room to perform the surgery. Other tools that are needed for the procedure will be inserted through the other incisions. The gallbladder will be removed through one of the incisions. Your common bile duct may be examined. If stones are found in the common bile duct, they may be removed. After your gallbladder has been removed, the incisions will be closed with stitches (sutures), staples, or skin glue. Your incisions may be covered with a bandage (dressing). The procedure may vary among health care providers and hospitals. What happens after the procedure? Your blood pressure, heart rate, breathing rate, and blood oxygen level will be monitored until you leave the hospital or clinic. You will be given medicines as needed to control your pain. If you were given a sedative during the procedure, it can affect you for several hours. Do not drive or operate machinery until your health care provider says that it is safe. Summary Minimally invasive cholecystectomy, also called laparoscopic cholecystectomy, is surgery to remove the gallbladder using small incisions. Tell your health care  provider about all the medical conditions you have and all the medicines you are taking for those conditions. Before the procedure, follow instructions about eating or drinking restrictions and changing or stopping medicines. If you were given a sedative during the procedure, it can affect you for several hours. Do not drive or operate machinery until your health care provider says that it is safe. This information is not intended to replace advice given to you by your health care provider. Make sure you discuss any questions you have with your healthcare provider. Document Revised: 04/05/2019 Document Reviewed: 04/05/2019 Elsevier Patient Education  [place].

## 2021-01-08 NOTE — H&P (View-Only) (Signed)
01/08/2021  Reason for Visit:  Symptomatic cholelithiasis  History of Present Illness: Nancy Owen is a 35 y.o. female presenting for evaluation of symptomatic cholelithiasis.  Patient presented to the ED on early morning of 6/24 with RUQ abdominal pain, associated with nausea and vomiting.  In the ER, her workup included labwork and U/S.  Her labs showed a WBC of 17.5, AST 70, ALT 47, but total bili of 0.9 and alk phos of 110.  Her U/S showed multiple gallstones but without evidence of acute cholecystitis.  Her pain resolved in the ER and she was discharged home with close follow up.    The patient reports that her pain started the night of 6/23 and she tried drinking Ginger tea which did not help.  Then she tried MiraLax and she had a bowel movement but the pain persisted.  After that, her mother recommended that she come to the ER for further evaluation.  The patient reports having prior episodes in the past which were less severe, and did not know about her gallbladder until this last visit.  She had an ER visit on 10/19/20 but her pain was more in the chest area, and CTA chest was negative.  Currently denies any fevers, chills, chest pain, shortness of breath, nausea, vomiting.  She has been watching her diet which has helped.  She did have some milder pain on 6/25 after having milk with her cereal, but has not had issues since.  Past Medical History: Past Medical History:  Diagnosis Date   Allergic rhinitis    Encounter for insertion of mirena IUD    GERD (gastroesophageal reflux disease)    Gonorrhea    Herpes genitalia    High-risk pregnancy 01/17/2014   chorio/mild utering atony   History of hiatal hernia    History of syphilis    Obesity    Sleep apnea    Syphilis      Past Surgical History: Past Surgical History:  Procedure Laterality Date   BREAST REDUCTION SURGERY Bilateral 11/02/2019   Procedure: MAMMARY REDUCTION  (BREAST);  Surgeon: Cindra Presume, MD;  Location:  [facility];  Service: Plastics;  Laterality: Bilateral;  3 hours   FRACTURE SURGERY  2004   L left tibia   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 Authorizing Provider  amitriptyline (ELAVIL) 100 MG tablet Take 1 tablet (100 mg total) by mouth at bedtime as needed for sleep (and headache). For sleep and headache 05/01/20  Yes Gregor Hams, MD  etonogestrel-ethinyl estradiol (NUVARING) 0.12-0.015 MG/24HR vaginal ring Insert vaginally and leave in place for 3 consecutive weeks, then remove for 1 week. 09/01/20  Yes Imagene Riches, CNM  Insulin Pen Needle 32G X 6 MM MISC 1 Units by Does not apply route as directed. 09/08/20  Yes [name], MD  Liraglutide -Weight Management (SAXENDA) 18 MG/3ML SOPN Inject 3 mg into the skin daily. 09/08/20  Yes [name], MD  Multiple Vitamins-Minerals (HAIR SKIN NAILS PO) Take by mouth.   Yes Joline Salt, RN  oxyCODONE-acetaminophen (PERCOCET) 5-325 MG tablet Take 1 tablet by mouth every 4 (four) hours as needed for severe pain. 01/05/21 01/05/22 Yes Blake Divine, MD  phentermine (ADIPEX-P)  37.5 MG tablet Take 1 tablet (37.5 mg total) by mouth daily before breakfast. 12/13/20  Yes [name], MD  triamcinolone cream (KENALOG) 0.1 % APPLY TO AFFECTED AREA TWICE A DAY 11/04/20  Yes Cindra Presume, MD  valACYclovir (VALTREX) 500 MG tablet Take 1 tablet (500 mg total) by mouth daily. As needed 07/17/20  Yes [name], MD  calcium citrate-vitamin D (CITRACAL+D) 315-200 MG-UNIT tablet Take 1 tablet by mouth daily.    [provider]  Cyanocobalamin (VITAMIN B-12) 1000 MCG/15ML LIQD Take 15 mLs by mouth daily.    [provider]  pantoprazole (PROTONIX) 40 MG tablet Take 1 tablet (40 mg total) by mouth daily for 14 days. 10/19/20 11/02/20  Duffy Bruce, MD  Pediatric Multiple Vitamins (FLINTSTONES MULTIVITAMIN PO) Take 1 tablet by mouth daily. Patient not taking: Reported on 12/13/2020    [provider]  sucralfate (CARAFATE) 1 g tablet Take 1 tablet (1 g total) by mouth 4 (four) times daily for 7 days. 10/19/20 10/26/20  Duffy Bruce, MD    Allergies: No Known Allergies  Social History:  reports that she has never smoked. She has never used smokeless tobacco. She reports current alcohol use. She reports that she does not use drugs.   Family History: Family History  Problem Relation Age of Onset   Hypertension Father    Hypertension Maternal Grandmother    Diabetes Maternal Grandfather    Hypertension Maternal Grandfather    Other Cousin        breast augmentation 2/2 to pain    Review of Systems: Review of Systems  Constitutional:  Negative for chills and fever.  HENT:  Negative for hearing loss.   Respiratory:  Negative for shortness of breath.   Cardiovascular:  Negative for chest pain.  Gastrointestinal:  Positive for abdominal pain, nausea and vomiting. Negative for constipation and diarrhea.  Genitourinary:  Negative for dysuria.  Musculoskeletal:  Negative for myalgias.  Skin:  Negative for rash.  Neurological:  Negative for dizziness.  Psychiatric/Behavioral:  Negative for depression.    Physical Exam BP 129/87   Pulse (!) 103   Temp 98 F (36.7 C) (Oral)   Ht _0  (1.549 m)   Wt 252 lb (114.3 kg)   LMP 12/20/2020   SpO2 98%   BMI 47.61 kg/m  CONSTITUTIONAL: No acute distress HEENT:  Normocephalic, atraumatic, extraocular motion intact. NECK: Trachea is midline, and there is no jugular venous distension.  RESPIRATORY:  Lungs are clear, and breath sounds are equal bilaterally. Normal respiratory effort without pathologic use of accessory  muscles. CARDIOVASCULAR: Heart is regular without murmurs, gallops, or rubs. GI: The abdomen is soft, obese, non-distended, with some discomfort to palpation in the RUQ.  Negative Murphy's sign. 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 MUSCULOSKELETAL:  Normal muscle strength and tone in all four extremities.  No peripheral edema or cyanosis. SKIN: Skin turgor is normal. There are no pathologic skin lesions.  NEUROLOGIC:  Motor and sensation is grossly normal.  Cranial nerves are grossly intact. PSYCH:  Alert and oriented to person, place and time. Affect is normal.  Laboratory Analysis: Labs from 01/06/21: Na 135, K 3.8, Cl 106, CO2 24, BUN 12, Cr 0.65.  Total bili 0.9, AST 70, ALT 47, Alk Phos 110, lipase 24.  WBC 17.5, Hgb 8.8, Hct 30.2, Plt 346.  Imaging: U/S RUQ 01/06/21: IMPRESSION: Cholelithiasis with no findings of acute cholecystitis or choledocholithiasis.  Assessment and Plan: This is a 35  y.o. female with symptomatic cholelithiasis.  --Discussed with the patient the role of the gallbladder and how the gallstones are affecting her pain and symptoms.  Discussed the possibility of conservative measures with low fat diet, but that this would not be a guarantee that her symptoms would not recur.  She's very interested in surgical intervention instead.  Discussed the role for robotic assisted cholecystectomy and reviewed the surgery at length, including the risks of bleeding, infection, and injury to surrounding structures, post-op recovery and restrictions, pain control, and that this is an outpatient surgery.  She's willing to proceed. --Will schedule the patient for surgery on 01/25/21.  She will coordinate with her job and family and will call us if she needs to change the OR date.  Face-to-face time spent with the patient and care providers was 60 minutes, with more than 50% of the time spent counseling, educating, and coordinating care of the patient.     Melvyn Neth, [facility]

## 2021-01-08 NOTE — Progress Notes (Signed)
01/08/2021  Reason for Visit:  Symptomatic cholelithiasis  History of Present Illness: Nancy Owen is a 35 y.o. female presenting for evaluation of symptomatic cholelithiasis.  Patient presented to the ED on early morning of 6/24 with RUQ abdominal pain, associated with nausea and vomiting.  In the ER, her workup included labwork and U/S.  Her labs showed a WBC of 17.5, AST 70, ALT 47, but total bili of 0.9 and alk phos of 110.  Her U/S showed multiple gallstones but without evidence of acute cholecystitis.  Her pain resolved in the ER and she was discharged home with close follow up.    The patient reports that her pain started the night of 6/23 and she tried drinking Ginger tea which did not help.  Then she tried MiraLax and she had a bowel movement but the pain persisted.  After that, her mother recommended that she come to the ER for further evaluation.  The patient reports having prior episodes in the past which were less severe, and did not know about her gallbladder until this last visit.  She had an ER visit on 10/19/20 but her pain was more in the chest area, and CTA chest was negative.  Currently denies any fevers, chills, chest pain, shortness of breath, nausea, vomiting.  She has been watching her diet which has helped.  She did have some milder pain on 6/25 after having milk with her cereal, but has not had issues since.  Past Medical History: Past Medical History:  Diagnosis Date   Allergic rhinitis    Encounter for insertion of mirena IUD    GERD (gastroesophageal reflux disease)    Gonorrhea    Herpes genitalia    High-risk pregnancy 01/17/2014   chorio/mild utering atony   History of hiatal hernia    History of syphilis    Obesity    Sleep apnea    Syphilis      Past Surgical History: Past Surgical History:  Procedure Laterality Date   BREAST REDUCTION SURGERY Bilateral 11/02/2019   Procedure: MAMMARY REDUCTION  (BREAST);  Surgeon: Pace, Collier S, MD;  Location:  [facility];  Service: Plastics;  Laterality: Bilateral;  3 hours   FRACTURE SURGERY  2004   L left tibia   LAPAROSCOPIC GASTRIC SLEEVE RESECTION N/A 07/25/2015   Procedure: LAPAROSCOPIC GASTRIC SLEEVE RESECTION;  Surgeon: Benjamin Hoxworth, MD;  Location: [facility];  Service: General;  Laterality: N/A;   LAPAROSCOPIC GASTRIC SLEEVE RESECTION      Home Medications: Prior to Admission medications   Medication Sig Start Date End Date Taking? Authorizing Provider  amitriptyline (ELAVIL) 100 MG tablet Take 1 tablet (100 mg total) by mouth at bedtime as needed for sleep (and headache). For sleep and headache 05/01/20  Yes Corey, Evan S, MD  etonogestrel-ethinyl estradiol (NUVARING) 0.12-0.015 MG/24HR vaginal ring Insert vaginally and leave in place for 3 consecutive weeks, then remove for 1 week. 09/01/20  Yes Fryer, Margaret M, CNM  Insulin Pen Needle 32G X 6 MM MISC 1 Units by Does not apply route as directed. 09/08/20  Yes Staebler, Andreas, MD  Liraglutide -Weight Management (SAXENDA) 18 MG/3ML SOPN Inject 3 mg into the skin daily. 09/08/20  Yes Staebler, Andreas, MD  Multiple Vitamins-Minerals (HAIR SKIN NAILS PO) Take by mouth.   Yes Lindner, Jodi N, RN  oxyCODONE-acetaminophen (PERCOCET) 5-325 MG tablet Take 1 tablet by mouth every 4 (four) hours as needed for severe pain. 01/05/21 01/05/22 Yes Jessup, Charles, MD  phentermine (ADIPEX-P)   37.5 MG tablet Take 1 tablet (37.5 mg total) by mouth daily before breakfast. 12/13/20  Yes Staebler, Andreas, MD  triamcinolone cream (KENALOG) 0.1 % APPLY TO AFFECTED AREA TWICE A DAY 11/04/20  Yes Pace, Collier S, MD  valACYclovir (VALTREX) 500 MG tablet Take 1 tablet (500 mg total) by mouth daily. As needed 07/17/20  Yes Staebler, Andreas, MD  calcium citrate-vitamin D (CITRACAL+D) 315-200 MG-UNIT tablet Take 1 tablet by mouth daily.    [provider]  Cyanocobalamin (VITAMIN B-12) 1000 MCG/15ML LIQD Take 15 mLs by mouth daily.    [provider]  pantoprazole (PROTONIX) 40 MG tablet Take 1 tablet (40 mg total) by mouth daily for 14 days. 10/19/20 11/02/20  Isaacs, Cameron, MD  Pediatric Multiple Vitamins (FLINTSTONES MULTIVITAMIN PO) Take 1 tablet by mouth daily. Patient not taking: Reported on 12/13/2020    [provider]  sucralfate (CARAFATE) 1 g tablet Take 1 tablet (1 g total) by mouth 4 (four) times daily for 7 days. 10/19/20 10/26/20  Isaacs, Cameron, MD    Allergies: No Known Allergies  Social History:  reports that she has never smoked. She has never used smokeless tobacco. She reports current alcohol use. She reports that she does not use drugs.   Family History: Family History  Problem Relation Age of Onset   Hypertension Father    Hypertension Maternal Grandmother    Diabetes Maternal Grandfather    Hypertension Maternal Grandfather    Other Cousin        breast augmentation 2/2 to pain    Review of Systems: Review of Systems  Constitutional:  Negative for chills and fever.  HENT:  Negative for hearing loss.   Respiratory:  Negative for shortness of breath.   Cardiovascular:  Negative for chest pain.  Gastrointestinal:  Positive for abdominal pain, nausea and vomiting. Negative for constipation and diarrhea.  Genitourinary:  Negative for dysuria.  Musculoskeletal:  Negative for myalgias.  Skin:  Negative for rash.  Neurological:  Negative for dizziness.  Psychiatric/Behavioral:  Negative for depression.    Physical Exam BP 129/87   Pulse (!) 103   Temp 98 F (36.7 C) (Oral)   Ht 5' 1" (1.549 m)   Wt 252 lb (114.3 kg)   LMP 12/20/2020   SpO2 98%   BMI 47.61 kg/m  CONSTITUTIONAL: No acute distress HEENT:  Normocephalic, atraumatic, extraocular motion intact. NECK: Trachea is midline, and there is no jugular venous distension.  RESPIRATORY:  Lungs are clear, and breath sounds are equal bilaterally. Normal respiratory effort without pathologic use of accessory  muscles. CARDIOVASCULAR: Heart is regular without murmurs, gallops, or rubs. GI: The abdomen is soft, obese, non-distended, with some discomfort to palpation in the RUQ.  Negative Murphy's sign. 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 MUSCULOSKELETAL:  Normal muscle strength and tone in all four extremities.  No peripheral edema or cyanosis. SKIN: Skin turgor is normal. There are no pathologic skin lesions.  NEUROLOGIC:  Motor and sensation is grossly normal.  Cranial nerves are grossly intact. PSYCH:  Alert and oriented to person, place and time. Affect is normal.  Laboratory Analysis: Labs from 01/06/21: Na 135, K 3.8, Cl 106, CO2 24, BUN 12, Cr 0.65.  Total bili 0.9, AST 70, ALT 47, Alk Phos 110, lipase 24.  WBC 17.5, Hgb 8.8, Hct 30.2, Plt 346.  Imaging: U/S RUQ 01/06/21: IMPRESSION: Cholelithiasis with no findings of acute cholecystitis or choledocholithiasis.  Assessment and Plan: This is a 34   y.o. female with symptomatic cholelithiasis.  --Discussed with the patient the role of the gallbladder and how the gallstones are affecting her pain and symptoms.  Discussed the possibility of conservative measures with low fat diet, but that this would not be a guarantee that her symptoms would not recur.  She's very interested in surgical intervention instead.  Discussed the role for robotic assisted cholecystectomy and reviewed the surgery at length, including the risks of bleeding, infection, and injury to surrounding structures, post-op recovery and restrictions, pain control, and that this is an outpatient surgery.  She's willing to proceed. --Will schedule the patient for surgery on 01/25/21.  She will coordinate with her job and family and will call us if she needs to change the OR date.  Face-to-face time spent with the patient and care providers was 60 minutes, with more than 50% of the time spent counseling, educating, and coordinating care of the patient.     Melvyn Neth, [facility]

## 2021-01-09 ENCOUNTER — Telehealth: Payer: Self-pay | Admitting: Surgery

## 2021-01-09 NOTE — Telephone Encounter (Signed)
Patient has been advised of Pre-Admission date/time, COVID Testing date and Surgery date.  Surgery Date: 01/25/21 Preadmission Testing Date: 01/19/21 (phone 1p-5p) Covid Testing Date: Not needed.    Patient has been made aware to call [phone], between 1-3:00pm the day before surgery, to find out what time to arrive for surgery.

## 2021-01-11 ENCOUNTER — Ambulatory Visit: Payer: BC Managed Care – PPO | Admitting: Obstetrics and Gynecology

## 2021-01-19 ENCOUNTER — Encounter
Admission: RE | Admit: 2021-01-19 | Discharge: 2021-01-19 | Disposition: A | Discharge status: Home / Self Care | Payer: BC Managed Care – PPO | Source: Ambulatory Visit | Attending: Surgery | Admitting: Surgery

## 2021-01-19 ENCOUNTER — Other Ambulatory Visit: Payer: Self-pay

## 2021-01-19 NOTE — Progress Notes (Signed)
Patient states she has a CPAP machine, but cannot use it at this time because she needs it to be re-fitted.

## 2021-01-19 NOTE — Patient Instructions (Signed)
Your procedure is scheduled on: 01/25/2021 Report to the Registration Desk on the 1st floor of the [facility]. To find out your arrival time, please call [phone] between 1PM - 3PM on: 01/24/2021  REMEMBER: Instructions that are not followed completely may result in serious medical risk, up to and including death; or upon the discretion of your surgeon and anesthesiologist your surgery may need to be rescheduled.  Do not eat food after midnight the night before surgery.  No gum chewing, lozengers or hard candies.  You may however, drink CLEAR liquids up to 2 hours before you are scheduled to arrive for your surgery. Do not drink anything within 2 hours of your scheduled arrival time.  Clear liquids include: - water  - apple juice without pulp - gatorade  - black coffee or tea (Do NOT add milk or creamers to the coffee or tea) Do NOT drink anything that is not on this list.  One week prior to surgery: Stop Anti-inflammatories (NSAIDS) such as Advil, Aleve, Ibuprofen, Motrin, Naproxen, Naprosyn and Aspirin based products such as Excedrin, Goodys Powder, BC Powder. You may however, continue to take Tylenol if needed for pain up until the day of surgery. Stop taking phentermine (ADIPEX-P)  today.  Stop ANY OVER THE COUNTER supplements until after surgery.   No Alcohol for 24 hours before or after surgery.  No Smoking including e-cigarettes for 24 hours prior to surgery.  No chewable tobacco products for at least 6 hours prior to surgery.  No nicotine patches on the day of surgery.  Do not use any "recreational" drugs for at least a week prior to your surgery.  Please be advised that the combination of cocaine and anesthesia may have negative outcomes, up to and including death. If you test positive for cocaine, your surgery will be cancelled.  On the morning of surgery brush your teeth with toothpaste and water, you may rinse your mouth with mouthwash if you wish. Do not  swallow any toothpaste or mouthwash.  Do not wear jewelry, make-up, hairpins, clips or nail polish.  Do not wear lotions, powders, or perfumes.   Do not shave body from the neck down 48 hours prior to surgery just in case you cut yourself which could leave a site for infection.  Also, freshly shaved skin may become irritated if using the CHG soap.  Do not bring valuables to the hospital. [facility] is not responsible for any missing/lost belongings or valuables.   Use CHG Soap or wipes as directed on instruction sheet.  Notify your doctor if there is any change in your medical condition (cold, fever, infection).  Wear comfortable clothing (specific to your surgery type) to the hospital.  If you are being discharged the day of surgery, you will not be allowed to drive home. You will need a responsible adult (18 years or older) to drive you home and stay with you that night.   If you are taking public transportation, you will need to have a responsible adult (18 years or older) with you. Please confirm with your physician that it is acceptable to use public transportation.   Please call the [facility] at [phone] if you have any questions about these instructions.  Surgery Visitation Policy:  Patients undergoing a surgery or procedure may have one family member or support person with them as long as that person is not COVID-19 positive or experiencing its symptoms.  That person may remain in the waiting area during  the procedure.

## 2021-01-23 ENCOUNTER — Encounter
Admission: RE | Admit: 2021-01-23 | Discharge: 2021-01-23 | Disposition: A | Discharge status: Home / Self Care | Payer: BC Managed Care – PPO | Source: Ambulatory Visit | Attending: Surgery | Admitting: Surgery

## 2021-01-23 ENCOUNTER — Other Ambulatory Visit: Payer: Self-pay

## 2021-01-23 ENCOUNTER — Encounter: Payer: Self-pay | Admitting: Urgent Care

## 2021-01-23 DIAGNOSIS — Z01812 Encounter for preprocedural laboratory examination: Secondary | ICD-10-CM | POA: Insufficient documentation

## 2021-01-23 DIAGNOSIS — K8020 Calculus of gallbladder without cholecystitis without obstruction: Secondary | ICD-10-CM | POA: Diagnosis present

## 2021-01-23 DIAGNOSIS — K8010 Calculus of gallbladder with chronic cholecystitis without obstruction: Secondary | ICD-10-CM | POA: Diagnosis not present

## 2021-01-23 LAB — HEMOGLOBIN AND HEMATOCRIT, BLOOD
HCT: 30.5 % — ABNORMAL LOW (ref 36.0–46.0)
Hemoglobin: 8.8 g/dL — ABNORMAL LOW (ref 12.0–15.0)

## 2021-01-25 ENCOUNTER — Ambulatory Visit: Payer: BC Managed Care – PPO | Admitting: Certified Registered"

## 2021-01-25 ENCOUNTER — Encounter: Payer: Self-pay | Admitting: Surgery

## 2021-01-25 ENCOUNTER — Encounter
Admission: RE | Disposition: A | Discharge status: Home / Self Care | Payer: Self-pay | Source: Home / Self Care | Attending: Surgery

## 2021-01-25 ENCOUNTER — Ambulatory Visit
Admission: RE | Admit: 2021-01-25 | Discharge: 2021-01-25 | Disposition: A | Discharge status: Home / Self Care | Payer: BC Managed Care – PPO | Attending: Surgery | Admitting: Surgery

## 2021-01-25 ENCOUNTER — Other Ambulatory Visit: Payer: Self-pay

## 2021-01-25 DIAGNOSIS — K8010 Calculus of gallbladder with chronic cholecystitis without obstruction: Secondary | ICD-10-CM | POA: Diagnosis not present

## 2021-01-25 DIAGNOSIS — K8020 Calculus of gallbladder without cholecystitis without obstruction: Secondary | ICD-10-CM

## 2021-01-25 LAB — POCT PREGNANCY, URINE: Preg Test, Ur: NEGATIVE

## 2021-01-25 SURGERY — CHOLECYSTECTOMY, ROBOT-ASSISTED, LAPAROSCOPIC
Anesthesia: General

## 2021-01-25 MED ORDER — KETOROLAC TROMETHAMINE 30 MG/ML IJ SOLN
INTRAMUSCULAR | Status: DC | PRN
  Administered 2021-01-25: 30 mg via INTRAVENOUS

## 2021-01-25 MED ORDER — KETAMINE HCL 10 MG/ML IJ SOLN
INTRAMUSCULAR | Status: DC | PRN
  Administered 2021-01-25: 30 mg via INTRAVENOUS

## 2021-01-25 MED ORDER — ONDANSETRON HCL 4 MG/2ML IJ SOLN
INTRAMUSCULAR | Status: DC | PRN
  Administered 2021-01-25: 4 mg via INTRAVENOUS

## 2021-01-25 MED ORDER — CEFAZOLIN SODIUM-DEXTROSE 2-4 GM/100ML-% IV SOLN
INTRAVENOUS | Status: AC
  Filled 2021-01-25: qty 100

## 2021-01-25 MED ORDER — LIDOCAINE HCL (CARDIAC) PF 100 MG/5ML IV SOSY
PREFILLED_SYRINGE | INTRAVENOUS | Status: DC | PRN
  Administered 2021-01-25 (×2): 50 mg via INTRAVENOUS

## 2021-01-25 MED ORDER — ORAL CARE MOUTH RINSE: 15.0000 mL | Freq: Once | OROMUCOSAL | Status: AC

## 2021-01-25 MED ORDER — GABAPENTIN 300 MG PO CAPS
ORAL_CAPSULE | ORAL | Status: AC
  Administered 2021-01-25: 300 mg via ORAL
  Filled 2021-01-25: qty 1

## 2021-01-25 MED ORDER — 0.9 % SODIUM CHLORIDE (POUR BTL) OPTIME
TOPICAL | Status: DC | PRN
  Administered 2021-01-25: 500 mL

## 2021-01-25 MED ORDER — ACETAMINOPHEN 10 MG/ML IV SOLN
INTRAVENOUS | Status: AC
  Filled 2021-01-25: qty 100

## 2021-01-25 MED ORDER — GABAPENTIN 300 MG PO CAPS: 300.0000 mg | ORAL_CAPSULE | ORAL | Status: AC

## 2021-01-25 MED ORDER — LACTATED RINGERS IV SOLN: INTRAVENOUS | Status: DC

## 2021-01-25 MED ORDER — MIDAZOLAM HCL 2 MG/2ML IJ SOLN
INTRAMUSCULAR | Status: AC
  Filled 2021-01-25: qty 2

## 2021-01-25 MED ORDER — BUPIVACAINE-EPINEPHRINE (PF) 0.25% -1:200000 IJ SOLN
INTRAMUSCULAR | Status: AC
  Filled 2021-01-25: qty 30

## 2021-01-25 MED ORDER — ONDANSETRON HCL 4 MG/2ML IJ SOLN
INTRAMUSCULAR | Status: AC
  Administered 2021-01-25: 4 mg via INTRAVENOUS
  Filled 2021-01-25: qty 2

## 2021-01-25 MED ORDER — PROMETHAZINE HCL 25 MG/ML IJ SOLN: 6.2500 mg | INTRAMUSCULAR | Status: DC | PRN

## 2021-01-25 MED ORDER — FENTANYL CITRATE (PF) 100 MCG/2ML IJ SOLN
25.0000 ug | INTRAMUSCULAR | Status: DC | PRN
  Administered 2021-01-25: 25 ug via INTRAVENOUS

## 2021-01-25 MED ORDER — OXYCODONE HCL 5 MG PO TABS: 5.0000 mg | ORAL_TABLET | ORAL | 0 refills | Status: DC | PRN

## 2021-01-25 MED ORDER — OXYCODONE HCL 5 MG PO TABS: 5.0000 mg | ORAL_TABLET | Freq: Once | ORAL | Status: AC | PRN

## 2021-01-25 MED ORDER — FAMOTIDINE 20 MG PO TABS: 20.0000 mg | ORAL_TABLET | Freq: Once | ORAL | Status: AC

## 2021-01-25 MED ORDER — CHLORHEXIDINE GLUCONATE 0.12 % MT SOLN: 15.0000 mL | Freq: Once | OROMUCOSAL | Status: AC

## 2021-01-25 MED ORDER — ACETAMINOPHEN 500 MG PO TABS
ORAL_TABLET | ORAL | Status: AC
  Administered 2021-01-25: 1000 mg via ORAL
  Filled 2021-01-25: qty 2

## 2021-01-25 MED ORDER — ACETAMINOPHEN 500 MG PO TABS: 1000.0000 mg | ORAL_TABLET | Freq: Four times a day (QID) | ORAL | Status: DC | PRN

## 2021-01-25 MED ORDER — PHENYLEPHRINE HCL (PRESSORS) 10 MG/ML IV SOLN
INTRAVENOUS | Status: AC
  Filled 2021-01-25: qty 1

## 2021-01-25 MED ORDER — SODIUM CHLORIDE 0.9 % IV SOLN
INTRAVENOUS | Status: DC | PRN
  Administered 2021-01-25: 30 ug/min via INTRAVENOUS

## 2021-01-25 MED ORDER — ONDANSETRON HCL 4 MG/2ML IJ SOLN: 4.0000 mg | Freq: Once | INTRAMUSCULAR | Status: AC

## 2021-01-25 MED ORDER — CEFAZOLIN SODIUM-DEXTROSE 2-4 GM/100ML-% IV SOLN
2.0000 g | INTRAVENOUS | Status: AC
Start: 2021-01-25 — End: 2021-01-25
  Administered 2021-01-25: 2 g via INTRAVENOUS

## 2021-01-25 MED ORDER — KETAMINE HCL 50 MG/5ML IJ SOSY
PREFILLED_SYRINGE | INTRAMUSCULAR | Status: AC
  Filled 2021-01-25: qty 5

## 2021-01-25 MED ORDER — BUPIVACAINE-EPINEPHRINE (PF) 0.25% -1:200000 IJ SOLN
INTRAMUSCULAR | Status: DC | PRN
  Administered 2021-01-25: 30 mL

## 2021-01-25 MED ORDER — PROPOFOL 10 MG/ML IV BOLUS
INTRAVENOUS | Status: DC | PRN
  Administered 2021-01-25: 150 mg via INTRAVENOUS

## 2021-01-25 MED ORDER — IBUPROFEN 600 MG PO TABS
600.0000 mg | ORAL_TABLET | Freq: Three times a day (TID) | ORAL | 1 refills | Status: DC | PRN
Start: 2021-01-25 — End: 2021-03-02

## 2021-01-25 MED ORDER — INDOCYANINE GREEN 25 MG IV SOLR
2.5000 mg | INTRAVENOUS | Status: AC
  Administered 2021-01-25: 2.5 mg via INTRAVENOUS
  Filled 2021-01-25: qty 1

## 2021-01-25 MED ORDER — SUGAMMADEX SODIUM 200 MG/2ML IV SOLN
INTRAVENOUS | Status: DC | PRN
  Administered 2021-01-25: 4 mg via INTRAVENOUS

## 2021-01-25 MED ORDER — CHLORHEXIDINE GLUCONATE CLOTH 2 % EX PADS
6.0000 | MEDICATED_PAD | Freq: Once | CUTANEOUS | Status: DC
Start: 2021-01-25 — End: 2021-01-25

## 2021-01-25 MED ORDER — OXYCODONE HCL 5 MG PO TABS
ORAL_TABLET | ORAL | Status: AC
  Filled 2021-01-25: qty 1

## 2021-01-25 MED ORDER — FENTANYL CITRATE (PF) 100 MCG/2ML IJ SOLN
INTRAMUSCULAR | Status: AC
  Filled 2021-01-25: qty 2

## 2021-01-25 MED ORDER — CHLORHEXIDINE GLUCONATE CLOTH 2 % EX PADS
6.0000 | MEDICATED_PAD | Freq: Once | CUTANEOUS | Status: AC
  Administered 2021-01-25: 6 via TOPICAL

## 2021-01-25 MED ORDER — ROCURONIUM BROMIDE 100 MG/10ML IV SOLN
INTRAVENOUS | Status: DC | PRN
  Administered 2021-01-25: 60 mg via INTRAVENOUS
  Administered 2021-01-25: 10 mg via INTRAVENOUS

## 2021-01-25 MED ORDER — DEXAMETHASONE SODIUM PHOSPHATE 10 MG/ML IJ SOLN
INTRAMUSCULAR | Status: DC | PRN
  Administered 2021-01-25: 10 mg via INTRAVENOUS

## 2021-01-25 MED ORDER — FENTANYL CITRATE (PF) 100 MCG/2ML IJ SOLN
INTRAMUSCULAR | Status: AC
  Administered 2021-01-25: 25 ug via INTRAVENOUS
  Filled 2021-01-25: qty 2

## 2021-01-25 MED ORDER — MIDAZOLAM HCL 2 MG/2ML IJ SOLN
INTRAMUSCULAR | Status: DC | PRN
  Administered 2021-01-25: 2 mg via INTRAVENOUS

## 2021-01-25 MED ORDER — OXYCODONE HCL 5 MG/5ML PO SOLN
5.0000 mg | Freq: Once | ORAL | Status: AC | PRN
  Administered 2021-01-25: 5 mg via ORAL

## 2021-01-25 MED ORDER — FENTANYL CITRATE (PF) 100 MCG/2ML IJ SOLN
INTRAMUSCULAR | Status: DC | PRN
  Administered 2021-01-25 (×2): 50 ug via INTRAVENOUS

## 2021-01-25 MED ORDER — PHENYLEPHRINE HCL (PRESSORS) 10 MG/ML IV SOLN
INTRAVENOUS | Status: DC | PRN
  Administered 2021-01-25 (×3): 100 ug via INTRAVENOUS

## 2021-01-25 MED ORDER — ACETAMINOPHEN 500 MG PO TABS
1000.0000 mg | ORAL_TABLET | ORAL | Status: AC
Start: 2021-01-25 — End: 2021-01-25

## 2021-01-25 MED ORDER — FAMOTIDINE 20 MG PO TABS
ORAL_TABLET | ORAL | Status: AC
  Administered 2021-01-25: 20 mg via ORAL
  Filled 2021-01-25: qty 1

## 2021-01-25 MED ORDER — CHLORHEXIDINE GLUCONATE 0.12 % MT SOLN
OROMUCOSAL | Status: AC
  Administered 2021-01-25: 15 mL via OROMUCOSAL
  Filled 2021-01-25: qty 15

## 2021-01-25 MED ORDER — MEPERIDINE HCL 25 MG/ML IJ SOLN: 6.2500 mg | INTRAMUSCULAR | Status: DC | PRN

## 2021-01-25 SURGICAL SUPPLY — 51 items
ADH SKN CLS APL DERMABOND .7 (GAUZE/BANDAGES/DRESSINGS) ×1
APL PRP STRL LF DISP 70% ISPRP (MISCELLANEOUS) ×1
BAG SPEC RTRVL LRG 6X4 10 (ENDOMECHANICALS) ×1
CANNULA REDUC XI 12-8 STAPL (CANNULA) ×1
CANNULA REDUCER 12-8 DVNC XI (CANNULA) ×1 IMPLANT
CHLORAPREP W/TINT 26 (MISCELLANEOUS) ×2 IMPLANT
CLIP VESOLOCK MED LG 6/CT (CLIP) ×2 IMPLANT
CUP MEDICINE 2OZ PLAST GRAD ST (MISCELLANEOUS) ×2 IMPLANT
DECANTER SPIKE VIAL GLASS SM (MISCELLANEOUS) ×2 IMPLANT
DEFOGGER SCOPE WARMER CLEARIFY (MISCELLANEOUS) ×2 IMPLANT
DERMABOND ADVANCED (GAUZE/BANDAGES/DRESSINGS) ×1
DERMABOND ADVANCED .7 DNX12 (GAUZE/BANDAGES/DRESSINGS) ×1 IMPLANT
DRAPE ARM DVNC X/XI (DISPOSABLE) ×4 IMPLANT
DRAPE COLUMN DVNC XI (DISPOSABLE) ×1 IMPLANT
DRAPE DA VINCI XI ARM (DISPOSABLE) ×4
DRAPE DA VINCI XI COLUMN (DISPOSABLE) ×1
ELECT CAUTERY BLADE TIP 2.5 (TIP) ×2
ELECT REM PT RETURN 9FT ADLT (ELECTROSURGICAL) ×2
ELECTRODE CAUTERY BLDE TIP 2.5 (TIP) ×1 IMPLANT
ELECTRODE REM PT RTRN 9FT ADLT (ELECTROSURGICAL) ×1 IMPLANT
GLOVE SURG SYN 7.0 (GLOVE) ×4 IMPLANT
GLOVE SURG SYN 7.5  E (GLOVE) ×2
GLOVE SURG SYN 7.5 E (GLOVE) ×2 IMPLANT
GOWN STRL REUS W/ TWL LRG LVL3 (GOWN DISPOSABLE) ×4 IMPLANT
GOWN STRL REUS W/TWL LRG LVL3 (GOWN DISPOSABLE) ×8
KIT PINK PAD W/HEAD ARE REST (MISCELLANEOUS) ×2
KIT PINK PAD W/HEAD ARM REST (MISCELLANEOUS) ×1 IMPLANT
LABEL OR SOLS (LABEL) ×2 IMPLANT
MANIFOLD NEPTUNE II (INSTRUMENTS) ×2 IMPLANT
NEEDLE HYPO 22GX1.5 SAFETY (NEEDLE) ×2 IMPLANT
NS IRRIG 500ML POUR BTL (IV SOLUTION) ×2 IMPLANT
OBTURATOR OPTICAL STANDARD 8MM (TROCAR) ×1
OBTURATOR OPTICAL STND 8 DVNC (TROCAR) ×1
OBTURATOR OPTICALSTD 8 DVNC (TROCAR) ×1 IMPLANT
PACK LAP CHOLECYSTECTOMY (MISCELLANEOUS) ×2 IMPLANT
PENCIL ELECTRO HAND CTR (MISCELLANEOUS) ×2 IMPLANT
POUCH SPECIMEN RETRIEVAL 10MM (ENDOMECHANICALS) ×2 IMPLANT
SEAL CANN UNIV 5-8 DVNC XI (MISCELLANEOUS) ×3 IMPLANT
SEAL XI 5MM-8MM UNIVERSAL (MISCELLANEOUS) ×3
SET TUBE SMOKE EVAC HIGH FLOW (TUBING) ×2 IMPLANT
SOLUTION ELECTROLUBE (MISCELLANEOUS) ×2 IMPLANT
SPONGE T-LAP 18X18 ~~LOC~~+RFID (SPONGE) ×2 IMPLANT
SPONGE T-LAP 4X18 ~~LOC~~+RFID (SPONGE) ×2 IMPLANT
STAPLER CANNULA SEAL DVNC XI (STAPLE) ×1 IMPLANT
STAPLER CANNULA SEAL XI (STAPLE) ×1
SUT MNCRL AB 4-0 PS2 18 (SUTURE) ×4 IMPLANT
SUT VIC AB 3-0 SH 27 (SUTURE) ×2
SUT VIC AB 3-0 SH 27X BRD (SUTURE) ×1 IMPLANT
SUT VICRYL 0 AB UR-6 (SUTURE) ×4 IMPLANT
TAPE TRANSPORE STRL 2 31045 (GAUZE/BANDAGES/DRESSINGS) ×2 IMPLANT
TROCAR BALLN GELPORT 12X130M (ENDOMECHANICALS) ×2 IMPLANT

## 2021-01-25 NOTE — Op Note (Signed)
  Procedure Date:  01/25/2021  Pre-operative Diagnosis:  Symptomatic cholelithiasis  Post-operative Diagnosis:  Symptomatic cholelithiasis  Procedure:  Robotic assisted cholecystectomy with ICG FireFly cholangiogram  Surgeon:  Melvyn Neth, MD  Assistant:  Charlotte Crumb, PA-S  Anesthesia:  General endotracheal  Estimated Blood Loss:  5 ml  Specimens:  gallbladder  Complications:  None  Indications for Procedure:  This is a 35 y.o. female who presents with abdominal pain and workup revealing symptomatic cholelithiasis.  The benefits, complications, treatment options, and expected outcomes were discussed with the patient. The risks of bleeding, infection, recurrence of symptoms, failure to resolve symptoms, bile duct damage, bile duct leak, retained common bile duct stone, bowel injury, and need for further procedures were all discussed with the patient and she was willing to proceed.  Description of Procedure: The patient was correctly identified in the preoperative area and brought into the operating room.  The patient was placed supine with VTE prophylaxis in place.  Appropriate time-outs were performed.  Anesthesia was induced and the patient was intubated.  Appropriate antibiotics were infused.  The abdomen was prepped and draped in a sterile fashion. An infraumbilical incision was made. A cutdown technique was used to enter the abdominal cavity without injury, and a 12 mm robotic port was inserted.  Pneumoperitoneum was obtained with appropriate opening pressures.  Three 8-mm ports were placed in the mid abdomen at the level of the umbilicus under direct visualization.  The DaVinci platform was docked, camera targeted, and instruments were placed under direct visualization.  The gallbladder was identified.  The fundus was grasped and retracted cephalad.  Adhesions were lysed bluntly and with electrocautery. The infundibulum was grasped and retracted laterally, exposing the  peritoneum overlying the gallbladder.  This was incised with electrocautery and extended on either side of the gallbladder.  FireFly cholangiogram was then obtained, and we were able to clearly identify the cystic duct and common bile duct.  The cystic duct and cystic artery were carefully dissected with combination of cautery and blunt dissection.  Both were clipped twice proximally and once distally, cutting in between.  The gallbladder was taken from the gallbladder fossa in a retrograde fashion with electrocautery. The gallbladder was placed in an Endocatch bag. The liver bed was inspected and any bleeding was controlled with electrocautery. The right upper quadrant was then inspected again revealing intact clips, no bleeding, and no ductal injury.  The area was thoroughly irrigated.  The 8 mm ports were removed under direct visualization and the 12 mm port was removed.  The Endocatch bag was brought out via the umbilical incision. The fascial opening was closed using 0 vicryl suture.  Local anesthetic was infused in all incisions.  The umbilical incision was closed with 3-0 Vicryl and 4-0 Monocryl, and the remaining incisions were closed with 4-0 Monocryl.  The wounds were cleaned and sealed with DermaBond.  The patient was emerged from anesthesia and extubated and brought to the recovery room for further management.  The patient tolerated the procedure well and all counts were correct at the end of the case.   Melvyn Neth, MD

## 2021-01-25 NOTE — Anesthesia Procedure Notes (Signed)
Procedure Name: Intubation Date/Time: 01/25/2021 7:41 AM Performed by: Biagio Borg, CRNA Pre-anesthesia Checklist: Patient identified, Emergency Drugs available, Suction available and Patient being monitored Patient Re-evaluated:Patient Re-evaluated prior to induction Oxygen Delivery Method: Circle system utilized Preoxygenation: Pre-oxygenation with 100% oxygen Induction Type: IV induction Ventilation: Mask ventilation without difficulty Laryngoscope Size: McGraph and 3 Grade View: Grade II Tube type: Oral Number of attempts: 1 Airway Equipment and Method: Stylet Placement Confirmation: ETT inserted through vocal cords under direct vision, positive ETCO2 and breath sounds checked- equal and bilateral Secured at: 21 cm Tube secured with: Tape Dental Injury: Teeth and Oropharynx as per pre-operative assessment

## 2021-01-25 NOTE — Transfer of Care (Signed)
Immediate Anesthesia Transfer of Care Note  Patient: Nancy Owen  Procedure(s) Performed: XI ROBOTIC ASSISTED LAPAROSCOPIC CHOLECYSTECTOMY INDOCYANINE GREEN FLUORESCENCE IMAGING (ICG)  Patient Location: PACU  Anesthesia Type:General  Level of Consciousness: awake  Airway & Oxygen Therapy: Patient Spontanous Breathing and Patient connected to face mask oxygen  Post-op Assessment: Report given to RN and Post -op Vital signs reviewed and stable  Post vital signs: Reviewed and stable  Last Vitals:  Vitals Value Taken Time  BP 123/67 01/25/21 0923  Temp    Pulse 104 01/25/21 0925  Resp 19 01/25/21 0923  SpO2 98 % 01/25/21 0925  Vitals shown include unvalidated device data.  Last Pain:  Vitals:   01/25/21 0634  TempSrc: Tympanic  PainSc: 3          Complications: No notable events documented.

## 2021-01-25 NOTE — Interval H&P Note (Signed)
History and Physical Interval Note:  01/25/2021 7:14 AM  Nancy Owen  has presented today for surgery, with the diagnosis of symptomatic cholelithiasis.  The various methods of treatment have been discussed with the patient and family. After consideration of risks, benefits and other options for treatment, the patient has consented to  Procedure(s): XI ROBOTIC ASSISTED LAPAROSCOPIC CHOLECYSTECTOMY (N/A) [place] (ICG) (N/A) as a surgical intervention.  The patient's history has been reviewed, patient examined, no change in status, stable for surgery.  I have reviewed the patient's chart and labs.  Questions were answered to the patient's satisfaction.     Megha Agnes

## 2021-01-25 NOTE — Anesthesia Preprocedure Evaluation (Signed)
Anesthesia Evaluation  Patient identified by MRN, date of birth, ID band Patient awake    Reviewed: Allergy & Precautions, NPO status , Patient's Chart, lab work & pertinent test results  History of Anesthesia Complications Negative for: history of anesthetic complications  Airway Mallampati: III  TM Distance: <3 FB Neck ROM: Full    Dental no notable dental hx.    Pulmonary sleep apnea (doesn't currently have CPAP, being fitted for one) , neg COPD,    breath sounds clear to auscultation- rhonchi (-) wheezing      Cardiovascular Exercise Tolerance: Good (-) hypertension(-) CAD, (-) Past MI, (-) Cardiac Stents and (-) CABG  Rhythm:Regular Rate:Normal - Systolic murmurs and - Diastolic murmurs    Neuro/Psych neg Seizures negative neurological ROS  negative psych ROS   GI/Hepatic Neg liver ROS, hiatal hernia, GERD  ,  Endo/Other  negative endocrine ROSneg diabetes  Renal/GU negative Renal ROS     Musculoskeletal negative musculoskeletal ROS (+)   Abdominal (+) + obese,   Peds  Hematology negative hematology ROS (+)   Anesthesia Other Findings Past Medical History: No date: Allergic rhinitis No date: Encounter for insertion of mirena IUD No date: GERD (gastroesophageal reflux disease) No date: Gonorrhea No date: Herpes genitalia 01/17/2014: High-risk pregnancy     Comment:  chorio/mild utering atony No date: History of hiatal hernia No date: History of syphilis No date: Obesity No date: Sleep apnea No date: Syphilis   Reproductive/Obstetrics                             Anesthesia Physical Anesthesia Plan  ASA: 2  Anesthesia Plan: General   Post-op Pain Management:    Induction: Intravenous  PONV Risk Score and Plan: 2 and Ondansetron, Dexamethasone and Midazolam  Airway Management Planned: Oral ETT  Additional Equipment:   Intra-op Plan:   Post-operative Plan:  Extubation in OR  Informed Consent: I have reviewed the patients History and Physical, chart, labs and discussed the procedure including the risks, benefits and alternatives for the proposed anesthesia with the patient or authorized representative who has indicated his/her understanding and acceptance.     Dental advisory given  Plan Discussed with: CRNA and Anesthesiologist  Anesthesia Plan Comments:         Anesthesia Quick Evaluation

## 2021-01-25 NOTE — Anesthesia Postprocedure Evaluation (Signed)
Anesthesia Post Note  Patient: [name]  Procedure(s) Performed: XI ROBOTIC ASSISTED LAPAROSCOPIC CHOLECYSTECTOMY INDOCYANINE GREEN FLUORESCENCE IMAGING (ICG)  Patient location during evaluation: PACU Anesthesia Type: General Level of consciousness: awake and alert and oriented Pain management: pain level controlled Vital Signs Assessment: post-procedure vital signs reviewed and stable Respiratory status: spontaneous breathing, nonlabored ventilation and respiratory function stable Cardiovascular status: blood pressure returned to baseline and stable Postop Assessment: no signs of nausea or vomiting Anesthetic complications: no   No notable events documented.   Last Vitals:  Vitals:   01/25/21 1023 01/25/21 1053  BP: 115/62 (!) 107/58  Pulse: 73 88  Resp: 18 18  Temp: (!) 36.1 C   SpO2: 97% 100%    Last Pain:  Vitals:   01/25/21 1027  TempSrc:   PainSc: Asleep                 Aya Geisel

## 2021-01-25 NOTE — Discharge Instructions (Signed)
AMBULATORY SURGERY  ?DISCHARGE INSTRUCTIONS ? ? ?The drugs that you were given will stay in your system until tomorrow so for the next 24 hours you should not: ? ?Drive an automobile ?Make any legal decisions ?Drink any alcoholic beverage ? ? ?You may resume regular meals tomorrow.  Today it is better to start with liquids and gradually work up to solid foods. ? ?You may eat anything you prefer, but it is better to start with liquids, then soup and crackers, and gradually work up to solid foods. ? ? ?Please notify your doctor immediately if you have any unusual bleeding, trouble breathing, redness and pain at the surgery site, drainage, fever, or pain not relieved by medication. ? ? ? ?Additional Instructions: ? ? ? ?Please contact your physician with any problems or Same Day Surgery at [phone], Monday through Friday 6 am to 4 pm, or [place] [place] Main number at [phone].  ?

## 2021-01-26 LAB — SURGICAL PATHOLOGY

## 2021-02-02 ENCOUNTER — Encounter: Payer: Self-pay | Admitting: Obstetrics and Gynecology

## 2021-02-02 ENCOUNTER — Ambulatory Visit (INDEPENDENT_AMBULATORY_CARE_PROVIDER_SITE_OTHER): Payer: BC Managed Care – PPO | Admitting: Obstetrics and Gynecology

## 2021-02-02 ENCOUNTER — Other Ambulatory Visit: Payer: Self-pay

## 2021-02-02 VITALS — BP 128/76 | Ht 61.0 in | Wt 246.0 lb

## 2021-02-02 DIAGNOSIS — Z6841 Body Mass Index (BMI) 40.0 and over, adult: Secondary | ICD-10-CM

## 2021-02-02 MED ORDER — PHENTERMINE HCL 37.5 MG PO TABS: 37.5000 mg | ORAL_TABLET | Freq: Every day | ORAL | 0 refills | Status: DC

## 2021-02-02 NOTE — Progress Notes (Signed)
Gynecology Office Visit  Chief Complaint:  Chief Complaint  Patient presents with   Follow-up    Medications F/U - no concerns. RM 4    History of Present Illness: Patientis a 35 y.o. G28P1011 female, who presents for the evaluation of the desire to lose weight. She has lost 10 pounds 1 months. The patient states the following symptoms since starting her weight loss therapy: appetite suppression, energy, and weight loss.  The patient also reports no other ill effects. The patient specifically denies heart palpitations, anxiety, and insomnia.    Review of Systems: 10 point review of systems negative unless otherwise noted in HPI  Past Medical History:  Past Medical History:  Diagnosis Date   Allergic rhinitis    Encounter for insertion of mirena IUD    GERD (gastroesophageal reflux disease)    Gonorrhea    Herpes genitalia    High-risk pregnancy 01/17/2014   chorio/mild utering atony   History of hiatal hernia    History of syphilis    Obesity    Sleep apnea    Syphilis     Past Surgical History:  Past Surgical History:  Procedure Laterality Date   BREAST REDUCTION SURGERY Bilateral 11/02/2019   Procedure: MAMMARY REDUCTION  (BREAST);  Surgeon: Cindra Presume, MD;  Location: [facility];  Service: Plastics;  Laterality: Bilateral;  3 hours   FRACTURE SURGERY  2004   L left tibia   LAPAROSCOPIC GASTRIC SLEEVE RESECTION N/A 07/25/2015   Procedure: LAPAROSCOPIC GASTRIC SLEEVE RESECTION;  Surgeon: Excell Seltzer, MD;  Location: [facility];  Service: General;  Laterality: N/A;   LAPAROSCOPIC GASTRIC SLEEVE RESECTION      Gynecologic History: Patient's last menstrual period was 01/26/2021.  Obstetric History: G2P1011  Family History:  Family History  Problem Relation Age of Onset   Hypertension Father    Hypertension Maternal Grandmother    Diabetes Maternal Grandfather    Hypertension Maternal Grandfather    Other Cousin        breast augmentation  2/2 to pain    Social History:  Social History   Socioeconomic History   Marital status: Married    Spouse name: Insurance claims handler   Number of children: 1   Years of education: college   Highest education level: Not on file  Occupational History   Occupation: AR specialist- Sport and exercise psychologist: [employer]    Comment: [employer]  Tobacco Use   Smoking status: Never   Smokeless tobacco: Never  Vaping Use   Vaping Use: Never used  Substance and Sexual Activity   Alcohol use: Yes    Alcohol/week: 0.0 standard drinks    Comment: not much now, just at holidays wine cooler   Drug use: No   Sexual activity: Yes    Birth control/protection: Inserts    Comment: Nuvaring  Other Topics Concern   Not on file  Social History Narrative   09/09/19   From: [place] - her mom moved her after [place] finished college   Living: with husband [name] and daughter [name]   Work: Medical illustrator - Insurance claims handler      Family: mom nearby      Enjoys: reading, spend time with daughter - swimming, dancing      Exercise: not currently, planning to start exercise plan with sister   Diet: intermittent fasting       Safety   Seat belts: Yes    Guns: No   Safe in relationships: Yes  Social Determinants of Health   Financial Resource Strain: Not on file  Food Insecurity: Not on file  Transportation Needs: Not on file  Physical Activity: Not on file  Stress: Not on file  Social Connections: Not on file  Intimate Partner Violence: Not on file    Allergies:  No Known Allergies  Medications: Prior to Admission medications   Medication Sig Start Date End Date Taking? Authorizing Provider  acetaminophen (TYLENOL) 500 MG tablet Take 500 mg by mouth every 6 (six) hours as needed for mild pain.    [provider]  acetaminophen (TYLENOL) 500 MG tablet Take 2 tablets (1,000 mg total) by mouth every 6 (six) hours as needed for mild pain. 01/25/21   Olean Ree, MD  amitriptyline (ELAVIL) 100 MG  tablet Take 1 tablet (100 mg total) by mouth at bedtime as needed for sleep (and headache). For sleep and headache Patient taking differently: Take 100 mg by mouth at bedtime. For sleep and headache 05/01/20   Gregor Hams, MD  etonogestrel-ethinyl estradiol (NUVARING) 0.12-0.015 MG/24HR vaginal ring Insert vaginally and leave in place for 3 consecutive weeks, then remove for 1 week. Patient taking differently: Place 1 each vaginally See admin instructions. Insert vaginally and leave in place for 3 consecutive weeks, then remove for 1 week. 09/01/20   Imagene Riches, CNM  ibuprofen (ADVIL) 600 MG tablet Take 1 tablet (600 mg total) by mouth every 8 (eight) hours as needed for moderate pain. 01/25/21   Olean Ree, MD  loratadine (CLARITIN) 10 MG tablet Take 10 mg by mouth 2 (two) times a week.    [provider]  Multiple Vitamins-Minerals (EMERGEN-C IMMUNE PO) Take 3 capsules by mouth at bedtime. With vitamin D    [provider]  Multiple Vitamins-Minerals (HAIR SKIN NAILS PO) Take 2 capsules by mouth daily with lunch. Billey Chang, RN  ondansetron (ZOFRAN) 4 MG tablet Take 4 mg by mouth every 8 (eight) hours as needed for nausea or vomiting.    [provider]  oxyCODONE (OXY IR/ROXICODONE) 5 MG immediate release tablet Take 1 tablet (5 mg total) by mouth every 4 (four) hours as needed for severe pain. 01/25/21   Olean Ree, MD  Pediatric Multiple Vitamins (FLINTSTONES MULTIVITAMIN PO) Take 2 tablets by mouth daily. Gummies    [provider]  phentermine (ADIPEX-P) 37.5 MG tablet Take 1 tablet (37.5 mg total) by mouth daily before breakfast. 12/13/20   [name], MD  triamcinolone cream (KENALOG) 0.1 % APPLY TO AFFECTED AREA TWICE A DAY 11/04/20   Cindra Presume, MD  valACYclovir (VALTREX) 500 MG tablet Take 1 tablet (500 mg total) by mouth daily. As needed Patient taking differently: Take 500 mg by mouth daily as needed (outbreak).  07/17/20   [name], MD    Physical Exam Blood pressure 128/76, height '5\' 1"'$  (1.549 m), weight 246 lb (111.6 kg), last menstrual period 01/26/2021. Wt Readings from Last 3 Encounters:  02/02/21 246 lb (111.6 kg)  01/25/21 225 lb (102.1 kg)  01/19/21 250 lb (113.4 kg)  Body mass index is 46.48 kg/m.  General: NAD HEENT: normocephalic, anicteric Pulmonary: no increased work of breathing Extremities: no edema Neurologic: normal gait    Assessment: 35 y.o. G2P1011 medication follow up weight loss   Plan: Problem List Items Addressed This Visit       Other   Obesity - Primary   Relevant Medications   phentermine (ADIPEX-P) 37.5 MG tablet  1) 1500 Calorie ADA Diet  2) Patient education given regarding appropriate lifestyle changes for weight loss including: regular physical activity, healthy coping strategies, caloric restriction and healthy eating patterns.  3) Patient will be started on weight loss medication. The risks and benefits and side effects of medication, such as Adipex (Phenteramine) ,  Tenuate (Diethylproprion), Belviq (lorcarsin), Contrave (buproprion/naltrexone), Qsymia (phentermine/topiramate), and Saxenda (liraglutide) is discussed. The pros and cons of suppressing appetite and boosting metabolism is discussed. Risks of tolerence and addiction is discussed for selected agents discussed. Use of medicine will ne short term, such as 3-4 months at a time followed by a period of time off of the medicine to avoid these risks and side effects for Adipex, Qsymia, and Tenuate discussed. Pt to call with any negative side effects and agrees to keep follow up appts.  4) Patient to take medication, with the benefits of appetite suppression and metabolism boost d/w pt, along with the side effects and risk factors of long term use that will be avoided with our use of short bursts of therapy. Rx provided.    5) 15 minutes face-to-face; with counseling/coordination of care  > 50 percent of visit related to obesity and ongoing management/treatment   6)    [name], MD, [facility], [facility] 02/02/2021, 4:20 PM

## 2021-02-05 ENCOUNTER — Telehealth: Payer: Self-pay | Admitting: *Deleted

## 2021-02-05 NOTE — Telephone Encounter (Signed)
Faxed FMLA to [place] [facility] at [phone] and [facility] [phone]

## 2021-02-07 ENCOUNTER — Other Ambulatory Visit: Payer: Self-pay

## 2021-02-07 ENCOUNTER — Encounter: Payer: Self-pay | Admitting: Physician Assistant

## 2021-02-07 ENCOUNTER — Ambulatory Visit (INDEPENDENT_AMBULATORY_CARE_PROVIDER_SITE_OTHER): Payer: BC Managed Care – PPO | Admitting: Physician Assistant

## 2021-02-07 VITALS — BP 117/81 | HR 96 | Temp 98.9°F | Ht 61.0 in | Wt 241.0 lb

## 2021-02-07 DIAGNOSIS — Z09 Encounter for follow-up examination after completed treatment for conditions other than malignant neoplasm: Secondary | ICD-10-CM

## 2021-02-07 DIAGNOSIS — K8020 Calculus of gallbladder without cholecystitis without obstruction: Secondary | ICD-10-CM

## 2021-02-07 NOTE — Progress Notes (Signed)
[facility] POST-OP OFFICE VISIT  02/07/2021  HPI: Nancy Owen is a 35 y.o. female 13 days s/p robotic assisted laparoscopic cholecystectomy for symptoms cholelithiasis with Dr Hampton Abbot  She reports that she has done okay since surgery Mostly complaining of right sided back discomfort, which is new. She is unsure if she is moving differently or pulled a muscle. She is taking tylenol and oxycodone QHS for this which helps Additionally, she is reporting relatively frequent diarrhea. She is trying to stick with a mostly liquid diet and avoiding faty foods. Has not tied anything for this No fever, chills, nausea, emesis No issues with her incisions No other complaints  Vital signs: BP 117/81   Pulse 96   Temp 98.9 F (37.2 C)   Ht '5\' 1"'$  (1.549 m)   Wt 241 lb (109.3 kg)   LMP 01/26/2021   SpO2 99%   BMI 45.54 kg/m    Physical Exam: Constitutional: Well appearing female, NAD Abdomen: Soft, no abdominal tenderness, non-distended, no rebound/guarding MSK: She does have more right sided paraspinal tenderness, no CVA tenderness Skin: Laparoscopic incisions are well healed, no erythema or drainage    Assessment/Plan: This is a 35 y.o. female 13 days s/p robotic assisted laparoscopic cholecystectomy for symptoms cholelithiasis   - continue current pain regimen, try heating pads vs ice pack  - start OTC Imodium for diarrhea; reviewed dietary recommendations  - reviewed wound care  - reviewed lifting restrictions; 4 weeks total  - Reviewed pathology; Mild CCC; negative for malignancy   - she would like to follow up in 2 weeks for re-check   -- Edison Simon, PA-C [facility] 02/07/2021, 3:11 PM [phone] M-F: 7am - 4pm

## 2021-02-07 NOTE — Patient Instructions (Addendum)

## 2021-02-22 ENCOUNTER — Ambulatory Visit (INDEPENDENT_AMBULATORY_CARE_PROVIDER_SITE_OTHER): Payer: BC Managed Care – PPO | Admitting: Physician Assistant

## 2021-02-22 ENCOUNTER — Other Ambulatory Visit: Payer: Self-pay

## 2021-02-22 ENCOUNTER — Encounter: Payer: Self-pay | Admitting: Physician Assistant

## 2021-02-22 VITALS — BP 115/76 | HR 105 | Temp 98.8°F | Ht 61.0 in | Wt 239.4 lb

## 2021-02-22 DIAGNOSIS — Z09 Encounter for follow-up examination after completed treatment for conditions other than malignant neoplasm: Secondary | ICD-10-CM

## 2021-02-22 DIAGNOSIS — K8020 Calculus of gallbladder without cholecystitis without obstruction: Secondary | ICD-10-CM

## 2021-02-22 NOTE — Progress Notes (Signed)
[facility] POST-OP OFFICE VISIT  02/22/2021  HPI: Nancy Owen is a 35 y.o. female 27 days s/p robotic assisted laparoscopic cholecystectomy for symptoms cholelithiasis with Dr Hampton Abbot  Since her last visit, her abdominal pain, back pain, and diarrhea have now resolved No fever, chills, nausea, emesis She has resumed a normal diet No other complaints  Vital signs: BP 115/76   Pulse (!) 105   Temp 98.8 F (37.1 C) (Oral)   Ht '5\' 1"'$  (1.549 m)   Wt 239 lb 6.4 oz (108.6 kg)   LMP 01/26/2021   SpO2 98%   BMI 45.23 kg/m    Physical Exam: Constitutional: Well appearing female, NAD Abdomen: Soft, non-tender, non-distended, no rebound/guarding Skin: Laparoscopic incisions are well healed, no erythema or drainage   Assessment/Plan: This is a 35 y.o. female 27 days s/p robotic assisted laparoscopic cholecystectomy for symptoms cholelithiasis   - Pain control prn  - Reviewed wound care; nothing further  - She has just about completed lifting restrictions  - She can rtc on as needed basis   -- Edison Simon, PA-C [facility] 02/22/2021, 10:42 AM [phone] M-F: 7am - 4pm

## 2021-02-22 NOTE — Patient Instructions (Signed)

## 2021-03-02 ENCOUNTER — Other Ambulatory Visit: Payer: Self-pay

## 2021-03-02 ENCOUNTER — Encounter: Payer: Self-pay | Admitting: Obstetrics and Gynecology

## 2021-03-02 ENCOUNTER — Ambulatory Visit (INDEPENDENT_AMBULATORY_CARE_PROVIDER_SITE_OTHER): Payer: BC Managed Care – PPO | Admitting: Obstetrics and Gynecology

## 2021-03-02 VITALS — BP 132/78 | Ht 61.0 in | Wt 242.0 lb

## 2021-03-02 DIAGNOSIS — Z6841 Body Mass Index (BMI) 40.0 and over, adult: Secondary | ICD-10-CM

## 2021-03-02 MED ORDER — PHENTERMINE HCL 37.5 MG PO TABS: 37.5000 mg | ORAL_TABLET | Freq: Every day | ORAL | 0 refills | Status: DC

## 2021-03-02 NOTE — Progress Notes (Signed)
Gynecology Office Visit  Chief Complaint:  Chief Complaint  Nancy Owen presents with   Follow-up    Weight loss - no concerns. RM 5    History of Present Illness: Patientis a 35 y.o. G24P1011 female, who presents for the evaluation of the desire to lose weight. She gained 1 pounds  1 months. The Nancy Owen states the following symptoms since starting her weight loss therapy: appetite suppression, energy, and weight loss.  The Nancy Owen also reports no other ill effects. The Nancy Owen specifically denies heart palpitations, anxiety, and insomnia.   Did meet with [facility], plugged in with dietician.   Review of Systems: 10 point review of systems negative unless otherwise noted in HPI  Past Medical History:  Past Medical History:  Diagnosis Date   Allergic rhinitis    Encounter for insertion of mirena IUD    GERD (gastroesophageal reflux disease)    Gonorrhea    Herpes genitalia    High-risk pregnancy 01/17/2014   chorio/mild utering atony   History of hiatal hernia    History of syphilis    Obesity    Sleep apnea    Syphilis     Past Surgical History:  Past Surgical History:  Procedure Laterality Date   BREAST REDUCTION SURGERY Bilateral 11/02/2019   Procedure: MAMMARY REDUCTION  (BREAST);  Surgeon: Cindra Presume, MD;  Location: [facility];  Service: Plastics;  Laterality: Bilateral;  3 hours   FRACTURE SURGERY  2004   L left tibia   LAPAROSCOPIC GASTRIC SLEEVE RESECTION N/A 07/25/2015   Procedure: LAPAROSCOPIC GASTRIC SLEEVE RESECTION;  Surgeon: Excell Seltzer, MD;  Location: [facility];  Service: General;  Laterality: N/A;   LAPAROSCOPIC GASTRIC SLEEVE RESECTION      Gynecologic History: Nancy Owen's last menstrual period was 01/28/2021.  Obstetric History: G2P1011  Family History:  Family History  Problem Relation Age of Onset   Hypertension Father    Hypertension Maternal Grandmother    Diabetes Maternal Grandfather    Hypertension  Maternal Grandfather    Other Cousin        breast augmentation 2/2 to pain    Social History:  Social History   Socioeconomic History   Marital status: Married    Spouse name: Insurance claims handler   Number of children: 1   Years of education: college   Highest education level: Not on file  Occupational History   Occupation: AR specialist- Sport and exercise psychologist: [employer]    Comment: [employer]  Tobacco Use   Smoking status: Never   Smokeless tobacco: Never  Vaping Use   Vaping Use: Never used  Substance and Sexual Activity   Alcohol use: Yes    Alcohol/week: 0.0 standard drinks    Comment: not much now, just at holidays wine cooler   Drug use: No   Sexual activity: Yes    Birth control/protection: Inserts    Comment: Nuvaring  Other Topics Concern   Not on file  Social History Narrative   09/09/19   From: [place] - her mom moved her after [place] finished college   Living: with husband [name] and daughter [name]   Work: Medical illustrator - Insurance claims handler      Family: mom nearby      Enjoys: reading, spend time with daughter - swimming, dancing      Exercise: not currently, planning to start exercise plan with sister   Diet: intermittent fasting       Safety   Seat belts:  Yes    Guns: No   Safe in relationships: Yes    Social Determinants of Radio broadcast assistant Strain: Not on file  Food Insecurity: Not on file  Transportation Needs: Not on file  Physical Activity: Not on file  Stress: Not on file  Social Connections: Not on file  Intimate Partner Violence: Not on file    Allergies:  No Known Allergies  Medications: Prior to Admission medications   Medication Sig Start Date End Date Taking? Authorizing Provider  amitriptyline (ELAVIL) 100 MG tablet Take 1 tablet (100 mg total) by mouth at bedtime as needed for sleep (and headache). For sleep and headache Nancy Owen taking differently: Take 100 mg by mouth at bedtime. For sleep and headache 05/01/20  Yes Gregor Hams, MD  loratadine (CLARITIN) 10 MG tablet Take 10 mg by mouth 2 (two) times a week.   Yes [provider]  Multiple Vitamins-Minerals (EMERGEN-C IMMUNE PO) Take 3 capsules by mouth at bedtime. With vitamin D   Yes [provider]  Multiple Vitamins-Minerals (HAIR SKIN NAILS PO) Take 2 capsules by mouth daily with lunch. Gummie   Yes Joline Salt, RN  Pediatric Multiple Vitamins (FLINTSTONES MULTIVITAMIN PO) Take 2 tablets by mouth daily. Gummies   Yes [provider]  phentermine (ADIPEX-P) 37.5 MG tablet Take 1 tablet (37.5 mg total) by mouth daily before breakfast. 02/02/21  Yes [name], MD  acetaminophen (TYLENOL) 500 MG tablet Take 500 mg by mouth every 6 (six) hours as needed for mild pain.    [provider]  acetaminophen (TYLENOL) 500 MG tablet Take 2 tablets (1,000 mg total) by mouth every 6 (six) hours as needed for mild pain. 01/25/21   Olean Ree, MD  etonogestrel-ethinyl estradiol (NUVARING) 0.12-0.015 MG/24HR vaginal ring Insert vaginally and leave in place for 3 consecutive weeks, then remove for 1 week. Nancy Owen taking differently: Place 1 each vaginally See admin instructions. Insert vaginally and leave in place for 3 consecutive weeks, then remove for 1 week. 09/01/20   Imagene Riches, CNM  triamcinolone cream (KENALOG) 0.1 % APPLY TO AFFECTED AREA TWICE A DAY Nancy Owen not taking: Reported on 03/02/2021 11/04/20   Cindra Presume, MD  valACYclovir (VALTREX) 500 MG tablet Take 1 tablet (500 mg total) by mouth daily. As needed Nancy Owen taking differently: Take 500 mg by mouth daily as needed (outbreak). 07/17/20   [name], MD    Physical Exam Blood pressure 132/78, height '5\' 1"'$  (1.549 m), weight 242 lb (109.8 kg), last menstrual period 01/28/2021. Wt Readings from Last 3 Encounters:  03/02/21 242 lb (109.8 kg)  02/22/21 239 lb 6.4 oz (108.6 kg)  02/07/21 241 lb (109.3 kg)  Body mass index is 45.73 kg/m.   General:  NAD HEENT: normocephalic, anicteric Thyroid: no enlargement Pulmonary: no increased work of breathing Neurologic: Grossly intact Psychiatric: mood appropriate, affect full  Assessment: 35 y.o. G2P1011 weight loss follow up   Plan: Problem List Items Addressed This Visit   None   1) 1500 Calorie ADA Diet  2) Nancy Owen education given regarding appropriate lifestyle changes for weight loss including: regular physical activity, healthy coping strategies, caloric restriction and healthy eating patterns.  3) Nancy Owen to take medication, with the benefits of appetite suppression and metabolism boost d/w pt, along with the side effects and risk factors of long term use that will be avoided with our use of short bursts of therapy. Rx provided.   - continue with phentermine for  now  4) 15 minutes face-to-face; with counseling/coordination of care > 50 percent of visit related to obesity and ongoing management/treatment   5) Return in about 4 weeks (around 03/30/2021) for medication follow up.    [name], MD, [facility], [facility] 03/02/2021, 4:20 PM

## 2021-03-12 ENCOUNTER — Other Ambulatory Visit: Payer: Self-pay

## 2021-03-12 ENCOUNTER — Encounter: Payer: Self-pay | Admitting: Dietician

## 2021-03-12 ENCOUNTER — Encounter: Payer: BC Managed Care – PPO | Attending: Surgery | Admitting: Dietician

## 2021-03-12 VITALS — Ht 61.0 in | Wt 239.2 lb

## 2021-03-12 DIAGNOSIS — Z6841 Body Mass Index (BMI) 40.0 and over, adult: Secondary | ICD-10-CM | POA: Diagnosis not present

## 2021-03-12 NOTE — Patient Instructions (Addendum)
Check into baritastic app, and FitOn app for help and support with diet, food tracking, and exercise options Work to keep carb intake to 15g or less, gradually transitioning to low carb pre op diet. Try Trop50 juice for a low sugar option Follow a schedule of eating a meal or snack every 3-5 hours; eat dinner before 7:30pm as often as possible. Avoid drinking fluids 15 minutes before, and while eating meals, and wait 30 minutes after finishing a meal to resume drinking.

## 2021-03-12 NOTE — Progress Notes (Signed)
Nutrition Assessment for Bariatric Surgery   Appointment start time: 1750   end time: 1850  Planned Surgery: RNY Gastric bypass   Anthropometrics: Weight: 239.2lbs Height: 5'1" BMI: 45.2   Patient's Goal Weight: 150-180lbs  Clinical: Medical History: sleep apnea, cholelithiasis, insomnia, GERD Medications and Supplements: phentermine, loratadine, nuvaring, amitriptyline, acetaminophen prn, valerian root, multivitamins: Flintstones complete, Hair-skin-nails, emergen-c Relevant labs: Notable symptoms: GE reflux symptoms Drug allergies: none known Food allergies: none known  Lifestyle and Dietary History:  Dieting/ weight history:  Rickard Patience, weight watchers, cabbage soup diet, intermittent liquid diet, nutrisystem -- all worked only temporarily Had sleeve gastrectomy 2017 and lost to goal of , followed bariatric diet and incorporated regular exercise  Disordered or emotional eating history: no eating disorder, no emotional eating  Physical activity: walking or stationary bike 30-60 minutes sporadically  Dietary Recall:  Daily pattern: 2 meals and 5 snacks. Dining out: 1-2 meals per week. Breakfast: yogurt (since having gallstones) + orange juice; was eating eggs and [place] bacon  Snack: same as pm Lunch: sometimes skips due to work; Systems analyst; protein shake Snack: applesauce, saltine crackers when; yogurt; fruit ie orange or grape; occ popcorn Supper: grilled shrimp/ chicken + rice/ mashed potato + veg Snack: same as pm Beverages: water, orange juice, premier protein shake, frozen fruit with greek yogurt and ice smoothie   Nutrition Intervention: Instructed patient on pre-op diet goals and importance of close adherence to bariatric diet after surgery to avoid side effects and complications.  Discussed stages of the bariatric diet after surgery as well as the importance of adequate protein and fluid intake. Discussed options for tracking food intake. Provided overview of  2-week pre-op diet.  Instructed on portions of carbohydrate foods and encouraged limiting to 15g or less with each meal, and only after consuming protein source and low carb vegetables. Discussed roles of regular exercise, adequate sleep, and stress management on weight control.  Nutrition Diagnosis: [place]-3.3 Overweight/ obesity related to history of excess calories and inadequate physical activity as evidenced by patient with current BMI of 45.2, following dietary guidelines for weight loss prior to bariatric surgery revision.  Teaching method(s) utilized: Copy provided: Pre-op Goals Diet Stage Template Plate planner with food lists/ portions  Learning Readiness: Change in progress  Barriers to learning/ implementing lifestyle change: none  Demonstrated degree of understanding via: Teach Back  Summary: Patient has begun making diet and lifestyle changes in effort to lose weight and prepare for bariatric surgery.  Patient's personal goal weight is 150-180lbs. She has solid support from her mother and family.  She agrees to work on reducing carbohydrate intake, eating at regular intervals, and avoiding fluids during meals, prior to surgery.  She is motivated to follow the bariatric diet after surgery.  From a nutrition standpoint, she is ready to proceed with the bariatric surgery program and is a good candidate for surgery.   Plan: Patient commits to returning for  pre-op class prior to surgery.  She will plan to return for post-op RD visits beginning 2 weeks after surgery.

## 2021-03-29 ENCOUNTER — Telehealth: Payer: Self-pay | Admitting: *Deleted

## 2021-03-29 NOTE — Telephone Encounter (Signed)
Faxed FMLA to records to [facility] at [phone]

## 2021-04-02 ENCOUNTER — Other Ambulatory Visit: Payer: Self-pay

## 2021-04-02 ENCOUNTER — Encounter: Payer: Self-pay | Admitting: Obstetrics and Gynecology

## 2021-04-02 ENCOUNTER — Ambulatory Visit (INDEPENDENT_AMBULATORY_CARE_PROVIDER_SITE_OTHER): Payer: BC Managed Care – PPO | Admitting: Obstetrics and Gynecology

## 2021-04-02 VITALS — BP 112/68 | Ht 61.0 in | Wt 242.0 lb

## 2021-04-02 DIAGNOSIS — Z6841 Body Mass Index (BMI) 40.0 and over, adult: Secondary | ICD-10-CM

## 2021-04-02 MED ORDER — TIRZEPATIDE 2.5 MG/0.5ML ~~LOC~~ SOAJ: 2.5000 mg | SUBCUTANEOUS | 0 refills | Status: AC

## 2021-04-02 NOTE — Progress Notes (Signed)
Gynecology Office Visit  Chief Complaint:  Chief Complaint  Patient presents with   Follow-up    Medication F/U - no concerns. RM 4    History of Present Illness: Patientis a 35 y.o. G58P1011 female, who presents for the evaluation of the desire to lose weight. She has lost 0 pounds 1 months. The patient states the following symptoms since starting her weight loss therapy: appetite suppression, energy, and weight loss.  The patient also reports no other ill effects. The patient specifically denies heart palpitations, anxiety, and insomnia.   She has met with bariatrics and nutrition in the meantime.    Review of Systems: 10 point review of systems negative unless otherwise noted in HPI  Past Medical History:  Past Medical History:  Diagnosis Date   Allergic rhinitis    Encounter for insertion of mirena IUD    GERD (gastroesophageal reflux disease)    Gonorrhea    Herpes genitalia    High-risk pregnancy 01/17/2014   chorio/mild utering atony   History of hiatal hernia    History of syphilis    Obesity    Sleep apnea    Syphilis     Past Surgical History:  Past Surgical History:  Procedure Laterality Date   BREAST REDUCTION SURGERY Bilateral 11/02/2019   Procedure: MAMMARY REDUCTION  (BREAST);  Surgeon: Cindra Presume, MD;  Location: [facility];  Service: Plastics;  Laterality: Bilateral;  3 hours   FRACTURE SURGERY  2004   L left tibia   LAPAROSCOPIC GASTRIC SLEEVE RESECTION N/A 07/25/2015   Procedure: LAPAROSCOPIC GASTRIC SLEEVE RESECTION;  Surgeon: Excell Seltzer, MD;  Location: [facility];  Service: General;  Laterality: N/A;   LAPAROSCOPIC GASTRIC SLEEVE RESECTION      Gynecologic History: Patient's last menstrual period was 02/23/2021.  Obstetric History: G2P1011  Family History:  Family History  Problem Relation Age of Onset   Hypertension Father    Hypertension Maternal Grandmother    Diabetes Maternal Grandfather    Hypertension  Maternal Grandfather    Other Cousin        breast augmentation 2/2 to pain    Social History:  Social History   Socioeconomic History   Marital status: Married    Spouse name: Insurance claims handler   Number of children: 1   Years of education: college   Highest education level: Not on file  Occupational History   Occupation: AR specialist- Sport and exercise psychologist: [employer]    Comment: [employer]  Tobacco Use   Smoking status: Never   Smokeless tobacco: Never  Vaping Use   Vaping Use: Never used  Substance and Sexual Activity   Alcohol use: Yes    Alcohol/week: 0.0 standard drinks    Comment: not much now, just at holidays wine cooler   Drug use: No   Sexual activity: Yes    Birth control/protection: Inserts    Comment: Nuvaring  Other Topics Concern   Not on file  Social History Narrative   09/09/19   From: [place] - her mom moved her after [place] finished college   Living: with husband [name] and daughter [name]   Work: Medical illustrator - Insurance claims handler      Family: mom nearby      Enjoys: reading, spend time with daughter - swimming, dancing      Exercise: not currently, planning to start exercise plan with sister   Diet: intermittent fasting       Safety   Seat  belts: Yes    Guns: No   Safe in relationships: Yes    Social Determinants of Radio broadcast assistant Strain: Not on file  Food Insecurity: Not on file  Transportation Needs: Not on file  Physical Activity: Not on file  Stress: Not on file  Social Connections: Not on file  Intimate Partner Violence: Not on file    Allergies:  No Known Allergies  Medications: Prior to Admission medications   Medication Sig Start Date End Date Taking? Authorizing Provider  phentermine (ADIPEX-P) 37.5 MG tablet Take 1 tablet (37.5 mg total) by mouth daily before breakfast. 03/02/21  Yes [name], MD  acetaminophen (TYLENOL) 500 MG tablet Take 500 mg by mouth every 6 (six) hours as needed for mild pain.     [provider]  acetaminophen (TYLENOL) 500 MG tablet Take 2 tablets (1,000 mg total) by mouth every 6 (six) hours as needed for mild pain. 01/25/21   Olean Ree, MD  amitriptyline (ELAVIL) 100 MG tablet Take 100 mg by mouth at bedtime.    [provider]  etonogestrel-ethinyl estradiol (NUVARING) 0.12-0.015 MG/24HR vaginal ring Insert vaginally and leave in place for 3 consecutive weeks, then remove for 1 week. Patient taking differently: Place 1 each vaginally See admin instructions. Insert vaginally and leave in place for 3 consecutive weeks, then remove for 1 week. 09/01/20   Imagene Riches, CNM  loratadine (CLARITIN) 10 MG tablet Take 10 mg by mouth 2 (two) times a week.    [provider]  Multiple Vitamins-Minerals (EMERGEN-C IMMUNE PO) Take 3 capsules by mouth at bedtime. With vitamin D    [provider]  Multiple Vitamins-Minerals (HAIR SKIN NAILS PO) Take 2 capsules by mouth daily with lunch. Billey Chang, RN  Pediatric Multiple Vitamins (FLINTSTONES MULTIVITAMIN PO) Take 2 tablets by mouth daily. Gummies    [provider]  valACYclovir (VALTREX) 500 MG tablet Take 500 mg by mouth daily as needed. 03/24/21   [provider]  VALERIAN PO Take by mouth.    [provider]    Physical Exam Blood pressure 112/68, height $RemoveBefore'5\' 1"'[id number]$  (1.549 m), weight 242 lb (109.8 kg), last menstrual period 02/23/2021. Wt Readings from Last 3 Encounters:  04/02/21 242 lb (109.8 kg)  03/12/21 239 lb 3.2 oz (108.5 kg)  03/02/21 242 lb (109.8 kg)  Body mass index is 45.73 kg/m.   General: NAD HEENT: normocephalic, anicteric Thyroid: no enlargement Pulmonary: no increased work of breathing Neurologic: Grossly intact Psychiatric: mood appropriate, affect full  Assessment: 35 y.o. G2P1011 medical weight loss follow up  Plan: Problem List Items Addressed This Visit       Other   Obesity - Primary   Relevant Medications    tirzepatide (MOUNJARO) 2.5 MG/0.5ML Pen    1) 1500 Calorie ADA Diet  2) Patient education given regarding appropriate lifestyle changes for weight loss including: regular physical activity, healthy coping strategies, caloric restriction and healthy eating patterns.  3) Discussed continuing phentermine, trial of diethylpropion or trial of tirzepitide   4) Patient to take medication, with the benefits of appetite suppression and metabolism boost d/w pt, along with the side effects and risk factors of long term use that will be avoided with our use of short bursts of therapy. Rx provided.    5) 15 minutes face-to-face; with counseling/coordination of care > 50 percent of visit related to obesity and ongoing management/treatment   6) Return in about 4  weeks (around 04/30/2021) for medication follow up.    [name], MD, [facility], [facility] 04/02/2021, 8:22 AM

## 2021-04-30 ENCOUNTER — Other Ambulatory Visit: Payer: Self-pay | Admitting: Surgery

## 2021-05-03 ENCOUNTER — Ambulatory Visit: Payer: BC Managed Care – PPO | Admitting: Obstetrics and Gynecology

## 2021-05-16 ENCOUNTER — Other Ambulatory Visit: Payer: Self-pay

## 2021-05-16 ENCOUNTER — Ambulatory Visit
Admission: RE | Admit: 2021-05-16 | Discharge: 2021-05-16 | Disposition: A | Discharge status: Home / Self Care | Payer: BC Managed Care – PPO | Source: Ambulatory Visit | Attending: Surgery | Admitting: Surgery

## 2021-05-17 ENCOUNTER — Ambulatory Visit (INDEPENDENT_AMBULATORY_CARE_PROVIDER_SITE_OTHER): Payer: BC Managed Care – PPO | Admitting: Obstetrics and Gynecology

## 2021-05-17 VITALS — Ht 61.0 in | Wt 236.0 lb

## 2021-05-17 DIAGNOSIS — Z6841 Body Mass Index (BMI) 40.0 and over, adult: Secondary | ICD-10-CM | POA: Diagnosis not present

## 2021-05-17 MED ORDER — PHENTERMINE HCL 37.5 MG PO TABS: 37.5000 mg | ORAL_TABLET | Freq: Every day | ORAL | 0 refills | Status: DC

## 2021-05-17 NOTE — Progress Notes (Signed)
.I connected with Nancy Owen on 05/17/21 at  3:10 PM EDT by telephone and verified that I am speaking with the correct person using two identifiers.   I discussed the limitations, risks, security and privacy concerns of performing an evaluation and management service by telephone and the availability of in person appointments. I also discussed with the patient that there may be a patient responsible charge related to this service. The patient expressed understanding and agreed to proceed.  The patient was at home I spoke with the patient from my workstation phone The names of people involved in this encounter were: Nancy Owen , and Nancy Owen   Gynecology Office Visit  Chief Complaint:  Chief Complaint  Patient presents with   Follow-up    Phone visit - weight loss.     History of Present Illness: Patientis a 35 y.o. G71P1011 female, who presents for the evaluation of the desire to lose weight. She has lost 6 pounds 1 months. The patient states the following symptoms since starting her weight loss therapy: appetite suppression, energy, and weight loss.  The patient also reports no other ill effects. The patient specifically denies heart palpitations, anxiety, and insomnia.    Review of Systems: 10 point review of systems negative unless otherwise noted in HPI  Past Medical History:  Past Medical History:  Diagnosis Date   Allergic rhinitis    Encounter for insertion of mirena IUD    GERD (gastroesophageal reflux disease)    Gonorrhea    Herpes genitalia    High-risk pregnancy 01/17/2014   chorio/mild utering atony   History of hiatal hernia    History of syphilis    Obesity    Sleep apnea    Syphilis     Past Surgical History:  Past Surgical History:  Procedure Laterality Date   BREAST REDUCTION SURGERY Bilateral 11/02/2019   Procedure: MAMMARY REDUCTION  (BREAST);  Surgeon: Cindra Presume, MD;  Location: [facility];  Service:  Plastics;  Laterality: Bilateral;  3 hours   FRACTURE SURGERY  2004   L left tibia   LAPAROSCOPIC GASTRIC SLEEVE RESECTION N/A 07/25/2015   Procedure: LAPAROSCOPIC GASTRIC SLEEVE RESECTION;  Surgeon: Excell Seltzer, MD;  Location: [facility];  Service: General;  Laterality: N/A;   LAPAROSCOPIC GASTRIC SLEEVE RESECTION      Gynecologic History: No LMP recorded.  Obstetric History: G2P1011  Family History:  Family History  Problem Relation Age of Onset   Hypertension Father    Hypertension Maternal Grandmother    Diabetes Maternal Grandfather    Hypertension Maternal Grandfather    Other Cousin        breast augmentation 2/2 to pain    Social History:  Social History   Socioeconomic History   Marital status: Married    Spouse name: Nancy Owen   Number of children: 1   Years of education: college   Highest education level: Not on file  Occupational History   Occupation: Nancy Owen: Nancy Owen    Comment: Nancy Owen  Tobacco Use   Smoking status: Never   Smokeless tobacco: Never  Vaping Use   Vaping Use: Never used  Substance and Sexual Activity   Alcohol use: Yes    Alcohol/week: 0.0 standard drinks    Comment: not much now, just at holidays wine cooler   Drug use: No   Sexual activity: Yes    Birth control/protection: Inserts    Comment: Nuvaring  Other Topics  Concern   Not on file  Social History Narrative   09/09/19   From: [place] - her [place] Owen moved her after [place] finished college   Living: with husband Nancy Owen and daughter Nancy Owen   Work: Medical illustrator - Nancy Owen      Family: Nancy Owen nearby      Enjoys: reading, spend time with daughter - swimming, dancing      Exercise: not currently, planning to start exercise plan with sister   Diet: intermittent fasting       Safety   Seat belts: Yes    Guns: No   Safe in relationships: Yes    Social Determinants of Radio broadcast assistant Strain: Not on file  Food Insecurity: Not  on file  Transportation Needs: Not on file  Physical Activity: Not on file  Stress: Not on file  Social Connections: Not on file  Intimate Partner Violence: Not on file    Allergies:  No Known Allergies  Medications: Prior to Admission medications   Medication Sig Start Date End Date Taking? Authorizing Nancy Owen  amitriptyline (ELAVIL) 100 MG tablet Take 100 mg by mouth at bedtime.   Yes Nancy Owen, Historical, MD  amoxicillin (AMOXIL) 500 MG capsule Take by mouth. 05/11/21 05/29/21 Yes Nancy Owen, Historical, MD  clarithromycin (BIAXIN) 500 MG tablet Take by mouth. 05/11/21 05/29/21 Yes Nancy Owen, Historical, MD  etonogestrel-ethinyl estradiol (NUVARING) 0.12-0.015 MG/24HR vaginal ring Insert vaginally and leave in place for 3 consecutive weeks, then remove for 1 week. Patient taking differently: Place 1 each vaginally See admin instructions. Insert vaginally and leave in place for 3 consecutive weeks, then remove for 1 week. 09/01/20  Yes Nancy Owen, CNM  loratadine (CLARITIN) 10 MG tablet Take 10 mg by mouth 2 (two) times a week.   Yes Nancy Owen, Historical, MD  Multiple Vitamins-Minerals (EMERGEN-C IMMUNE PO) Take 3 capsules by mouth at bedtime. With vitamin D   Yes Nancy Owen, Historical, MD  omeprazole (PRILOSEC OTC) 20 MG tablet Take by mouth. 05/11/21 05/29/21 Yes Nancy Owen, Historical, MD  acetaminophen (TYLENOL) 500 MG tablet Take 500 mg by mouth every 6 (six) hours as needed for mild pain.    Nancy Owen, Historical, MD  acetaminophen (TYLENOL) 500 MG tablet Take 2 tablets (1,000 mg total) by mouth every 6 (six) hours as needed for mild pain. 01/25/21   Nancy Ree, MD  Multiple Vitamins-Minerals (HAIR SKIN NAILS PO) Take 2 capsules by mouth daily with lunch. Nancy Chang, RN  Pediatric Multiple Vitamins (FLINTSTONES MULTIVITAMIN PO) Take 2 tablets by mouth daily. Gummies    Nancy Owen, Historical, MD  phentermine (ADIPEX-P) 37.5 MG tablet Take 1 tablet (37.5 mg total) by  mouth daily before breakfast. 03/02/21   Nancy Mood, MD  valACYclovir (VALTREX) 500 MG tablet Take 500 mg by mouth daily as needed. 03/24/21   Nancy Owen, Historical, MD  VALERIAN PO Take by mouth.    Nancy Owen, Historical, MD    Physical Exam Height 5\' 1"  (1.549 m), weight 236 lb (107 kg). Wt Readings from Last 3 Encounters:  05/17/21 236 lb (107 kg)  04/02/21 242 lb (109.8 kg)  03/12/21 239 lb 3.2 oz (108.5 kg)  Body mass index is 44.59 kg/m.  No physical exam as this was a remote telephone visit to promote social distancing during the current COVID-19 Pandemic   Assessment: 35 y.o. [id number] medical weight loss follow up   Plan: Problem List Items Addressed This Visit       Other  Obesity - Primary   Relevant Medications   phentermine (ADIPEX-P) 37.5 MG tablet    1) 1500 Calorie ADA Diet  2) Patient education given regarding appropriate lifestyle changes for weight loss including: regular physical activity, healthy coping strategies, caloric restriction and healthy eating patterns.  3) Patient will be started on weight loss medication. The risks and benefits and side effects of medication, such as Adipex (Phenteramine) ,  Tenuate (Diethylproprion), Belviq (lorcarsin), Contrave (buproprion/naltrexone), Qsymia (phentermine/topiramate), and Saxenda (liraglutide) is discussed. The pros and cons of suppressing appetite and boosting metabolism is discussed. Risks of tolerence and addiction is discussed for selected agents discussed. Use of medicine will ne short term, such as 3-4 months at a time followed by a period of time off of the medicine to avoid these risks and side effects for Adipex, Qsymia, and Tenuate discussed. Pt to call with any negative side effects and agrees to keep follow up appts.  4) Patient to take medication, with the benefits of appetite suppression and metabolism boost d/w pt, along with the side effects and risk factors of long term use that will be avoided  with our use of short bursts of therapy. Rx provided.    5) Telephone time 5:4min  6) Return in about 4 weeks (around 06/14/2021).    Nancy Mood, MD, [facility], [facility] 05/17/2021, 3:31 PM

## 2021-06-12 ENCOUNTER — Ambulatory Visit (INDEPENDENT_AMBULATORY_CARE_PROVIDER_SITE_OTHER): Payer: BC Managed Care – PPO | Admitting: Psychology

## 2021-06-14 ENCOUNTER — Other Ambulatory Visit: Payer: Self-pay

## 2021-06-14 ENCOUNTER — Ambulatory Visit (INDEPENDENT_AMBULATORY_CARE_PROVIDER_SITE_OTHER): Payer: BC Managed Care – PPO | Admitting: Obstetrics and Gynecology

## 2021-06-14 ENCOUNTER — Encounter: Payer: Self-pay | Admitting: Obstetrics and Gynecology

## 2021-06-14 DIAGNOSIS — Z6841 Body Mass Index (BMI) 40.0 and over, adult: Secondary | ICD-10-CM | POA: Diagnosis not present

## 2021-06-14 MED ORDER — PHENTERMINE HCL 37.5 MG PO TABS: 37.5000 mg | ORAL_TABLET | Freq: Every day | ORAL | 0 refills | Status: DC

## 2021-06-14 NOTE — Progress Notes (Signed)
I connected with Nancy Owen  06/14/21 at 11:10 AM EST by telephone and verified that I am speaking with the correct person using two identifiers.   I discussed the limitations, risks, security and privacy concerns of performing an evaluation and management service by telephone and the availability of in person appointments. I also discussed with the patient that there may be a patient responsible charge related to this service. The patient expressed understanding and agreed to proceed.  The patient was at home I spoke with the patient from my workstation phone The names of people involved in this encounter were: Nancy Owen , and Nancy Owen   Gynecology Office Visit  Chief Complaint:  Chief Complaint  Patient presents with   Follow-up    Phone visit - weight loss, no concerns.    History of Present Illness: Patientis a 35 y.o. G75P1011 female, who presents for the evaluation of the desire to lose weight. She has lost 6 pounds 1 months. The patient states the following symptoms since starting her weight loss therapy: appetite suppression, energy, and weight loss.  The patient also reports no other ill effects. The patient specifically denies heart palpitations, anxiety, and insomnia.    Review of Systems: 10 point review of systems negative unless otherwise noted in HPI  Past Medical History:  Past Medical History:  Diagnosis Date   Allergic rhinitis    Encounter for insertion of mirena IUD    GERD (gastroesophageal reflux disease)    Gonorrhea    Herpes genitalia    High-risk pregnancy 01/17/2014   chorio/mild utering atony   History of hiatal hernia    History of syphilis    Obesity    Sleep apnea    Syphilis     Past Surgical History:  Past Surgical History:  Procedure Laterality Date   BREAST REDUCTION SURGERY Bilateral 11/02/2019   Procedure: MAMMARY REDUCTION  (BREAST);  Surgeon: Cindra Presume, MD;  Location: [facility];   Service: Plastics;  Laterality: Bilateral;  3 hours   FRACTURE SURGERY  2004   L left tibia   LAPAROSCOPIC GASTRIC SLEEVE RESECTION N/A 07/25/2015   Procedure: LAPAROSCOPIC GASTRIC SLEEVE RESECTION;  Surgeon: Excell Seltzer, MD;  Location: [facility];  Service: General;  Laterality: N/A;   LAPAROSCOPIC GASTRIC SLEEVE RESECTION      Gynecologic History: Patient's last menstrual period was 04/30/2021.  Obstetric History: G2P1011  Family History:  Family History  Problem Relation Age of Onset   Hypertension Father    Hypertension Maternal Grandmother    Diabetes Maternal Grandfather    Hypertension Maternal Grandfather    Other Cousin        breast augmentation 2/2 to pain    Social History:  Social History   Socioeconomic History   Marital status: Married    Spouse name: Insurance claims handler   Number of children: 1   Years of education: college   Highest education level: Not on file  Occupational History   Occupation: AR specialist- Sport and exercise psychologist: [employer]    Comment: [employer]  Tobacco Use   Smoking status: Never   Smokeless tobacco: Never  Vaping Use   Vaping Use: Never used  Substance and Sexual Activity   Alcohol use: Yes    Alcohol/week: 0.0 standard drinks    Comment: not much now, just at holidays wine cooler   Drug use: No   Sexual activity: Yes    Birth control/protection: Inserts    Comment: Nuvaring  Other Topics Concern   Not on file  Social History Narrative   09/09/19   From: [place] - her mom moved her after [place] finished college   Living: with husband Nancy [place] and daughter Nancy Owen   Work: Medical illustrator - Insurance claims handler      Family: mom nearby      Enjoys: reading, spend time with daughter - swimming, dancing      Exercise: not currently, planning to start exercise plan with sister   Diet: intermittent fasting       Safety   Seat belts: Yes    Guns: No   Safe in relationships: Yes    Social Determinants of Radio broadcast assistant  Strain: Not on file  Food Insecurity: Not on file  Transportation Needs: Not on file  Physical Activity: Not on file  Stress: Not on file  Social Connections: Not on file  Intimate Partner Violence: Not on file    Allergies:  No Known Allergies  Medications: Prior to Admission medications   Medication Sig Start Date End Date Taking? Authorizing Provider  amitriptyline (ELAVIL) 100 MG tablet Take 100 mg by mouth at bedtime.   Yes [provider]  Multiple Vitamins-Minerals (EMERGEN-C IMMUNE PO) Take 3 capsules by mouth at bedtime. With vitamin D   Yes [provider]  Multiple Vitamins-Minerals (HAIR SKIN NAILS PO) Take 2 capsules by mouth daily with lunch. Gummie   Yes Joline Salt, RN  Pediatric Multiple Vitamins (FLINTSTONES MULTIVITAMIN PO) Take 2 tablets by mouth daily. Gummies   Yes [provider]  phentermine (ADIPEX-P) 37.5 MG tablet Take 1 tablet (37.5 mg total) by mouth daily before breakfast. 05/17/21  Yes Nancy Mood, MD  VALERIAN PO Take by mouth.   Yes [provider]  acetaminophen (TYLENOL) 500 MG tablet Take 500 mg by mouth every 6 (six) hours as needed for mild pain.    [provider]  acetaminophen (TYLENOL) 500 MG tablet Take 2 tablets (1,000 mg total) by mouth every 6 (six) hours as needed for mild pain. 01/25/21   Olean Ree, MD  etonogestrel-ethinyl estradiol (NUVARING) 0.12-0.015 MG/24HR vaginal ring Insert vaginally and leave in place for 3 consecutive weeks, then remove for 1 week. Patient taking differently: Place 1 each vaginally See admin instructions. Insert vaginally and leave in place for 3 consecutive weeks, then remove for 1 week. 09/01/20   Imagene Riches, CNM  loratadine (CLARITIN) 10 MG tablet Take 10 mg by mouth 2 (two) times a week.    [provider]  omeprazole (PRILOSEC OTC) 20 MG tablet Take by mouth. 05/11/21 05/29/21  [provider]  valACYclovir (VALTREX) 500 MG tablet  Take 500 mg by mouth daily as needed. 03/24/21   [provider]    Physical Exam Height 5\' 1"  (1.549 m), weight 230 lb (104.3 kg), last menstrual period 04/30/2021. Wt Readings from Last 3 Encounters:  06/14/21 230 lb (104.3 kg)  05/17/21 236 lb (107 kg)  04/02/21 242 lb (109.8 kg)  Body mass index is 43.46 kg/m.  No physical exam as this was a remote telephone visit to promote social distancing during the current COVID-19 Pandemic   Assessment: 35 y.o. G2P1011 medication follow up weight   Plan: Problem List Items Addressed This Visit       Other   Obesity - Primary   Relevant Medications   phentermine (ADIPEX-P) 37.5 MG tablet    1) 1500 Calorie ADA Diet  2) Patient education given  regarding appropriate lifestyle changes for weight loss including: regular physical activity, healthy coping strategies, caloric restriction and healthy eating patterns.  3) Patient will be started on weight loss medication. The risks and benefits and side effects of medication, such as Adipex (Phenteramine) ,  Tenuate (Diethylproprion), Belviq (lorcarsin), Contrave (buproprion/naltrexone), Qsymia (phentermine/topiramate), and Saxenda (liraglutide) is discussed. The pros and cons of suppressing appetite and boosting metabolism is discussed. Risks of tolerence and addiction is discussed for selected agents discussed. Use of medicine will ne short term, such as 3-4 months at a time followed by a period of time off of the medicine to avoid these risks and side effects for Adipex, Qsymia, and Tenuate discussed. Pt to call with any negative side effects and agrees to keep follow up appts.  4) Patient to take medication, with the benefits of appetite suppression and metabolism boost d/w pt, along with the side effects and risk factors of long term use that will be avoided with our use of short bursts of therapy. Rx provided.    5) Telephone time 5:41min  6) Return in about 4 weeks (around  07/12/2021) for medication follow up.    Nancy Mood, MD, [facility], [facility] 06/14/2021, 11:17 AM

## 2021-06-21 ENCOUNTER — Telehealth: Payer: Self-pay | Admitting: Family Medicine

## 2021-06-21 DIAGNOSIS — G4733 Obstructive sleep apnea (adult) (pediatric): Secondary | ICD-10-CM

## 2021-06-21 NOTE — Telephone Encounter (Signed)
Mychart sent to pt.

## 2021-06-21 NOTE — Telephone Encounter (Signed)
Pulmonology manages CPAP machine settings and supplies.  If she is not seeing pulmonology or neurology for OSA then needs to be referred.  I will place a referral for pulmonology to manage CPAP. [facility] or [facility]?

## 2021-06-21 NOTE — Telephone Encounter (Signed)
Pt called in wants to know if she need's a referral or a new sleep apnea machine . Please Advise [phone]

## 2021-06-25 ENCOUNTER — Ambulatory Visit: Payer: BC Managed Care – PPO | Admitting: Psychology

## 2021-06-25 NOTE — Telephone Encounter (Signed)
Pt replied to [employer] and I sent reply to Allie Bossier.

## 2021-07-10 ENCOUNTER — Telehealth: Payer: Self-pay

## 2021-07-10 NOTE — Telephone Encounter (Signed)
Pt states she has a yeast infection and want to know if [facility] could call in a diflucan.

## 2021-07-11 ENCOUNTER — Other Ambulatory Visit: Payer: Self-pay | Admitting: Obstetrics

## 2021-07-11 DIAGNOSIS — N898 Other specified noninflammatory disorders of vagina: Secondary | ICD-10-CM

## 2021-07-11 MED ORDER — FLUCONAZOLE 150 MG PO TABS: 150.0000 mg | ORAL_TABLET | Freq: Once | ORAL | 0 refills | Status: AC

## 2021-07-11 NOTE — Progress Notes (Signed)
Patient contacted the office  requesting diflucan for a suspected yeast infection. Rx sent to pharmacy for her.  Imagene Riches, CNM  07/11/2021 10:59 AM

## 2021-07-19 ENCOUNTER — Telehealth: Payer: Self-pay

## 2021-07-19 NOTE — Telephone Encounter (Signed)
Would like to know if patient has ever had a sleep study. If so where?

## 2021-07-23 ENCOUNTER — Encounter: Payer: Self-pay | Admitting: Adult Health

## 2021-07-23 ENCOUNTER — Other Ambulatory Visit: Payer: Self-pay

## 2021-07-23 ENCOUNTER — Ambulatory Visit (INDEPENDENT_AMBULATORY_CARE_PROVIDER_SITE_OTHER): Payer: BC Managed Care – PPO | Admitting: Adult Health

## 2021-07-23 VITALS — BP 110/70 | HR 102 | Temp 98.2°F | Ht 61.0 in | Wt 232.6 lb

## 2021-07-23 DIAGNOSIS — G4733 Obstructive sleep apnea (adult) (pediatric): Secondary | ICD-10-CM | POA: Diagnosis not present

## 2021-07-23 NOTE — Patient Instructions (Addendum)
Set up for home sleep study.  Healthy sleep regimen.  Work on healthy weight loss.  Follow up in 6-8 weeks to discuss results and As needed

## 2021-07-23 NOTE — Assessment & Plan Note (Signed)
History of moderate obstructive sleep apnea.  Patient with significant weight loss.  Has been off of CPAP for almost 4 years now.  She has ongoing symptoms suspicious for sleep apnea with snoring, restless sleep, daytime sleepiness and BMI is at 43.  We will set up for home sleep study. - discussed how weight can impact sleep and risk for sleep disordered breathing - discussed options to assist with weight loss: combination of diet modification, cardiovascular and strength training exercises   - had an extensive discussion regarding the adverse health consequences related to untreated sleep disordered breathing - specifically discussed the risks for hypertension, coronary artery disease, cardiac dysrhythmias, cerebrovascular disease, and diabetes - lifestyle modification discussed   - discussed how sleep disruption can increase risk of accidents, particularly when driving - safe driving practices were discussed    Plan  Patient Instructions  Set up for home sleep study.  Healthy sleep regimen.  Work on healthy weight loss.  Follow up in 6-8 weeks to discuss results and As needed

## 2021-07-23 NOTE — Assessment & Plan Note (Signed)
Healthy weight loss 

## 2021-07-23 NOTE — Progress Notes (Signed)
@Patient  ID: Nancy Owen, female    DOB: 07-20-1985, 36 y.o.   MRN: [id number]  Chief Complaint  Patient presents with   Consult    Referring provider: [name], MD  HPI: 36 year old female seen for sleep consult July 23, 2021 for snoring, restless sleep, daytime sleepiness, previous diagnosis of sleep apnea   TEST/EVENTS :  Sleep study September 12, 2011 showed moderate sleep apnea with AHI at 19.7/hour  07/23/2021 Sleep consult :  Patient presents for a sleep consult.  Patient complains of loud snoring, restless sleep, daytime sleepiness.  Typically goes to bed about 10 PM.  Takes her a long time to fall asleep.  Is up several times throughout the night.  And gets up about 6 AM.  She does not operate heavy machinery.  Weight has been up about 15 to 20 pounds over the last 2 years.  Patient has had a previous sleep study and diagnosed with sleep apnea several years ago.  In 2013 sleep study showed moderate sleep apnea with AHI at 19.7/hour.  Patient did use CPAP for about 4 years. Lost 180lbs with gastric sleeve procedure. Has been off CPAP since 2019. However has gained 90lbs back.    Patient says that she is having symptoms again and wants to be rechecked for sleep apnea.  Epworth score is 14. No symptoms of cataplexy or sleep paralysis.  No caffeine.  No energy drinks.  Currently on Phentermine for weight loss.  No history of CHF or CVA.  Watches some TV. On screen time a lot with her phone.   Former patient of Dr. Gwenette Greet, titration study in past showed optimal control CPAP 16cm  H2O.   Social history: Patient is married.  Does have 1 child   Drinks on occasion.  Is never smoker.  She works as an [employer] fast [employer].    Surgical history: Gallbladder out in 2022, breast surgery in April 2021., gastrics sleeve 2017   Medical history significant for chronic allergies and headaches.  Family history positive for breast cancer in an aunt.   No Known  Allergies  Immunization History  Administered Date(s) Administered   Influenza Split 05/15/2013   Influenza, Seasonal, Injecte, Preservative Fre 06/24/2012   Influenza,inj,Quad PF,6+ Mos 05/09/2017, 05/09/2020   PFIZER(Purple Top)SARS-COV-2 Vaccination 10/22/2019, 11/17/2019   PPD Test 12/15/2012    Past Medical History:  Diagnosis Date   Allergic rhinitis    Encounter for insertion of mirena IUD    GERD (gastroesophageal reflux disease)    Gonorrhea    Herpes genitalia    High-risk pregnancy 01/17/2014   chorio/mild utering atony   History of hiatal hernia    History of syphilis    Obesity    Sleep apnea    Syphilis     Tobacco History: Social History   Tobacco Use  Smoking Status Never  Smokeless Tobacco Never   Counseling given: Not Answered   Outpatient Medications Prior to Visit  Medication Sig Dispense Refill   acetaminophen (TYLENOL) 500 MG tablet Take 500 mg by mouth every 6 (six) hours as needed for mild pain.     acetaminophen (TYLENOL) 500 MG tablet Take 2 tablets (1,000 mg total) by mouth every 6 (six) hours as needed for mild pain.     amitriptyline (ELAVIL) 100 MG tablet Take 100 mg by mouth at bedtime.     calcium carbonate (TUMS - DOSED IN MG ELEMENTAL CALCIUM) 500 MG chewable tablet Chew 1 tablet by mouth daily.  etonogestrel-ethinyl estradiol (NUVARING) 0.12-0.015 MG/24HR vaginal ring Insert vaginally and leave in place for 3 consecutive weeks, then remove for 1 week. (Patient taking differently: Place 1 each vaginally See admin instructions. Insert vaginally and leave in place for 3 consecutive weeks, then remove for 1 week.) 1 each 11   loratadine (CLARITIN) 10 MG tablet Take 10 mg by mouth 2 (two) times a week.     Multiple Vitamins-Minerals (EMERGEN-C IMMUNE PO) Take 3 capsules by mouth at bedtime. With vitamin D     Multiple Vitamins-Minerals (HAIR SKIN NAILS PO) Take 2 capsules by mouth daily with lunch. Gummie     Pediatric Multiple Vitamins  (FLINTSTONES MULTIVITAMIN PO) Take 2 tablets by mouth daily. Gummies     phentermine (ADIPEX-P) 37.5 MG tablet Take 1 tablet (37.5 mg total) by mouth daily before breakfast. 30 tablet 0   valACYclovir (VALTREX) 500 MG tablet Take 500 mg by mouth daily as needed.     VALERIAN PO Take by mouth.     omeprazole (PRILOSEC OTC) 20 MG tablet Take by mouth.     No facility-administered medications prior to visit.     Review of Systems:   Constitutional:   No  weight loss, night sweats,  Fevers, chills,  +fatigue, or  lassitude.  HEENT:   No headaches,  Difficulty swallowing,  Tooth/dental problems, or  Sore throat,                No sneezing, itching, ear ache, nasal congestion, post nasal drip,   CV:  No chest pain,  Orthopnea, PND, swelling in lower extremities, anasarca, dizziness, palpitations, syncope.   GI  No heartburn, indigestion, abdominal pain, nausea, vomiting, diarrhea, change in bowel habits, loss of appetite, bloody stools.   Resp: No shortness of breath with exertion or at rest.  No excess mucus, no productive cough,  No non-productive cough,  No coughing up of blood.  No change in color of mucus.  No wheezing.  No chest wall deformity  Skin: no rash or lesions.  GU: no dysuria, change in color of urine, no urgency or frequency.  No flank pain, no hematuria   MS:  No joint pain or swelling.  No decreased range of motion.  No back pain.    Physical Exam  BP 110/70 (BP Location: Left Arm, Patient Position: Sitting, Cuff Size: Normal)    Pulse (!) 102    Temp 98.2 F (36.8 C) (Oral)    Ht 5\' 1"  (1.549 m)    Wt 232 lb 9.6 oz (105.5 kg)    BMI 43.95 kg/m   GEN: A/Ox3; pleasant , NAD, well nourished    HEENT:  [place]/AT,  EACs-clear, TMs-wnl, NOSE-clear, THROAT-clear, no lesions, no postnasal drip or exudate noted. Class 2-3 MP airway   NECK:  Supple w/ fair ROM; no JVD; normal carotid impulses w/o bruits; no thyromegaly or nodules palpated; no lymphadenopathy.    RESP   Clear  P & A; w/o, wheezes/ rales/ or rhonchi. no accessory muscle use, no dullness to percussion  CARD:  RRR, no m/r/g, no peripheral edema, pulses intact, no cyanosis or clubbing.  GI:   Soft & nt; nml bowel sounds; no organomegaly or masses detected.   Musco: Warm bil, no deformities or joint swelling noted.   Neuro: alert, no focal deficits noted.    Skin: Warm, no lesions or rashes    Lab Results:    BNP No results found for: BNP  ProBNP No results found for:  PROBNP  Imaging: No results found.    No flowsheet data found.  No results found for: NITRICOXIDE      Assessment & Plan:   OSA (obstructive sleep apnea) History of moderate obstructive sleep apnea.  Patient with significant weight loss.  Has been off of CPAP for almost 4 years now.  She has ongoing symptoms suspicious for sleep apnea with snoring, restless sleep, daytime sleepiness and BMI is at 43.  We will set up for home sleep study. - discussed how weight can impact sleep and risk for sleep disordered breathing - discussed options to assist with weight loss: combination of diet modification, cardiovascular and strength training exercises   - had an extensive discussion regarding the adverse health consequences related to untreated sleep disordered breathing - specifically discussed the risks for hypertension, coronary artery disease, cardiac dysrhythmias, cerebrovascular disease, and diabetes - lifestyle modification discussed   - discussed how sleep disruption can increase risk of accidents, particularly when driving - safe driving practices were discussed    Plan  Patient Instructions  Set up for home sleep study.  Healthy sleep regimen.  Work on healthy weight loss.  Follow up in 6-8 weeks to discuss results and As needed         Morbid obesity with body mass index of 40.0-49.9 (HCC) Healthy weight loss.      Rexene Edison, NP 07/23/2021

## 2021-07-23 NOTE — Progress Notes (Signed)
Reviewed and agree with assessment/plan.   Chesley Mires, MD [facility] Pulmonary/Critical Care 07/23/2021, 5:12 PM Pager:  [phone]

## 2021-08-27 ENCOUNTER — Ambulatory Visit: Payer: BC Managed Care – PPO

## 2021-08-27 ENCOUNTER — Other Ambulatory Visit: Payer: Self-pay

## 2021-08-27 DIAGNOSIS — G4733 Obstructive sleep apnea (adult) (pediatric): Secondary | ICD-10-CM

## 2021-08-29 DIAGNOSIS — G4733 Obstructive sleep apnea (adult) (pediatric): Secondary | ICD-10-CM | POA: Diagnosis not present

## 2021-08-31 ENCOUNTER — Telehealth: Payer: Self-pay | Admitting: Adult Health

## 2021-08-31 NOTE — Telephone Encounter (Signed)
Home sleep study done on August 27, 2021 showed moderate obstructive sleep apnea with a AHI at 25.7/hour and SPO2 low at 76%  Please set up office visit or video visit to review sleep study results and discuss treatment options.  May double book my schedule next week

## 2021-08-31 NOTE — Telephone Encounter (Signed)
Lm for patient.  

## 2021-09-03 NOTE — Telephone Encounter (Signed)
Mychart visit scheduled 09/04/2021 at 2:00. Patient is aware and voiced her understanding.  Nothing further needed.

## 2021-09-04 ENCOUNTER — Telehealth (INDEPENDENT_AMBULATORY_CARE_PROVIDER_SITE_OTHER): Payer: BC Managed Care – PPO | Admitting: Adult Health

## 2021-09-04 ENCOUNTER — Other Ambulatory Visit: Payer: Self-pay

## 2021-09-04 ENCOUNTER — Encounter: Payer: Self-pay | Admitting: Adult Health

## 2021-09-04 DIAGNOSIS — G4733 Obstructive sleep apnea (adult) (pediatric): Secondary | ICD-10-CM | POA: Diagnosis not present

## 2021-09-04 NOTE — Addendum Note (Signed)
Addended by: Vanessa Barbara on: 09/04/2021 02:24 PM   Modules accepted: Orders

## 2021-09-04 NOTE — Progress Notes (Signed)
Virtual Visit via Video Note  I connected with Nancy Owen on 09/04/21 at  2:00 PM EST by a video enabled telemedicine application and verified that I am speaking with the correct person using two identifiers.  Location: Patient: Home  Provider: Office    I discussed the limitations of evaluation and management by telemedicine and the availability of in person appointments. The patient expressed understanding and agreed to proceed.  History of Present Illness: 36 year old female seen for sleep consult July 23, 2021 for ongoing snoring and daytime sleepiness and to reestablish for sleep apnea She was initially diagnosed with sleep apnea in 2013 with sleep study showing moderate sleep apnea with AHI at 19.7/hour.  She was on CPAP for about 4 years.  She lost 180 pounds with gastric sleeve procedure.  And has been off of CPAP since 2019.  She has gained 90 pounds back.  And started having more daytime sleepiness, snoring.  She was set up for a home sleep study completed on August 27, 2021 that showed moderate sleep apnea with AHI at 25.7/hour and SPO2 low at 76%. We discussed sleep study results.  Went over treatment options including weight loss oral appliance and CPAP.  Patient would like to proceed with CPAP therapy.  Patient education was given  Past Medical History:  Diagnosis Date   Allergic rhinitis    Encounter for insertion of mirena IUD    GERD (gastroesophageal reflux disease)    Gonorrhea    Herpes genitalia    High-risk pregnancy 01/17/2014   chorio/mild utering atony   History of hiatal hernia    History of syphilis    Obesity    Sleep apnea    Syphilis   ' Current Outpatient Medications on File Prior to Visit  Medication Sig Dispense Refill   acetaminophen (TYLENOL) 500 MG tablet Take 500 mg by mouth every 6 (six) hours as needed for mild pain.     amitriptyline (ELAVIL) 100 MG tablet Take 100 mg by mouth at bedtime.     calcium carbonate (TUMS - DOSED IN MG  ELEMENTAL CALCIUM) 500 MG chewable tablet Chew 1 tablet by mouth daily.     etonogestrel-ethinyl estradiol (NUVARING) 0.12-0.015 MG/24HR vaginal ring Insert vaginally and leave in place for 3 consecutive weeks, then remove for 1 week. (Patient taking differently: Place 1 each vaginally See admin instructions. Insert vaginally and leave in place for 3 consecutive weeks, then remove for 1 week.) 1 each 11   loratadine (CLARITIN) 10 MG tablet Take 10 mg by mouth 2 (two) times a week.     Multiple Vitamins-Minerals (EMERGEN-C IMMUNE PO) Take 3 capsules by mouth at bedtime. With vitamin D     Multiple Vitamins-Minerals (HAIR SKIN NAILS PO) Take 2 capsules by mouth daily with lunch. Gummie     Pediatric Multiple Vitamins (FLINTSTONES MULTIVITAMIN PO) Take 2 tablets by mouth daily. Gummies     valACYclovir (VALTREX) 500 MG tablet Take 500 mg by mouth daily as needed.     VALERIAN PO Take by mouth.     phentermine (ADIPEX-P) 37.5 MG tablet Take 1 tablet (37.5 mg total) by mouth daily before breakfast. (Patient not taking: Reported on 09/04/2021) 30 tablet 0   No current facility-administered medications on file prior to visit.      Observations/Objective: Home sleep study done on August 27, 2021 showed moderate obstructive sleep apnea with a AHI at 25.7/hour and SPO2 low at 76%  Assessment and Plan: Moderate obstructive sleep apnea.  Patient  education on sleep apnea and treatment options - discussed how weight can impact sleep and risk for sleep disordered breathing - discussed options to assist with weight loss: combination of diet modification, cardiovascular and strength training exercises   - had an extensive discussion regarding the adverse health consequences related to untreated sleep disordered breathing - specifically discussed the risks for hypertension, coronary artery disease, cardiac dysrhythmias, cerebrovascular disease, and diabetes - lifestyle modification discussed   - discussed  how sleep disruption can increase risk of accidents, particularly when driving - safe driving practices were discussed    Begin CPAP AutoSet 5 to 15 cm H2O.  With mask of choice.  Plan  Patient Instructions  Begin CPAP at bedtime. Goal is to wear your CPAP all night long.  Need to wear at least for 4-6 or more hours each night Work on healthy weight loss Do not drive if sleepy Follow-up in 3 months with Dr. Mortimer Fries or Kevyn Boquet NP and As needed       Follow Up Instructions:    I discussed the assessment and treatment plan with the patient. The patient was provided an opportunity to ask questions and all were answered. The patient agreed with the plan and demonstrated an understanding of the instructions.   The patient was advised to call back or seek an in-person evaluation if the symptoms worsen or if the condition fails to improve as anticipated.  I provided 20 minutes of non-face-to-face time during this encounter.   Rexene Edison, NP

## 2021-09-04 NOTE — Patient Instructions (Signed)
Begin CPAP at bedtime. Goal is to wear your CPAP all night long.  Need to wear at least for 4-6 or more hours each night Work on healthy weight loss Do not drive if sleepy Follow-up in 3 months with Dr. Mortimer Fries or Laysa Kimmey NP and As needed

## 2021-09-07 ENCOUNTER — Ambulatory Visit (INDEPENDENT_AMBULATORY_CARE_PROVIDER_SITE_OTHER): Payer: BC Managed Care – PPO | Admitting: Psychology

## 2021-09-07 NOTE — Progress Notes (Signed)
Bariatric Evaluation    CONFIDENTIAL    Client Name: Nancy Owen                                                                      MRN:  [id number] Date of Birth: Aug 24, 1985                                                  Date of Evaluation: 06/12/21 Total Assessment Time: 52 Minutes                                                Date of Report: 06/12/21 Evaluator: Buena Irish, MSW, LCSW                                      Date of Follow-up:  09/07/2021 Referring Physician: Dr. Hassell Done, [facility]    Sources of Information Background Interview Bariatric Questionnaire [place] Interview for Gastric Bypass Beck Anxiety Inventory (BAI) Beck Depression Inventory, 2nd Edition (BDI-II) Eating Disorder Diagnostic Scale (EDDS) Mental Status Examination Mood Disorder Questionnaire (MDQ) Review of Medical Record (provided by [facility])  Patient Identification and Chief Complaint: Nancy Owen was born and raised in [place]. Nancy Owen currently lives with her husband and daughter. Doaa's family is aware and generally supportive of Orit's pursuit of surgery. Nancy Owen denied any family history of mental health or substance use concerns. There is a family history of diabetes (maternal grandfather) and obesity (brother, mother, and both maternal and paternal grandmothers)  Sadye works for [employer] in [employer] for dual eligibility plan and works from home.   Sharayah noted a history of chest pain, sleep apnea, lymphedema, diarrhea (lactose), cholecystectomy, and dysmenorrhea.   Nancy Owen noted a significant concern with GERD, nausea, diarrhea, and severe sleep apnea.   Nancy Owen noted sporadic alcohol use. She noted juice consumption and is working on reducing consumption. Denied caffeine or tobacco use.   Nancy Owen has a history of numerous surgical procedures including left tibia surgery (rog and pins [2014]), gastric sleeve (2017), Bilateral Breast Reduction (2021),  and cholecystectomy (2022).  Patient's Understanding of the Procedure: Trianna noted her interest in the gastric bypass. She noted her previous Gastric Sleeve surgery and noted significant GERD, nausea, diarrhea, and worsening sleep post-surgery. She described the gastric bypass procedure in reasonable detail including the use of laparoscopy and rerouting the intestine. She noted that no portion of her stomach will be removed. She displayed awareness of many of the risks of surgery and anesthesia. She would benefit from a quick review of the less common side-effects. She discussed her motivation for treatment including improving her overall health and fitness and addressing the side-effects of her gastric sleeve.   History of Weight Gains and Losses: Current weight, height, BMI, desired weight: ~245lbs, 5'1, ~160-180lbs.  Rate current activity level from 1 (sedentary) to 10 (very active): 4 Methods of exercise:  1x per week for 15-20 minutes. Limited by time-management.   Eating Disorder History (binging, purging, laxatives): Denied any concerns.  History of dieting and results:  September 2022 - Present: Phentermine, lost 7#, maintained.  November, 2021 - February, 2022, Phentermine, lost 11#, maintained for ~3 months.  June, 2022: Phentermine, lost 4lbs.  July 2019: Female [place], maintained [employer]. 2017: Gastric Sleeve, lost 88#, ~1.5 years.  Feb, 2016, Female [place] maintained [employer]. January, 2016, Female [place], lost 2lbs. November, 2015: Female [place], maintained [employer].  October 2015:  Female [place], maintained [employer].  June, 2015, Female [place] gained 5lbs.  Diet and Plans for the Future: Current diet/typical meals:   Breakfast yogurt with granola with OJ, or cereal a cup, or 2 boiled egg, 1 [place] bacon with OJ, or just some Tea-- ginger or mint and nothing else until lunch  [employer]- can range from  leftover Shrimp or fish or baked/ air fryer chicken from the previous night with either mixed vegetables (carrots, peas, corn, broccoli), or cabbage, or roasted potatoes / mashed potatoes. OR If I don't have time my on the go to is either a [employer] Cobb salad or [employer] Cobb Zalad grilled whichever is closer to me when it is lunch time. Or no lunch.    Dinner Shrimp & mixed vegetables or roasted potatoes or Fish & cabbage Or baked or air fried chicken with mash potatoes I switch the sides occasionally but I stick to Shrimp, Fish, and baked/ air fried Chicken.  Snacks Typically no snacking.    Plans for exercise post-surgery: Joining [employer]. Walking on track & swimming/water aerobics.  How pt copes with stress: Sleep, video games, watching TV,  Other planned changes for the future: Improve overall health and fitness.   Mental Status Examination: Nancy Owen was early to the session and completed all the necessary paperwork. She was dressed casually and her hygiene was observed to be good. She was oriented to person, place, and time. Her attitude was cooperative and cheerful. There were no unusual psychomotor movements or changes. Speech patterns were normal in rate, tone, volume, and without pressure. Affect was reactive and mood congruent. Thought processes were goal-directed and logical. Memory and concentration for both long and short-term were intact. Insight and judgement were good overall.   Intellectual and Psychological Functioning Nancy Owen very warm and friendly. She presented with no significant formal thought disorder. Prior to the session, Nancy Owen completed the Mood Disorder Questionnaire (MDQ) - with no affirmatives for symptoms of Bipolar. Her depression screening, Beck's Depression Index (BDI) netted a score of      14/63, which is indicative of mild mood disturbance (depression symptomatology), Beck Anxiety Inventory (BAI) netted a score   7/63 which  is indicative of minimal anxiety symptomatology, and Eating Disorder Diagnostic Scale - of which Nancy Owen does not meet criteria for. Additionally, Nancy Owen completed The [place] Interview for Gastric Bypass during the session which did not identify any symptoms or behaviors of possible disordered eating.    Conclusions & Recommendations Nancy Owen is a 36 year-old [place] American female who was referred to the [place] division by Dr. Hassell Done at [facility] for a psychological evaluation to determine her suitability for bariatric surgery.   With regard to bariatric surgery, Nancy Owen appears to be highly motivated and has a solid understanding of the bariatric surgery, as well  as risks and lifestyle changes needed to promote post-surgical weight loss success. Results of this evaluation yielded a past history of no reported mental health concerns. At this time, Nancy Owen appears to be able to make an informed decision about the surgery she is contemplating. The patient appears to be motivated and expressed understanding of the post-surgical requirements. If her surgery is scheduled more than 3 months from today's date (09/07/2021) she is required to schedule a follow-up visit in order for this examiner to briefly re-evaluate her psychological status at that time. This follow-up visit should occur within three months of her scheduled surgery date.  The following recommendations are offered in order to promote Nancy Owen's health and well-being:  It is recommended that the patient participate in educational sessions regarding a healthy diet and post-operative meal planning with a dietician or other health care providers.   Re-evaluation. If Nancy Owen Esco's surgery is scheduled more than three months from her date of approval, she is required to contact our office [phone]) for a brief check-in  appointment about a month prior to her surgery date.   Nutritional Counseling. Ms. Emaly Boschert is strongly encouraged to continue attending nutritional counseling appointments in order to plan a healthy diet and post-operative meals. It should be noted that Ms. Jayleena Machi's daily diet includes foods including fats, carbs, and protein. she is encouraged to make recommended changes to her diet prior to surgery in order to increase the chances of continuing a healthy diet after surgery.   Exercise. Ms. Ashle Stief is encouraged to participate in educational sessions on exercise that will be appropriate for her medical conditions and support her weight loss plans in a safe and healthy manner. Specifically, she is encouraged to consider participating in the Bariatric Exercise and Lifestyle Transformation (BELT) program, a partnership between [place]. There, you'll join fellow [facility] bariatric patients three times a week for personalized aerobic and strength training instruction, as well as educational sessions on diet, exercise and behavior modification strategies. BELT meets at [place] at [employer].   Support groups. The patient is encouraged to join a support group to give him/her encouragement as s/he faces the psychological adjustments of bariatric surgery and the need to significantly adjust one's meals and food choices. A list of support groups offered through [facility] can be found through the bariatrics department website: MetroMeds.nl  Self-help resources.  To develop strategies for managing emotional difficulties encountered before and after weight loss surgery, the patient is encouraged to read The Emotional First + Aid Kit: A Practical Guide to Life After Bariatric Surgery, Second Edition by Caren Griffins L. Sheppard Coil, PsyD. Examples of strategies  discussed in this book include relieving stress without using food, developing and maintaining an exercise program, preventing relapse, etc. Ms. Anyjah Roundtree is strongly encouraged to practice mindful eating, the goal of which is to pay close attention to the smell, sight, taste, temperature, texture, etc. of food. Eating mindfully helps to eat slower while enjoying food more fully. Useful books on mindful eating include Savor: Mindful Eating, Mindful Life and How to Eat, both by mindfulness expert Thich Nhat Hanh.  Mental health treatment. For additional support either prior to or after the surgery, Ms. Juliette Standre may consider seeking the care of a therapist (counselor, psychologist) in order to develop skills for coping with the adjustment to a new lifestyle. Available therapists include this examiner, as well as other clinicians within  the [place] department [phone]). she may also seek other in-network providers in the community by searching online at DustingSprays.pl.   General recommendations for bariatric surgery: Replace the habit of late night snacking with something else (e.g., chewing gum, drinking water, or a relaxing activity like reading or crosswords that occupies your hands) and consider going to bed earlier.  Practice eating 4-6 meals per day. Each meal should last about 20 minutes. Practice drinking liquids 30 minutes before or after meals. Keep a food diary for 1 week. Record all foods eaten during the day, including snacks and drinks. Be very specific and very honest.  Get into the habit of reading food labels to evaluate content of protein, sugars, carbohydrates, sodium, etc.  Continue to eat lots of vegetables.  Prepare meals at home, rather than take-out or fast food.  Take multivitamins including zinc and iron.  Develop exercise plans, including a low-impact and safe exercise plan to start 4-5 weeks into recovery, and a more intensive  exercise plan for later.  Determine who will take care of any major responsibilities (particularly those involving physical activity, such as childcare) in the early stages of your recovery.  Educate family and friends who will be involved in your recovery about the extent and importance of your new lifestyle changes. The more they know, the better they can support you and help you stay on track!                                                                                                                               06/12/2021 __________________________________    ______________________ Buena Irish, MSW, LCSW      Date Licensed Clinical Social Worker [facility] [phone]

## 2021-09-07 NOTE — Progress Notes (Addendum)
Time Spent: 1:02  pm - 1:42 pm : 40 Minutes  Lashika Konieczny participated in the session via  video from home and consented to treatment. We met online due to COVID-19 pandemic. Identify was confirmed via three factors. Therapist participated from home office. We completed the interactive feedback session which includes reviewing the following measures: Beck Anxiety Inventory (BAI), Beck's Depression Index (BDI), Eating Disorder Diagnostic Scale (EDDS), Mental Status Examination (MSE), & Mood Disorder Questionnaire (MDQ). During this interactive feedback session we discussed the results of the aforementioned measures, treatment recommendations, and the final determination regarding the psychological approval for bariatric surgery. Shaylee is approved for surgery.   Code [phone] was billed to account for written report which includes integration of patient data, interpretation of standardized test results, interpretation of clinical data, & clinical decision making (60 minutes in total) completed on 06/12/21.  The interactive feedback session was completed today and a total of 40 minutes was spent on feedback alone. Code [phone] was billed for feedback session.   Diagnosis code: e66.01

## 2021-10-01 ENCOUNTER — Encounter: Payer: BC Managed Care – PPO | Attending: Surgery | Admitting: Skilled Nursing Facility1

## 2021-10-01 ENCOUNTER — Other Ambulatory Visit: Payer: Self-pay

## 2021-10-01 DIAGNOSIS — E669 Obesity, unspecified: Secondary | ICD-10-CM | POA: Insufficient documentation

## 2021-10-01 NOTE — Progress Notes (Signed)
Pre-Operative Nutrition Class:   ? ?Patient was seen on 10/01/2021 for Pre-Operative Bariatric Surgery Education at the [facility].   ? ?Surgery date: 10/30/2021 ?Surgery type: RYGB ?Start weight at [facility]: 239.2 ?Weight today: 240.8 ? ?Samples given per MNT protocol. Patient educated on appropriate usage: ?Bariatric Advantage Multivitamin ?Lot # [id number] ?Exp:10/23 ?  ?Ensure max  Shake ?Lot # [id number] ?Exp: 10/13/2021 ?  ? ?The following the learning objectives were met by the patient during this course: ?Identify Pre-Op Dietary Goals and will begin 2 weeks pre-operatively ?Identify appropriate sources of fluids and proteins  ?State protein recommendations and appropriate sources pre and post-operatively ?Identify Post-Operative Dietary Goals and will follow for 2 weeks post-operatively ?Identify appropriate multivitamin and calcium sources ?Describe the need for physical activity post-operatively and will follow MD recommendations ?State when to call healthcare provider regarding medication questions or post-operative complications ?When having a diagnosis of diabetes understanding hypoglycemia symptoms and the inclusion of 1 complex carbohydrate per meal ? ?Handouts given during class include: ?Pre-Op Bariatric Surgery Diet Handout ?Protein Shake Handout ?Post-Op Bariatric Surgery Nutrition Handout ?BELT Program Information Flyer ?Support Group Information Flyer ?[facility] Bariatric Supplements Price List ? ?Follow-Up Plan: ?Patient will follow-up at [facility] 2 weeks post operatively for diet advancement per MD.  ?

## 2021-10-03 ENCOUNTER — Other Ambulatory Visit (HOSPITAL_COMMUNITY): Payer: Self-pay

## 2021-10-03 NOTE — Progress Notes (Signed)
DUE TO COVID-19 ONLY ONE VISITOR IS ALLOWED TO COME WITH YOU AND STAY IN THE WAITING ROOM ONLY DURING PRE OP AND PROCEDURE DAY OF SURGERY.  2 VISITOR  MAY VISIT WITH YOU AFTER SURGERY IN YOUR PRIVATE ROOM DURING VISITING HOURS ONLY! ?YOU MAY HAVE ONE PERSON SPEND THE NITE WITH YOU IN YOUR ROOM AFTER SURGERY.   ? ? Your procedure is scheduled on:  ?   10/30/2021  ? Report to [facility] Main  Entrance ? ? Report to admitting at  0515           AM ?DO NOT [place], PICTURE ID OR WALLET DAY OF SURGERY.  ?  ? ? Call this number if you have problems the morning of surgery [phone]  ? ? REMEMBER: NO  SOLID FOODS , CANDY, GUM OR MINTS AFTER MIDNITE THE NITE BEFORE SURGERY .       Marland Kitchen CLEAR LIQUIDS UNTIL       0430am         DAY OF SURGERY.      PLEASE FINISH ENSURE DRINK PER SURGEON ORDER  WHICH NEEDS TO BE COMPLETED AT         MORNING OF SURGERY.   ?0430am  ? ? ? ?CLEAR LIQUID DIET ? ? ?Foods Allowed      ?WATER ?BLACK COFFEE ( SUGAR OK, NO MILK, CREAM OR CREAMER) REGULAR AND DECAF  ?TEA ( SUGAR OK NO MILK, CREAM, OR CREAMER) REGULAR AND DECAF  ?PLAIN JELLO ( NO RED)  ?FRUIT ICES ( NO RED, NO FRUIT PULP)  ?POPSICLES ( NO RED)  ?JUICE- APPLE, WHITE GRAPE AND WHITE CRANBERRY  ?SPORT DRINK LIKE GATORADE ( NO RED)  ?CLEAR BROTH ( VEGETABLE , CHICKEN OR BEEF)                                                               ? ?    ? ?BRUSH YOUR TEETH MORNING OF SURGERY AND RINSE YOUR MOUTH OUT, NO CHEWING GUM CANDY OR MINTS. ?  ? ? Take these medicines the morning of surgery with A SIP OF WATER:  none  ? ? ?DO NOT TAKE ANY DIABETIC MEDICATIONS DAY OF YOUR SURGERY ?                  ?            You may not have any metal on your body including hair pins and  ?            piercings  Do not wear jewelry, make-up, lotions, powders or perfumes, deodorant ?            Do not wear nail polish on your fingernails.   ?           IF YOU ARE A FEMALE AND WANT TO SHAVE UNDER ARMS OR LEGS PRIOR TO SURGERY YOU MUST DO SO  AT LEAST 48 HOURS PRIOR TO SURGERY.  ?            Men may shave face and neck. ? ? Do not bring valuables to the hospital. [facility] NOT ?            RESPONSIBLE   FOR VALUABLES. ? Contacts, dentures or bridgework  may not be worn into surgery. ? Leave suitcase in the car. After surgery it may be brought to your room. ? ?  ? Patients discharged the day of surgery will not be allowed to drive home. IF YOU ARE HAVING SURGERY AND GOING HOME THE SAME DAY, YOU MUST HAVE AN ADULT TO DRIVE YOU HOME AND BE WITH YOU FOR 24 HOURS. YOU MAY GO HOME BY TAXI OR UBER OR ORTHERWISE, BUT AN ADULT MUST ACCOMPANY YOU HOME AND STAY WITH YOU FOR 24 HOURS. ?  ? ?            Please read over the following fact sheets you were given: ?_____________________________________________________________________ ? ?[place] - Preparing for Surgery ?Before surgery, you can play an important role.  Because skin is not sterile, your skin needs to be as free of germs as possible.  You can reduce the number of germs on your skin by washing with CHG (chlorahexidine gluconate) soap before surgery.  CHG is an antiseptic cleaner which kills germs and bonds with the skin to continue killing germs even after washing. ?Please DO NOT use if you have an allergy to CHG or antibacterial soaps.  If your skin becomes reddened/irritated stop using the CHG and inform your nurse when you arrive at Short Stay. ?Do not shave (including legs and underarms) for at least 48 hours prior to the first CHG shower.  You may shave your face/neck. ?Please follow these instructions carefully: ? 1.  Shower with CHG Soap the night before surgery and the  morning of Surgery. ? 2.  If you choose to wash your hair, wash your hair first as usual with your  normal  shampoo. ? 3.  After you shampoo, rinse your hair and body thoroughly to remove the  shampoo.                           4.  Use CHG as you would any other liquid soap.  You can apply chg directly  to the skin and wash  ?                      Gently with a scrungie or clean washcloth. ? 5.  Apply the CHG Soap to your body ONLY FROM THE NECK DOWN.   Do not use on face/ open      ?                     Wound or open sores. Avoid contact with eyes, ears mouth and genitals (private parts).  ?                     Production manager,  Genitals (private parts) with your normal soap. ?            6.  Wash thoroughly, paying special attention to the area where your surgery  will be performed. ? 7.  Thoroughly rinse your body with warm water from the neck down. ? 8.  DO NOT shower/wash with your normal soap after using and rinsing off  the CHG Soap. ?               9.  Pat yourself dry with a clean towel. ?           10.  Wear clean pajamas. ?           11.  Place clean sheets on  your bed the night of your first shower and do not  sleep with pets. ?Day of Surgery : ?Do not apply any lotions/deodorants the morning of surgery.  Please wear clean clothes to the hospital/surgery center. ? ?FAILURE TO FOLLOW THESE INSTRUCTIONS MAY RESULT IN THE CANCELLATION OF YOUR SURGERY ?PATIENT SIGNATURE_________________________________ ? ?NURSE SIGNATURE__________________________________ ? ?________________________________________________________________________  ? ? ?           ?

## 2021-10-08 ENCOUNTER — Other Ambulatory Visit: Payer: Self-pay

## 2021-10-08 ENCOUNTER — Encounter (HOSPITAL_COMMUNITY): Payer: Self-pay

## 2021-10-08 ENCOUNTER — Encounter (HOSPITAL_COMMUNITY)
Admission: RE | Admit: 2021-10-08 | Discharge: 2021-10-08 | Disposition: A | Discharge status: Home / Self Care | Payer: BC Managed Care – PPO | Source: Ambulatory Visit | Attending: Surgery | Admitting: Surgery

## 2021-10-08 DIAGNOSIS — Z01812 Encounter for preprocedural laboratory examination: Secondary | ICD-10-CM | POA: Insufficient documentation

## 2021-10-08 DIAGNOSIS — Z9884 Bariatric surgery status: Secondary | ICD-10-CM | POA: Insufficient documentation

## 2021-10-08 HISTORY — DX: Headache, unspecified: R51.9

## 2021-10-08 LAB — COMPREHENSIVE METABOLIC PANEL
ALT: 18 U/L (ref 0–44)
AST: 12 U/L — ABNORMAL LOW (ref 15–41)
Albumin: 3.5 g/dL (ref 3.5–5.0)
Alkaline Phosphatase: 88 U/L (ref 38–126)
Anion gap: 7 (ref 5–15)
BUN: 9 mg/dL (ref 6–20)
CO2: 22 mmol/L (ref 22–32)
Calcium: 8.5 mg/dL — ABNORMAL LOW (ref 8.9–10.3)
Chloride: 110 mmol/L (ref 98–111)
Creatinine, Ser: 0.62 mg/dL (ref 0.44–1.00)
GFR, Estimated: >60 mL/min (ref >60)
Glucose, Bld: 83 mg/dL (ref 70–99)
Potassium: 3.4 mmol/L — ABNORMAL LOW (ref 3.5–5.1)
Sodium: 139 mmol/L (ref 135–145)
Total Bilirubin: 0.2 mg/dL — ABNORMAL LOW (ref 0.3–1.2)
Total Protein: 7.8 g/dL (ref 6.5–8.1)

## 2021-10-08 LAB — CBC WITH DIFFERENTIAL/PLATELET
Abs Immature Granulocytes: 0.06 10*3/uL (ref 0.00–0.07)
Basophils Absolute: 0.1 10*3/uL (ref 0.0–0.1)
Basophils Relative: 0 %
Eosinophils Absolute: 0.3 10*3/uL (ref 0.0–0.5)
Eosinophils Relative: 2 %
HCT: 29.8 % — ABNORMAL LOW (ref 36.0–46.0)
Hemoglobin: 8.3 g/dL — ABNORMAL LOW (ref 12.0–15.0)
Immature Granulocytes: 1 %
Lymphocytes Relative: 21 %
Lymphs Abs: 2.7 10*3/uL (ref 0.7–4.0)
MCH: 19.8 pg — ABNORMAL LOW (ref 26.0–34.0)
MCHC: 27.9 g/dL — ABNORMAL LOW (ref 30.0–36.0)
MCV: 71 fL — ABNORMAL LOW (ref 80.0–100.0)
Monocytes Absolute: 0.9 10*3/uL (ref 0.1–1.0)
Monocytes Relative: 7 %
Neutro Abs: 9 10*3/uL — ABNORMAL HIGH (ref 1.7–7.7)
Neutrophils Relative %: 69 %
Platelets: 428 10*3/uL — ABNORMAL HIGH (ref 150–400)
RBC: 4.2 MIL/uL (ref 3.87–5.11)
RDW: 18.2 % — ABNORMAL HIGH (ref 11.5–15.5)
WBC: 12.9 10*3/uL — ABNORMAL HIGH (ref 4.0–10.5)
nRBC: 0 % (ref 0.0–0.2)

## 2021-10-08 NOTE — Progress Notes (Addendum)
Anesthesia Review: ? ?PCP: DR Waunita Schooner in [place]  ?Cardiologist : none  ?Chest x-ray : 10/19/20  ?Ct angio chest- 10/2020  ?EKG :10/20/20  ?Echo : ?Stress test: ?Cardiac Cath :  ?Activity level: can do a flight of stairs without difficulty  ?Sleep Study/ CPAP : awating new machine per pt  ?Fasting Blood Sugar :      / Checks Blood Sugar -- times a day:   ?Blood Thinner/ Instructions /Last Dose: ?ASA / Instructions/ Last Dose :   ?Phentermine- pt states last dose was 07/2021. ?PT does not like grape flavored products.  PT was given G2 lower sugar grape flavor.  PT states she is not allergic to grape jus tdoes not like flavor.  PT was instructed she may go to grocery store and buy g2 lower sugar in another flavor for preop as long as not red in color.  PT voiced understanding.    ?CBC done 10/08/21 routed to DR [facility].Marland Kitchen   ?White count- 12.9 hgb- 8.3. Armando Reichert made aware on 10/08/21.  NO new orders given.  ?

## 2021-10-29 NOTE — H&P (Signed)
PROVIDER: Joya San, MD ? ?MRN: [id number] ?DOB: 22-Sep-1985 ? ? ?Subjective  ? ?Chief Complaint: Bariatric pre-op (Sleeve to RNY) ? ? ?History of Present Illness: ?Nancy Owen is a 36 y.o. female who is seen today as an office consultation at the request of Dr. Pasty Arch for evaluation of Bariatric pre-op (Sleeve to RNY) ?.  ? ?She had a sleeve gastrectomy by Dr. Excell Seltzer. I reviewed her upper GI and she does have a little outpouching in her cardia whether that may be posterior or not. ?This was done in 2017 with [place] assisting him. He did do a test for hiatal hernia with the balloon. Despite that she had weight regain and also GERD. The GERD got rather severe so she came back for revision from sleeve gastrectomy to Roux-en-Y gastric bypass. This has been explained to her in some detail. Today I saw her in the office and answered her questions. She is getting a new CPAP machine. She also wears lymphedema hose which she wears in the morning and in the evening. Her questions were all answered and she is ready to go on April 18. Orders were placed in epic. ? ?Review of Systems: ?See HPI as well for other ROS. ? ?ROS  ? ?Medical History: ?Past Medical History:  ?Diagnosis Date  ? Anemia  ? GERD (gastroesophageal reflux disease)  ? History of chlamydia  ? Sleep apnea  ? Syphilis  ? ?Patient Active Problem List  ?Diagnosis  ? Syphilis  ? History of syphilis  ? Herpes genitalia  ? History of bariatric surgery  ? Hx of fracture of tibia  ? Insomnia  ? Lymphedema  ? Macromastia  ? Obesity  ? OSA (obstructive sleep apnea)  ? Swelling of limb  ? S/P bilateral breast reduction  ? Symptomatic cholelithiasis  ? ?Past Surgical History:  ?Procedure Laterality Date  ? CHOLECYSTECTOMY  ? FRACTURE SURGERY  ? gastric sleeve  ? REDUCTION MAMMAPLASTY Bilateral  ? ? ?No Known Allergies ? ?Current Outpatient Medications on File Prior to Visit  ?Medication Sig Dispense Refill  ? acetaminophen (TYLENOL) 500 MG tablet Take by  mouth  ? amitriptyline (ELAVIL) 100 MG tablet Take by mouth  ? levonorgestrel (MIRENA) 20 mcg/24 hr (5 years) IUD Insert into the uterus.  ? oxyCODONE (ROXICODONE) 5 MG immediate release tablet Take by mouth  ? phentermine (ADIPEX-P) 37.5 mg tablet Take by mouth  ? ?No current facility-administered medications on file prior to visit.  ? ?Family History  ?Problem Relation Age of Onset  ? Diabetes Mother  ? Obesity Brother  ? High blood pressure (Hypertension) Other  ? Hyperlipidemia (Elevated cholesterol) Other  ? Diabetes Other  ? Breast cancer Other  ? ? ?Social History  ? ?Tobacco Use  ?Smoking Status Never  ?Smokeless Tobacco Never  ? ? ?Social History  ? ?Socioeconomic History  ? Marital status: Married  ?Tobacco Use  ? Smoking status: Never  ? Smokeless tobacco: Never  ?Vaping Use  ? Vaping Use: Never used  ?Substance and Sexual Activity  ? Alcohol use: Yes  ? Drug use: No  ? ?Objective:  ? ?Vitals:  ?Weight: (!) 107.7 kg (237 lb 6.4 oz)  ?Height: 154.9 cm ('5\' 1"'$ )  ? ?Body mass index is 44.86 kg/m?. ? ?Physical Exam ?General: Obese African-American female BMI 44.8 today ?HEENT : Glasses ?Chest: Clear ?Heart: Sinus rhythm without murmurs ?Breast: Not examined but history of reduction ?Abdomen: Nontender. Lap chole last year ?GU not examined ?Rectal not performed ?Extremities lymphedema  and lower extremity wearing hose twice a day that inflate ?Neuro alert and oriented x3. Motor and sensory function are grossly intact ? ?Labs, Imaging and Diagnostic Testing: ?Upper GI series was evaluated ? ?Assessment and Plan:  ?Diagnoses and all orders for this visit: ? ?GERD without esophagitis ? ?S/P laparoscopic sleeve gastrectomy ? ?To OR on April 18 for laparoscopic revision conversion of sleeve gastrectomy to Roux-en-Y gastric bypass. ? ?Wiley Flicker Donia Pounds, MD  ? ?

## 2021-10-30 ENCOUNTER — Inpatient Hospital Stay (HOSPITAL_COMMUNITY)
Admission: RE | Admit: 2021-10-30 | Discharge: 2021-11-01 | DRG: 327 | Disposition: A | Discharge status: Home / Self Care | Payer: BC Managed Care – PPO | Attending: Surgery | Admitting: Surgery

## 2021-10-30 ENCOUNTER — Other Ambulatory Visit: Payer: Self-pay

## 2021-10-30 ENCOUNTER — Inpatient Hospital Stay (HOSPITAL_COMMUNITY): Payer: BC Managed Care – PPO | Admitting: Physician Assistant

## 2021-10-30 ENCOUNTER — Encounter (HOSPITAL_COMMUNITY)
Admission: RE | Disposition: A | Discharge status: Home / Self Care | Payer: Self-pay | Source: Home / Self Care | Attending: Surgery

## 2021-10-30 ENCOUNTER — Encounter (HOSPITAL_COMMUNITY): Payer: Self-pay | Admitting: Surgery

## 2021-10-30 ENCOUNTER — Inpatient Hospital Stay (HOSPITAL_COMMUNITY): Payer: BC Managed Care – PPO | Admitting: Anesthesiology

## 2021-10-30 DIAGNOSIS — Z79899 Other long term (current) drug therapy: Secondary | ICD-10-CM | POA: Diagnosis not present

## 2021-10-30 DIAGNOSIS — K219 Gastro-esophageal reflux disease without esophagitis: Secondary | ICD-10-CM | POA: Diagnosis present

## 2021-10-30 DIAGNOSIS — Z6841 Body Mass Index (BMI) 40.0 and over, adult: Secondary | ICD-10-CM | POA: Diagnosis not present

## 2021-10-30 DIAGNOSIS — Z833 Family history of diabetes mellitus: Secondary | ICD-10-CM | POA: Diagnosis not present

## 2021-10-30 DIAGNOSIS — K449 Diaphragmatic hernia without obstruction or gangrene: Secondary | ICD-10-CM | POA: Diagnosis present

## 2021-10-30 DIAGNOSIS — Z9884 Bariatric surgery status: Secondary | ICD-10-CM

## 2021-10-30 DIAGNOSIS — Z803 Family history of malignant neoplasm of breast: Secondary | ICD-10-CM | POA: Diagnosis not present

## 2021-10-30 DIAGNOSIS — I890 Lymphedema, not elsewhere classified: Secondary | ICD-10-CM | POA: Diagnosis present

## 2021-10-30 DIAGNOSIS — Z8249 Family history of ischemic heart disease and other diseases of the circulatory system: Secondary | ICD-10-CM | POA: Diagnosis not present

## 2021-10-30 DIAGNOSIS — G4733 Obstructive sleep apnea (adult) (pediatric): Secondary | ICD-10-CM | POA: Diagnosis present

## 2021-10-30 HISTORY — PX: GASTRIC ROUX-EN-Y: SHX5262

## 2021-10-30 LAB — CBC
HCT: 27.2 % — ABNORMAL LOW (ref 36.0–46.0)
Hemoglobin: 7.7 g/dL — ABNORMAL LOW (ref 12.0–15.0)
MCH: 20.1 pg — ABNORMAL LOW (ref 26.0–34.0)
MCHC: 28.3 g/dL — ABNORMAL LOW (ref 30.0–36.0)
MCV: 70.8 fL — ABNORMAL LOW (ref 80.0–100.0)
Platelets: 368 10*3/uL (ref 150–400)
RBC: 3.84 MIL/uL — ABNORMAL LOW (ref 3.87–5.11)
RDW: 18.6 % — ABNORMAL HIGH (ref 11.5–15.5)
WBC: 21.1 10*3/uL — ABNORMAL HIGH (ref 4.0–10.5)
nRBC: 0 % (ref 0.0–0.2)

## 2021-10-30 LAB — SAMPLE TO BLOOD BANK

## 2021-10-30 LAB — PREGNANCY, URINE: Preg Test, Ur: NEGATIVE

## 2021-10-30 LAB — CREATININE, SERUM
Creatinine, Ser: 0.69 mg/dL (ref 0.44–1.00)
GFR, Estimated: >60 mL/min (ref >60)

## 2021-10-30 LAB — TYPE AND SCREEN
ABO/RH(D): O POS
Antibody Screen: NEGATIVE

## 2021-10-30 LAB — ABO/RH: ABO/RH(D): O POS

## 2021-10-30 SURGERY — LAPAROSCOPIC ROUX-EN-Y GASTRIC BYPASS WITH UPPER ENDOSCOPY
Anesthesia: General

## 2021-10-30 MED ORDER — FENTANYL CITRATE PF 50 MCG/ML IJ SOSY
PREFILLED_SYRINGE | INTRAMUSCULAR | Status: AC
  Filled 2021-10-30: qty 2

## 2021-10-30 MED ORDER — ONDANSETRON HCL 4 MG/2ML IJ SOLN: 4.0000 mg | INTRAMUSCULAR | Status: DC | PRN

## 2021-10-30 MED ORDER — MORPHINE SULFATE (PF) 2 MG/ML IV SOLN
1.0000 mg | INTRAVENOUS | Status: DC | PRN
  Administered 2021-10-30: 2 mg via INTRAVENOUS
  Filled 2021-10-30: qty 1

## 2021-10-30 MED ORDER — 0.9 % SODIUM CHLORIDE (POUR BTL) OPTIME
TOPICAL | Status: DC | PRN
  Administered 2021-10-30: 1000 mL

## 2021-10-30 MED ORDER — SUGAMMADEX SODIUM 500 MG/5ML IV SOLN
INTRAVENOUS | Status: AC
  Filled 2021-10-30: qty 5

## 2021-10-30 MED ORDER — AMISULPRIDE (ANTIEMETIC) 5 MG/2ML IV SOLN: 10.0000 mg | Freq: Once | INTRAVENOUS | Status: DC | PRN

## 2021-10-30 MED ORDER — MIDAZOLAM HCL 2 MG/2ML IJ SOLN
INTRAMUSCULAR | Status: AC
  Filled 2021-10-30: qty 2

## 2021-10-30 MED ORDER — ACETAMINOPHEN 500 MG PO TABS
1000.0000 mg | ORAL_TABLET | ORAL | Status: AC
  Administered 2021-10-30: 1000 mg via ORAL
  Filled 2021-10-30: qty 2

## 2021-10-30 MED ORDER — ENSURE MAX PROTEIN PO LIQD
2.0000 [oz_av] | ORAL | Status: DC
  Administered 2021-10-31 – 2021-11-01 (×9): 2 [oz_av] via ORAL

## 2021-10-30 MED ORDER — PROPOFOL 10 MG/ML IV BOLUS
INTRAVENOUS | Status: AC
  Filled 2021-10-30: qty 20

## 2021-10-30 MED ORDER — PANTOPRAZOLE SODIUM 40 MG IV SOLR
40.0000 mg | Freq: Every day | INTRAVENOUS | Status: DC
  Administered 2021-10-30 – 2021-10-31 (×2): 40 mg via INTRAVENOUS
  Filled 2021-10-30 (×2): qty 10

## 2021-10-30 MED ORDER — FENTANYL CITRATE (PF) 250 MCG/5ML IJ SOLN
INTRAMUSCULAR | Status: DC | PRN
  Administered 2021-10-30 (×2): 25 ug via INTRAVENOUS
  Administered 2021-10-30: 150 ug via INTRAVENOUS

## 2021-10-30 MED ORDER — CHLORHEXIDINE GLUCONATE CLOTH 2 % EX PADS: 6.0000 | MEDICATED_PAD | Freq: Once | CUTANEOUS | Status: DC

## 2021-10-30 MED ORDER — KETAMINE HCL 10 MG/ML IJ SOLN
INTRAMUSCULAR | Status: DC | PRN
  Administered 2021-10-30: 10 mg via INTRAVENOUS
  Administered 2021-10-30: 20 mg via INTRAVENOUS

## 2021-10-30 MED ORDER — BUPIVACAINE LIPOSOME 1.3 % IJ SUSP: 20.0000 mL | Freq: Once | INTRAMUSCULAR | Status: DC

## 2021-10-30 MED ORDER — BUPIVACAINE LIPOSOME 1.3 % IJ SUSP
INTRAMUSCULAR | Status: AC
  Filled 2021-10-30: qty 20

## 2021-10-30 MED ORDER — PROPOFOL 10 MG/ML IV BOLUS
INTRAVENOUS | Status: DC | PRN
  Administered 2021-10-30: 200 mg via INTRAVENOUS

## 2021-10-30 MED ORDER — ACETAMINOPHEN 500 MG PO TABS
1000.0000 mg | ORAL_TABLET | Freq: Three times a day (TID) | ORAL | Status: DC
  Administered 2021-10-30 – 2021-10-31 (×4): 1000 mg via ORAL
  Filled 2021-10-30 (×5): qty 2

## 2021-10-30 MED ORDER — OXYCODONE HCL 5 MG PO TABS: 5.0000 mg | ORAL_TABLET | Freq: Once | ORAL | Status: DC | PRN

## 2021-10-30 MED ORDER — PHENYLEPHRINE HCL (PRESSORS) 10 MG/ML IV SOLN
INTRAVENOUS | Status: AC
  Filled 2021-10-30: qty 1

## 2021-10-30 MED ORDER — DEXMEDETOMIDINE (PRECEDEX) IN NS 20 MCG/5ML (4 MCG/ML) IV SYRINGE
PREFILLED_SYRINGE | INTRAVENOUS | Status: DC | PRN
  Administered 2021-10-30: 4 ug via INTRAVENOUS
  Administered 2021-10-30 (×2): 8 ug via INTRAVENOUS

## 2021-10-30 MED ORDER — SODIUM CHLORIDE 0.9 % IV SOLN
2.0000 g | INTRAVENOUS | Status: AC
  Administered 2021-10-30: 2 g via INTRAVENOUS
  Filled 2021-10-30: qty 2

## 2021-10-30 MED ORDER — ROCURONIUM BROMIDE 10 MG/ML (PF) SYRINGE
PREFILLED_SYRINGE | INTRAVENOUS | Status: DC | PRN
  Administered 2021-10-30 (×2): 50 mg via INTRAVENOUS
  Administered 2021-10-30: 30 mg via INTRAVENOUS
  Administered 2021-10-30: 20 mg via INTRAVENOUS

## 2021-10-30 MED ORDER — SODIUM CHLORIDE (PF) 0.9 % IJ SOLN
INTRAMUSCULAR | Status: DC | PRN
  Administered 2021-10-30: 10 mL

## 2021-10-30 MED ORDER — LIDOCAINE 2% (20 MG/ML) 5 ML SYRINGE
INTRAMUSCULAR | Status: DC | PRN
  Administered 2021-10-30: 60 mg via INTRAVENOUS

## 2021-10-30 MED ORDER — FENTANYL CITRATE PF 50 MCG/ML IJ SOSY
25.0000 ug | PREFILLED_SYRINGE | INTRAMUSCULAR | Status: DC | PRN
  Administered 2021-10-30: 50 ug via INTRAVENOUS

## 2021-10-30 MED ORDER — SCOPOLAMINE 1 MG/3DAYS TD PT72
1.0000 | MEDICATED_PATCH | TRANSDERMAL | Status: DC
  Administered 2021-10-30: 1.5 mg via TRANSDERMAL
  Filled 2021-10-30: qty 1

## 2021-10-30 MED ORDER — LACTATED RINGERS IR SOLN
Status: DC | PRN
  Administered 2021-10-30: 1000 mL

## 2021-10-30 MED ORDER — HEPARIN SODIUM (PORCINE) 5000 UNIT/ML IJ SOLN
5000.0000 [IU] | INTRAMUSCULAR | Status: AC
  Administered 2021-10-30: 5000 [IU] via SUBCUTANEOUS
  Filled 2021-10-30: qty 1

## 2021-10-30 MED ORDER — TISSEEL VH 10 ML EX KIT
PACK | CUTANEOUS | Status: AC
  Filled 2021-10-30: qty 1

## 2021-10-30 MED ORDER — APREPITANT 40 MG PO CAPS
40.0000 mg | ORAL_CAPSULE | ORAL | Status: AC
  Administered 2021-10-30: 40 mg via ORAL
  Filled 2021-10-30: qty 1

## 2021-10-30 MED ORDER — SUGAMMADEX SODIUM 500 MG/5ML IV SOLN
INTRAVENOUS | Status: DC | PRN
  Administered 2021-10-30: 424.4 mg via INTRAVENOUS

## 2021-10-30 MED ORDER — FENTANYL CITRATE (PF) 250 MCG/5ML IJ SOLN
INTRAMUSCULAR | Status: AC
  Filled 2021-10-30: qty 5

## 2021-10-30 MED ORDER — EPHEDRINE SULFATE-NACL 50-0.9 MG/10ML-% IV SOSY
PREFILLED_SYRINGE | INTRAVENOUS | Status: DC | PRN
Start: 2021-10-30 — End: 2021-10-30
  Administered 2021-10-30: 5 mg via INTRAVENOUS

## 2021-10-30 MED ORDER — OXYCODONE HCL 5 MG/5ML PO SOLN: 5.0000 mg | Freq: Once | ORAL | Status: DC | PRN

## 2021-10-30 MED ORDER — DEXMEDETOMIDINE (PRECEDEX) IN NS 20 MCG/5ML (4 MCG/ML) IV SYRINGE
PREFILLED_SYRINGE | INTRAVENOUS | Status: AC
  Filled 2021-10-30: qty 10

## 2021-10-30 MED ORDER — KETAMINE HCL 10 MG/ML IJ SOLN
INTRAMUSCULAR | Status: AC
  Filled 2021-10-30: qty 1

## 2021-10-30 MED ORDER — KCL IN DEXTROSE-NACL 20-5-0.45 MEQ/L-%-% IV SOLN
INTRAVENOUS | Status: DC
  Filled 2021-10-30 (×4): qty 1000

## 2021-10-30 MED ORDER — BUPIVACAINE LIPOSOME 1.3 % IJ SUSP
INTRAMUSCULAR | Status: DC | PRN
  Administered 2021-10-30: 20 mL

## 2021-10-30 MED ORDER — ACETAMINOPHEN 160 MG/5ML PO SOLN
1000.0000 mg | Freq: Three times a day (TID) | ORAL | Status: DC
  Filled 2021-10-30: qty 40.6

## 2021-10-30 MED ORDER — ONDANSETRON HCL 4 MG/2ML IJ SOLN
INTRAMUSCULAR | Status: DC | PRN
  Administered 2021-10-30: 4 mg via INTRAVENOUS

## 2021-10-30 MED ORDER — OXYCODONE HCL 5 MG/5ML PO SOLN
5.0000 mg | Freq: Four times a day (QID) | ORAL | Status: DC | PRN
  Administered 2021-10-30: 5 mg via ORAL
  Filled 2021-10-30 (×2): qty 5

## 2021-10-30 MED ORDER — EPHEDRINE 5 MG/ML INJ
INTRAVENOUS | Status: AC
  Filled 2021-10-30: qty 5

## 2021-10-30 MED ORDER — PHENYLEPHRINE HCL-NACL 20-0.9 MG/250ML-% IV SOLN
INTRAVENOUS | Status: DC | PRN
  Administered 2021-10-30: 80 ug/min via INTRAVENOUS

## 2021-10-30 MED ORDER — SODIUM CHLORIDE (PF) 0.9 % IJ SOLN
INTRAMUSCULAR | Status: AC
  Filled 2021-10-30: qty 20

## 2021-10-30 MED ORDER — PHENYLEPHRINE 80 MCG/ML (10ML) SYRINGE FOR IV PUSH (FOR BLOOD PRESSURE SUPPORT)
PREFILLED_SYRINGE | INTRAVENOUS | Status: DC | PRN
  Administered 2021-10-30: 80 ug via INTRAVENOUS
  Administered 2021-10-30: 120 ug via INTRAVENOUS
  Administered 2021-10-30 (×3): 80 ug via INTRAVENOUS

## 2021-10-30 MED ORDER — MIDAZOLAM HCL 5 MG/5ML IJ SOLN
INTRAMUSCULAR | Status: DC | PRN
  Administered 2021-10-30: 2 mg via INTRAVENOUS

## 2021-10-30 MED ORDER — METOPROLOL TARTRATE 5 MG/5ML IV SOLN: 5.0000 mg | Freq: Four times a day (QID) | INTRAVENOUS | Status: DC | PRN

## 2021-10-30 MED ORDER — TISSEEL VH 10 ML EX KIT
PACK | CUTANEOUS | Status: DC | PRN
  Administered 2021-10-30: 10 mL

## 2021-10-30 MED ORDER — DEXAMETHASONE SODIUM PHOSPHATE 10 MG/ML IJ SOLN
INTRAMUSCULAR | Status: DC | PRN
  Administered 2021-10-30: 10 mg via INTRAVENOUS

## 2021-10-30 MED ORDER — LACTATED RINGERS IV SOLN
INTRAVENOUS | Status: DC
Start: 2021-10-30 — End: 2021-10-30

## 2021-10-30 MED ORDER — ROCURONIUM BROMIDE 10 MG/ML (PF) SYRINGE
PREFILLED_SYRINGE | INTRAVENOUS | Status: AC
  Filled 2021-10-30: qty 20

## 2021-10-30 MED ORDER — HEPARIN SODIUM (PORCINE) 5000 UNIT/ML IJ SOLN
5000.0000 [IU] | Freq: Three times a day (TID) | INTRAMUSCULAR | Status: DC
  Administered 2021-10-30 – 2021-10-31 (×5): 5000 [IU] via SUBCUTANEOUS
  Filled 2021-10-30 (×6): qty 1

## 2021-10-30 SURGICAL SUPPLY — 67 items
APPLICATOR COTTON TIP 6 STRL (MISCELLANEOUS) ×2 IMPLANT
APPLICATOR COTTON TIP 6IN STRL (MISCELLANEOUS) ×4
APPLIER CLIP ROT 10 11.4 M/L (STAPLE)
BAG COUNTER SPONGE SURGICOUNT (BAG) IMPLANT
BLADE SURG 15 STRL LF DISP TIS (BLADE) ×1 IMPLANT
BLADE SURG 15 STRL SS (BLADE) ×1
CABLE HIGH FREQUENCY MONO STRZ (ELECTRODE) IMPLANT
CLIP APPLIE ROT 10 11.4 M/L (STAPLE) IMPLANT
CLIP SUT LAPRA TY ABSORB (SUTURE) ×3 IMPLANT
DEVICE SUT QUICK LOAD TK 5 (STAPLE) ×2 IMPLANT
DEVICE SUT TI-KNOT TK 5X26 (MISCELLANEOUS) ×1 IMPLANT
DEVICE SUTURE ENDOST 10MM (ENDOMECHANICALS) ×2 IMPLANT
DISSECTOR BLUNT TIP ENDO 5MM (MISCELLANEOUS) IMPLANT
DRAIN PENROSE 0.25X18 (DRAIN) ×2 IMPLANT
GAUZE 4X4 16PLY ~~LOC~~+RFID DBL (SPONGE) ×2 IMPLANT
GLOVE SURG LX 8.0 MICRO (GLOVE) ×1
GLOVE SURG LX STRL 8.0 MICRO (GLOVE) ×1 IMPLANT
GOWN STRL REUS W/ TWL XL LVL3 (GOWN DISPOSABLE) ×2 IMPLANT
GOWN STRL REUS W/TWL XL LVL3 (GOWN DISPOSABLE) ×2
HANDLE STAPLE EGIA 4 XL (STAPLE) ×2 IMPLANT
IRRIG SUCT STRYKERFLOW 2 WTIP (MISCELLANEOUS) ×2
IRRIGATION SUCT STRKRFLW 2 WTP (MISCELLANEOUS) ×1 IMPLANT
KIT BASIN OR (CUSTOM PROCEDURE TRAY) ×2 IMPLANT
KIT GASTRIC LAVAGE 34FR ADT (SET/KITS/TRAYS/PACK) ×2 IMPLANT
KIT TURNOVER KIT A (KITS) ×1 IMPLANT
MARKER SKIN DUAL TIP RULER LAB (MISCELLANEOUS) ×2 IMPLANT
MAT PREVALON FULL STRYKER (MISCELLANEOUS) ×2 IMPLANT
NDL SPNL 22GX3.5 QUINCKE BK (NEEDLE) ×1 IMPLANT
NEEDLE SPNL 22GX3.5 QUINCKE BK (NEEDLE) ×2 IMPLANT
PACK CARDIOVASCULAR III (CUSTOM PROCEDURE TRAY) ×2 IMPLANT
PENCIL SMOKE EVACUATOR (MISCELLANEOUS) IMPLANT
RELOAD EGIA 45 MED/THCK PURPLE (STAPLE) ×1 IMPLANT
RELOAD EGIA 60 MED/THCK PURPLE (STAPLE) ×4 IMPLANT
RELOAD EGIA 60 TAN VASC (STAPLE) ×2 IMPLANT
RELOAD ENDO STITCH 2.0 (ENDOMECHANICALS) ×9
RELOAD STAPLE 60 MED/THCK ART (STAPLE) IMPLANT
RELOAD SUT SNGL STCH ABSRB 2-0 (ENDOMECHANICALS) ×5 IMPLANT
RELOAD SUT SNGL STCH BLK 2-0 (ENDOMECHANICALS) ×4 IMPLANT
RELOAD TRI 60 ART MED THCK PUR (STAPLE) ×1 IMPLANT
SCISSORS LAP 5X45 EPIX DISP (ENDOMECHANICALS) ×2 IMPLANT
SEALANT SURGICAL APPL DUAL CAN (MISCELLANEOUS) ×3 IMPLANT
SET TUBE SMOKE EVAC HIGH FLOW (TUBING) ×2 IMPLANT
SHEARS HARMONIC ACE PLUS 45CM (MISCELLANEOUS) ×2 IMPLANT
SLEEVE ADV FIXATION 12X100MM (TROCAR) ×4 IMPLANT
SLEEVE ADV FIXATION 5X100MM (TROCAR) IMPLANT
SOL ANTI FOG 6CC (MISCELLANEOUS) ×1 IMPLANT
SOLUTION ANTI FOG 6CC (MISCELLANEOUS) ×1
STAPLER VISISTAT 35W (STAPLE) IMPLANT
SUT MNCRL AB 4-0 PS2 18 (SUTURE) ×6 IMPLANT
SUT RELOAD ENDO STITCH 2 48X1 (ENDOMECHANICALS) ×5
SUT RELOAD ENDO STITCH 2.0 (ENDOMECHANICALS) ×4
SUT SURGIDAC NAB ES-9 0 48 120 (SUTURE) ×2 IMPLANT
SUT VIC AB 2-0 SH 27 (SUTURE) ×1
SUT VIC AB 2-0 SH 27X BRD (SUTURE) ×1 IMPLANT
SUTURE RELOAD END STTCH 2 48X1 (ENDOMECHANICALS) ×5 IMPLANT
SUTURE RELOAD ENDO STITCH 2.0 (ENDOMECHANICALS) ×4 IMPLANT
SYR 10ML ECCENTRIC (SYRINGE) ×2 IMPLANT
SYR 20ML LL LF (SYRINGE) ×4 IMPLANT
SYR 50ML LL SCALE MARK (SYRINGE) ×2 IMPLANT
TOWEL OR 17X26 10 PK STRL BLUE (TOWEL DISPOSABLE) ×2 IMPLANT
TOWEL OR NON WOVEN STRL DISP B (DISPOSABLE) ×2 IMPLANT
TRAY FOLEY MTR SLVR 16FR STAT (SET/KITS/TRAYS/PACK) ×1 IMPLANT
TROCAR ADV FIXATION 12X100MM (TROCAR) ×2 IMPLANT
TROCAR ADV FIXATION 5X100MM (TROCAR) ×2 IMPLANT
TROCAR BLADELESS OPT 5 100 (ENDOMECHANICALS) ×2 IMPLANT
TROCAR XCEL 12X100 BLDLESS (ENDOMECHANICALS) ×2 IMPLANT
TUBING CONNECTING 10 (TUBING) ×4 IMPLANT

## 2021-10-30 NOTE — Progress Notes (Signed)
PHARMACY CONSULT FOR:  Risk Assessment for Post-Discharge VTE Following Bariatric Surgery ? ?Post-Discharge VTE Risk Assessment: ?This patient's probability of 30-day post-discharge VTE is increased due to the factors marked: ? Sleeve gastrectomy  ? Liver disorder (transplant, cirrhosis, or nonalcoholic steatohepatitis)  ? Hx of VTE  ? Hemorrhage requiring transfusion  ? GI perforation, leak, or obstruction  ? ====================================================  ?  Female  ?  Age >/=60 years  ?  BMI >/=50 kg/m2  ?  CHF  ?  Dyspnea at Rest  ?  Paraplegia  ?x  Non-gastric-band surgery  ?  Operation Time >/=3 hr  ?  Return to OR   ?  Length of Stay >/= 3 d  ? Hypercoagulable condition  ? Significant venous stasis  ? ? ? ? ?Predicted probability of 30-day post-discharge VTE: 0.16 % ? ?Other patient-specific factors to consider: ? ? ?Recommendation for Discharge: ?No pharmacologic prophylaxis post-discharge ? ? ? ? ?Nancy Owen is a 36 y.o. female who underwent  laparoscopic revision conversion of sleeve gastrectomy to Roux-en-Y gastric bypass on 10/30/2021. ?  ?Case start: 0805 ?Case end: 1016 ? ? ?No Known Allergies ? ?Patient Measurements: ?Weight: 106.1 kg (234 lb) ?Body mass index is 44.21 kg/m?. ? ?No results for input(s): WBC, HGB, HCT, PLT, APTT, CREATININE, LABCREA, CREATININE, CREAT24HRUR, MG, PHOS, ALBUMIN, PROT, ALBUMIN, AST, ALT, ALKPHOS, BILITOT, BILIDIR, IBILI in the last 72 hours. ?CrCl cannot be calculated (Patient's most recent lab result is older than the maximum 21 days allowed.). ? ? ? ?Past Medical History:  ?Diagnosis Date  ? Allergic rhinitis   ? Encounter for insertion of mirena IUD   ? GERD (gastroesophageal reflux disease)   ? Gonorrhea   ? Headache   ? Herpes genitalia   ? High-risk pregnancy 01/17/2014  ? chorio/mild utering atony  ? History of hiatal hernia   ? History of syphilis   ? Obesity   ? Sleep apnea   ? awaitin new one  ? Syphilis   ? ? ? ?Medications Prior to Admission   ?Medication Sig Dispense Refill Last Dose  ? calcium carbonate (TUMS - DOSED IN MG ELEMENTAL CALCIUM) 500 MG chewable tablet Chew 1 tablet by mouth 2 (two) times daily as needed for indigestion or heartburn.   Past Month  ? etonogestrel-ethinyl estradiol (NUVARING) 0.12-0.015 MG/24HR vaginal ring Insert vaginally and leave in place for 3 consecutive weeks, then remove for 1 week. 1 each 11   ? loratadine (CLARITIN) 10 MG tablet Take 10 mg by mouth daily as needed for allergies.   Past Week  ? Multiple Vitamins-Minerals (HAIR SKIN NAILS PO) Take 2 capsules by mouth daily with lunch. Gummie   Past Month  ? Pediatric Multiple Vitamins (FLINTSTONES MULTIVITAMIN PO) Take 2 tablets by mouth daily. Gummies   Past Month  ? acetaminophen (TYLENOL) 500 MG tablet Take 500 mg by mouth every 6 (six) hours as needed for mild pain.   More than a month  ? phentermine (ADIPEX-P) 37.5 MG tablet Take 1 tablet (37.5 mg total) by mouth daily before breakfast. (Patient not taking: Reported on 09/04/2021) 30 tablet 0   ? valACYclovir (VALTREX) 500 MG tablet Take 500 mg by mouth daily as needed (outbreak).   More than a month  ? ? ? ? ? ?Royetta Asal, PharmD, BCPS ?Clinical Pharmacist ?[place] ?Please utilize Amion for appropriate phone number to reach the unit pharmacist ([place]) ?10/30/2021 1:15 PM ? ? ?

## 2021-10-30 NOTE — Op Note (Signed)
Preoperative diagnosis: Conversion from sleeve gastrectomy to Roux-en-Y gastric bypass ? ?Postoperative diagnosis: Same  ? ?Procedure: Upper endoscopy  ? ?Surgeon: Clovis Riley, M.D. ? ?Anesthesia: Gen.  ? ?Description of procedure: The endoscope was placed in the mouth and oropharynx and under endoscopic vision it was advanced to the esophagogastric junction which was identified at 36cm from the teeth.  The pouch was tensely insufflated while the upper abdomen was flooded with irrigation to perform a leak test, which was negative. No bubbles were seen.  The anastomosis was widely patent and hemostatic.  The pouch is about 4 cm in length along the lesser curve. The lumen was decompressed and the scope was withdrawn without difficulty.   ? ?Clovis Riley, M.D. ?General, Bariatric, & Minimally Invasive Surgery ?[facility], [place] ?  ?

## 2021-10-30 NOTE — Transfer of Care (Signed)
Immediate Anesthesia Transfer of Care Note ? ?Patient: Nancy Owen ? ?Procedure(s) Performed: LAPAROSCOPIC ROUX-EN-Y GASTRIC BYPASS WITH UPPER ENDOSCOPY, conversion from sleeve ? ?Patient Location: PACU ? ?Anesthesia Type:General ? ?Level of Consciousness: drowsy ? ?Airway & Oxygen Therapy: Patient Spontanous Breathing and Patient connected to face mask oxygen ? ?Post-op Assessment: Report given to RN and Post -op Vital signs reviewed and stable ? ?Post vital signs: Reviewed and stable ? ?Last Vitals:  ?Vitals Value Taken Time  ?BP 127/79 10/30/21 1031  ?Temp    ?Pulse 96 10/30/21 1033  ?Resp 23 10/30/21 1033  ?SpO2 100 % 10/30/21 1033  ?Vitals shown include unvalidated device data. ? ?Last Pain:  ?Vitals:  ? 10/30/21 0614  ?TempSrc: Oral  ?   ? ?  ? ?Complications: No notable events documented. ?

## 2021-10-30 NOTE — Op Note (Signed)
30 October 2021 ?Surgeon: Althea Grimmer. Hassell Done, MD, FACS ?Asst:  Romana Juniper, MD, FACS ?Anesthesia: General endotracheal ?Drains: None ? ?Procedure: Conversion of sleeve gastrectomy to Laparoscopic Roux en Y gastric bypass with 40 cm BP limb and 100 cm Roux limb, antecolic, antegastric, candy cane to the left.  Closure of Peterson's defect. Upper endoscopy.  ? ?Description of Procedure:  The patient was taken to OR 1 at [facility] and given general anesthesia.  The abdomen was prepped with chloroprep and draped sterilely.  A time out was performed.   ? ?The operation began by identifying the ligament of Treitz. I measured 50 cm downstream and divided the bowel with a 6 cm Covidian stapler.  I sutured a Penrose drain along the Roux limb end.  I measured a 1 meter (100 cm) Roux limb and then placed the distal bowels to the BP limb side by side and performed a stapled jejunojejunostomy. The common defect was closed from either end with 4-0 Vicryl using the Endo Stitch. The mesenteric defect was closed with a running 2-0 silk using the Endo Stitch. Tisseel was applied to the suture line. ? ?The omentum was divided with the harmonic scalpel. ? ?The Nathanson retractor was inserted in the left lateral segment of liver was retracted. The foregut dissection ensued.   ?A small recurrent hiatal hernia was dissected free and two sutures posteriorly were divided and two more were placed to better close this defect.  6 cm down the lessor curvature the sleeve gastrectomy was transected with two applications of the stapler-purple covidien.   ? ?The Roux limb was then brought up with the candycane pointed left and a back row of sutures of 2-0 Vicryl were placed. I opened along the right side of each structure and inserted the 4.5 cm stapler to create the gastrojejunostomy. The common defect was closed from either end with 2-0 Vicryl and a second row was placed anterior to that the Ewald tube acting as a stent across the anastomosis. The Penrose  drain was removed. Peterson's defect was closed with 2-0 silk.  ? ?Endoscopy was performed by Dr. Kae Heller ? ?The incisions were injected with Exparel as a TAP block and were closed with 4-0 Monocryl and Dermabond.   ? ?The patient was taken to the recovery room in satisfactory condition. ? ?Matt B. Hassell Done, MD, FACS  ?

## 2021-10-30 NOTE — Anesthesia Postprocedure Evaluation (Signed)
Anesthesia Post Note ? ?Patient: Nancy Owen ? ?Procedure(s) Performed: LAPAROSCOPIC ROUX-EN-Y GASTRIC BYPASS WITH UPPER ENDOSCOPY, conversion from sleeve ? ?  ? ?Patient location during evaluation: PACU ?Anesthesia Type: General ?Level of consciousness: awake ?Pain management: pain level controlled ?Vital Signs Assessment: post-procedure vital signs reviewed and stable ?Respiratory status: spontaneous breathing, nonlabored ventilation, respiratory function stable and patient connected to nasal cannula oxygen ?Cardiovascular status: blood pressure returned to baseline and stable ?Postop Assessment: no apparent nausea or vomiting ?Anesthetic complications: no ? ? ?No notable events documented. ? ?Last Vitals:  ?Vitals:  ? 10/30/21 1508 10/30/21 1605  ?BP: (!) 121/95 (!) 133/92  ?Pulse: 96 100  ?Resp: 18 18  ?Temp: 36.6 ?C 36.7 ?C  ?SpO2: 100% 100%  ?  ?Last Pain:  ?Vitals:  ? 10/30/21 1727  ?TempSrc:   ?PainSc: 8   ? ? ?  ?  ?  ?  ?  ?  ? ?Onyx Schirmer P Oley Lahaie ? ? ? ? ?

## 2021-10-30 NOTE — Progress Notes (Signed)
?  Transition of Care (TOC) Screening Note ? ? ?Patient Details  ?Name: Nancy Owen ?Date of Birth: 1986/04/04 ? ? ?Transition of Care (TOC) CM/SW Contact:    ?Hardeep Reetz, LCSW ?Phone Number: ?10/30/2021, 2:11 PM ? ? ? ?Transition of Care Department [facility]) has reviewed patient and no TOC needs have been identified at this time. We will continue to monitor patient advancement through interdisciplinary progression rounds. If new patient transition needs arise, please place a TOC consult. ? ? ?

## 2021-10-30 NOTE — Interval H&P Note (Signed)
History and Physical Interval Note: ? ?10/30/2021 ?7:17 AM ? ?Nancy Owen  has presented today for surgery, with the diagnosis of morbid obesity.  The various methods of treatment have been discussed with the patient and family. After consideration of risks, benefits and other options for treatment, the patient has consented to  Procedure(s): ?LAPAROSCOPIC ROUX-EN-Y GASTRIC BYPASS WITH UPPER ENDOSCOPY, conversion from sleeve (N/A) as a surgical intervention.  The patient's history has been reviewed, patient examined, no change in status, stable for surgery.  I have reviewed the patient's chart and labs.  Questions were answered to the patient's satisfaction.   ? ? ?Pedro Earls ? ? ?

## 2021-10-30 NOTE — Discharge Instructions (Signed)
GASTRIC BYPASS / SLEEVE  ?Home Care Instructions ? ?These instructions are to help you care for yourself when you go home. ? ?Call: If you have any problems. ?Call [phone] and ask for the surgeon on call ?If you have an emergency related to your surgery please use the ER at [facility].  ?Tell the ER staff that you are a new post-op gastric bypass or gastric sleeve patient ?  ?Signs and symptoms to report: Severe vomiting or nausea ?If you cannot handle clear liquids for longer than 1 day, call your surgeon  ?Abdominal pain which does not get better after taking your pain medication ?Fever greater than 100.4? F and chills ?Heart rate over 100 beats a minute ?Trouble breathing ?Chest pain ? Redness, swelling, drainage, or foul odor at incision (surgical) sites ? If your incisions open or pull apart ?Swelling or pain in calf (lower leg) ?Diarrhea (Loose bowel movements that happen often), frequent watery, uncontrolled bowel movements ?Constipation, (no bowel movements for 3 days) if this happens:  ?Take Milk of Magnesia, 2 tablespoons by mouth, 3 times a day for 2 days if needed ?Stop taking Milk of Magnesia once you have had a bowel movement ?Call your doctor if constipation continues ?Or ?Take Miralax  (instead of Milk of Magnesia) following the label instructions ?Stop taking Miralax once you have had a bowel movement ?Call your doctor if constipation continues ?Anything you think is ?abnormal for you? ?  ?Normal side effects after surgery: Unable to sleep at night or unable to concentrate ?Irritability ?Being tearful (crying) or depressed ?These are common complaints, possibly related to your anesthesia, stress of surgery and change in lifestyle, that usually go away a few weeks after surgery.  If these feelings continue, call your medical doctor.  ?Wound Care: You may have surgical glue, steri-strips, or staples over your incisions after surgery ?Surgical glue:  Looks like a clear film over your incisions  and will wear off a little at a time ?Steri-strips : Adhesive strips of tape over your incisions. You may notice a yellowish color on the skin under the steri-strips. This is used to make the   steri-strips stick better. Do not pull the steri-strips off - let them fall off ?Staples: Staples may be removed before you leave the hospital ?If you go home with staples, call [facility] at for an appointment with your surgeon?s nurse to have staples removed 10 days after surgery, [phone] ?Showering: You may shower two (2) days after your surgery unless your surgeon tells you differently ?Wash gently around incisions with warm soapy water, rinse well, and gently pat dry  ?If you have a drain (tube from your incision), you may need someone to hold this while you shower  ?No tub baths until staples are removed and incisions are healed   ?  ?Medications: Medications should be liquid or crushed if larger than the size of a dime ?Extended release pills (medication that releases a little bit at a time through the day) should not be crushed ?Depending on the size and number of medications you take, you may need to space (take a few throughout the day)/change the time you take your medications so that you do not over-fill your pouch (smaller stomach) ?Make sure you follow-up with your primary care physician to make medication changes needed during rapid weight loss and life-style changes ?If you have diabetes, follow up with the doctor that orders your diabetes medication(s) within one week after surgery and check   your blood sugar regularly. ?Do not drive while taking narcotics (pain medications) ?DO NOT take NSAID'S (Examples of NSAID's include ibuprofen, naproxen)  ?Diet:                    First 2 Weeks ? You will see the nutritionist about two (2) weeks after your surgery. The nutritionist will increase the types of foods you can eat if you are handling liquids well: ?If you have severe vomiting or nausea  and cannot handle clear liquids lasting longer than 1 day, call your surgeon  ?Protein Shake ?Drink at least 2 ounces of shake 5-6 times per day ?Each serving of protein shakes (usually 8 - 12 ounces) should have a minimum of:  ?15 grams of protein  ?And no more than 5 grams of carbohydrate  ?Goal for protein each day: ?Men = 80 grams per day ?Women = 60 grams per day ?Protein powder may be added to fluids such as non-fat milk or Lactaid milk or Soy milk (limit to 35 grams added protein powder per serving) ? ?Hydration ?Slowly increase the amount of water and other clear liquids as tolerated (See Acceptable Fluids) ?Slowly increase the amount of protein shake as tolerated  ? Sip fluids slowly and throughout the day ?May use sugar substitutes in small amounts (no more than 6 - 8 packets per day; i.e. Splenda) ? ?Fluid Goal ?The first goal is to drink at least 8 ounces of protein shake/drink per day (or as directed by the nutritionist);  See handout from pre-op Bariatric Education Class for examples of protein shake/drink.   ?Slowly increase the amount of protein shake you drink as tolerated ?You may find it easier to slowly sip shakes throughout the day ?It is important to get your proteins in first ?Your fluid goal is to drink 64 - 100 ounces of fluid daily ?It may take a few weeks to build up to this ?32 oz (or more) should be clear liquids  ?And  ?32 oz (or more) should be full liquids (see below for examples) ?Liquids should not contain sugar, caffeine, or carbonation ? ?Clear Liquids: ?Water or Sugar-free flavored water (i.e. Fruit H2O, Propel) ?Decaffeinated coffee or tea (sugar-free) ?W[place]ley Krempasky Lite, Wyler?s Lite, Minute Maid Lite ?Sugar-free Jell-O ?Bouillon or broth ?Sugar-free Popsicle:   *Less than 20 calories each; Limit 1 per day ? ?Full Liquids: ?Protein Shakes/Drinks + 2 choices per day of other full liquids ?Full liquids must be: ?No More Than 12 grams of Carbs per serving  ?No More Than 3 grams of Fat  per serving ?Strained low-fat cream soup ?Non-Fat milk ?Fat-free Lactaid Milk ?Sugar-free yogurt (Dannon Lite & Fit, Greek yogurt) ? ? ? ?  ?Vitamins and Minerals Start 1 day after surgery unless otherwise directed by your surgeon ?Bariatric Specific Complete Multivitamins ?Chewable Calcium Citrate with Vitamin D-3 ?(Example: 3 Chewable Calcium Plus 600 with Vitamin D-3) ?Take 500 mg three (3) times a day for a total of 1500 mg each day ?Do not take all 3 doses of calcium at one time as it may cause constipation, and you can only absorb 500 mg  at a time  ?Do not mix multivitamins containing iron with calcium supplements; take 2 hours apart ? ?Menstruating women and those at risk for anemia (a blood disease that causes weakness) may need extra iron ?Talk with your doctor to see if you need more iron ?If you need extra iron: Total daily Iron recommendation (including Vitamins) is 50 to 100   mg Iron/day ?Do not stop taking or change any vitamins or minerals until you talk to your nutritionist or surgeon ?Your nutritionist and/or surgeon must approve all vitamin and mineral supplements ?  ?Activity and Exercise: It is important to continue walking at home.  Limit your physical activity as instructed by your doctor.  During this time, use these guidelines: ?Do not lift anything greater than ten (10) pounds for at least two (2) weeks ?Do not go back to work or drive until your surgeon says you can ?You may have sex when you feel comfortable  ?It is VERY important for female patients to use a reliable birth control method; fertility often increases after surgery  ?Do not get pregnant for at least 18 months ?Start exercising as soon as your doctor tells you that you can ?Make sure your doctor approves any physical activity ?Start with a simple walking program ?Walk 5-15 minutes each day, 7 days per week.  ?Slowly increase until you are walking 30-45 minutes per day ?Consider joining our BELT program. [phone] or email  [email or web address] ?  ?Special Instructions Things to remember: ? ?Use your CPAP when sleeping if this applies to you, do not stop the use of CPAP unless directed by physician after a sleep study ?[place]

## 2021-10-30 NOTE — Anesthesia Procedure Notes (Signed)
Procedure Name: Intubation ?Date/Time: 10/30/2021 7:46 AM ?Performed by: Lavina Hamman, CRNA ?Pre-anesthesia Checklist: Patient identified, Emergency Drugs available, Suction available, Patient being monitored and Timeout performed ?Patient Re-evaluated:Patient Re-evaluated prior to induction ?Oxygen Delivery Method: Circle system utilized ?Preoxygenation: Pre-oxygenation with 100% oxygen ?Induction Type: IV induction ?Ventilation: Mask ventilation without difficulty ?Laryngoscope Size: Mac and 3 ?Grade View: Grade II ?Tube type: Oral ?Tube size: 7.0 mm ?Number of attempts: 1 ?Airway Equipment and Method: Stylet ?Placement Confirmation: ETT inserted through vocal cords under direct vision, positive ETCO2, CO2 detector and breath sounds checked- equal and bilateral ?Secured at: 21 cm ?Tube secured with: Tape ?Dental Injury: Teeth and Oropharynx as per pre-operative assessment  ?Difficulty Due To: Difficulty was anticipated, Difficult Airway- due to limited oral opening, Difficult Airway- due to dentition and Difficult Airway- due to reduced neck mobility ?Comments: ATOI ? ? ? ? ?

## 2021-10-30 NOTE — Progress Notes (Signed)
Patient ambulated 45f but became dizzy. Patient was able to ambulate back to the bed. Call light within reach and bed in lowest position.  ?

## 2021-10-30 NOTE — Anesthesia Preprocedure Evaluation (Signed)
Anesthesia Evaluation  ?Patient identified by MRN, date of birth, ID band ?Patient awake ? ? ? ?Reviewed: ?Allergy & Precautions, NPO status , Patient's Chart, lab work & pertinent test results ? ?Airway ?Mallampati: III ? ?TM Distance: >3 FB ?Neck ROM: Full ? ? ? Dental ?no notable dental hx. ? ?  ?Pulmonary ?sleep apnea and Continuous Positive Airway Pressure Ventilation ,  ?  ?Pulmonary exam normal ? ? ? ? ? ? ? Cardiovascular ?negative cardio ROS ?Normal cardiovascular exam ? ? ?  ?Neuro/Psych ? Headaches, negative psych ROS  ? GI/Hepatic ?Neg liver ROS, hiatal hernia, GERD  Controlled,  ?Endo/Other  ?Morbid obesity ? Renal/GU ?negative Renal ROS  ? ?  ?Musculoskeletal ?negative musculoskeletal ROS ?(+)  ? Abdominal ?  ?Peds ? Hematology ?negative hematology ROS ?(+)   ?Anesthesia Other Findings ?morbid obesity ? Reproductive/Obstetrics ?hcg negative ? ?  ? ? ? ? ? ? ? ? ? ? ? ? ? ?  ?  ? ? ? ? ? ? ? ? ?Anesthesia Physical ?Anesthesia Plan ? ?ASA: 3 ? ?Anesthesia Plan: General  ? ?Post-op Pain Management:   ? ?Induction: Intravenous ? ?PONV Risk Score and Plan: 4 or greater and Ondansetron, Dexamethasone, Midazolam, Scopolamine patch - Pre-op and Treatment may vary due to age or medical condition ? ?Airway Management Planned: Oral ETT ? ?Additional Equipment:  ? ?Intra-op Plan:  ? ?Post-operative Plan: Extubation in OR ? ?Informed Consent: I have reviewed the patients History and Physical, chart, labs and discussed the procedure including the risks, benefits and alternatives for the proposed anesthesia with the patient or authorized representative who has indicated his/her understanding and acceptance.  ? ? ? ?Dental advisory given ? ?Plan Discussed with: CRNA ? ?Anesthesia Plan Comments:   ? ? ? ? ? ? ?Anesthesia Quick Evaluation ? ?

## 2021-10-30 NOTE — Progress Notes (Signed)

## 2021-10-31 ENCOUNTER — Encounter (HOSPITAL_COMMUNITY): Payer: Self-pay | Admitting: Surgery

## 2021-10-31 LAB — CBC WITH DIFFERENTIAL/PLATELET
Abs Immature Granulocytes: 0.09 10*3/uL — ABNORMAL HIGH (ref 0.00–0.07)
Basophils Absolute: 0 10*3/uL (ref 0.0–0.1)
Basophils Relative: 0 %
Eosinophils Absolute: 0 10*3/uL (ref 0.0–0.5)
Eosinophils Relative: 0 %
HCT: 28.1 % — ABNORMAL LOW (ref 36.0–46.0)
Hemoglobin: 8 g/dL — ABNORMAL LOW (ref 12.0–15.0)
Immature Granulocytes: 1 %
Lymphocytes Relative: 13 %
Lymphs Abs: 2.6 10*3/uL (ref 0.7–4.0)
MCH: 19.9 pg — ABNORMAL LOW (ref 26.0–34.0)
MCHC: 28.5 g/dL — ABNORMAL LOW (ref 30.0–36.0)
MCV: 69.7 fL — ABNORMAL LOW (ref 80.0–100.0)
Monocytes Absolute: 1.3 10*3/uL — ABNORMAL HIGH (ref 0.1–1.0)
Monocytes Relative: 6 %
Neutro Abs: 16 10*3/uL — ABNORMAL HIGH (ref 1.7–7.7)
Neutrophils Relative %: 80 %
Platelets: 390 10*3/uL (ref 150–400)
RBC: 4.03 MIL/uL (ref 3.87–5.11)
RDW: 18.5 % — ABNORMAL HIGH (ref 11.5–15.5)
WBC: 20 10*3/uL — ABNORMAL HIGH (ref 4.0–10.5)
nRBC: 0 % (ref 0.0–0.2)

## 2021-10-31 MED ORDER — SIMETHICONE 80 MG PO CHEW: 80.0000 mg | CHEWABLE_TABLET | Freq: Four times a day (QID) | ORAL | Status: DC | PRN

## 2021-10-31 NOTE — Plan of Care (Signed)
?  Problem: Education: ?Goal: Knowledge of General Education information will improve ?Description: Including pain rating scale, medication(s)/side effects and non-pharmacologic comfort measures ?10/31/2021 1804 by Warren Lacy, RN ?Outcome: Progressing ?10/31/2021 1614 by Warren Lacy, RN ?Outcome: Progressing ?  ?Problem: Health Behavior/Discharge Planning: ?Goal: Ability to manage health-related needs will improve ?10/31/2021 1804 by Warren Lacy, RN ?Outcome: Progressing ?10/31/2021 1614 by Warren Lacy, RN ?Outcome: Progressing ?  ?Problem: Clinical Measurements: ?Goal: Ability to maintain clinical measurements within normal limits will improve ?10/31/2021 1804 by Warren Lacy, RN ?Outcome: Progressing ?10/31/2021 1614 by Warren Lacy, RN ?Outcome: Progressing ?Goal: Will remain free from infection ?10/31/2021 1804 by Warren Lacy, RN ?Outcome: Progressing ?10/31/2021 1614 by Warren Lacy, RN ?Outcome: Progressing ?Goal: Diagnostic test results will improve ?10/31/2021 1804 by Warren Lacy, RN ?Outcome: Progressing ?10/31/2021 1614 by Warren Lacy, RN ?Outcome: Progressing ?Goal: Respiratory complications will improve ?10/31/2021 1804 by Warren Lacy, RN ?Outcome: Progressing ?10/31/2021 1614 by Warren Lacy, RN ?Outcome: Progressing ?Goal: Cardiovascular complication will be avoided ?10/31/2021 1804 by Warren Lacy, RN ?Outcome: Progressing ?10/31/2021 1614 by Warren Lacy, RN ?Outcome: Progressing ?  ?Problem: Activity: ?Goal: Risk for activity intolerance will decrease ?10/31/2021 1804 by Warren Lacy, RN ?Outcome: Progressing ?10/31/2021 1614 by Warren Lacy, RN ?Outcome: Progressing ?  ?Problem: Nutrition: ?Goal: Adequate nutrition will be maintained ?10/31/2021 1804 by Warren Lacy, RN ?Outcome: Progressing ?10/31/2021 1614 by Warren Lacy, RN ?Outcome: Progressing ?  ?Problem: Coping: ?Goal: Level of anxiety will decrease ?10/31/2021  1804 by Warren Lacy, RN ?Outcome: Progressing ?10/31/2021 1614 by Warren Lacy, RN ?Outcome: Progressing ?  ?Problem: Elimination: ?Goal: Will not experience complications related to bowel motility ?10/31/2021 1804 by Warren Lacy, RN ?Outcome: Progressing ?10/31/2021 1614 by Warren Lacy, RN ?Outcome: Progressing ?Goal: Will not experience complications related to urinary retention ?10/31/2021 1804 by Warren Lacy, RN ?Outcome: Progressing ?10/31/2021 1614 by Warren Lacy, RN ?Outcome: Progressing ?  ?Problem: Pain Managment: ?Goal: General experience of comfort will improve ?10/31/2021 1804 by Warren Lacy, RN ?Outcome: Progressing ?10/31/2021 1614 by Warren Lacy, RN ?Outcome: Progressing ?  ?Problem: Safety: ?Goal: Ability to remain free from injury will improve ?10/31/2021 1804 by Warren Lacy, RN ?Outcome: Progressing ?10/31/2021 1614 by Warren Lacy, RN ?Outcome: Progressing ?  ?Problem: Skin Integrity: ?Goal: Risk for impaired skin integrity will decrease ?10/31/2021 1804 by Warren Lacy, RN ?Outcome: Progressing ?10/31/2021 1614 by Warren Lacy, RN ?Outcome: Progressing ?  ?

## 2021-10-31 NOTE — Progress Notes (Signed)
Patient alert and oriented, pain is controlled. Patient is tolerating fluids, advanced to protein shake today, patient is tolerating well. Reviewed Gastric Bypass discharge instructions with patient and patient is able to articulate understanding. Provided information on BELT program, Support Group and [facility] outpatient pharmacy. All questions answered, will continue to monitor.    

## 2021-10-31 NOTE — Progress Notes (Signed)
Patient ID: Nancy Owen, female   DOB: 1985-11-28, 36 y.o.   MRN: [id number] ?[facility] Progress Note:   1 Day Post-Op  ?Subjective: ?Mental status is clear.  Complaints sore. ?Objective: ?Vital signs in last 24 hours: ?Temp:  [97.5 ?F (36.4 ?C)-99.5 ?F (37.5 ?C)] 99.1 ?F (37.3 ?C) (04/19 1013) ?Pulse Rate:  [88-102] 93 (04/19 1013) ?Resp:  [16-20] 18 (04/19 1013) ?BP: (115-142)/(60-95) 115/60 (04/19 1013) ?SpO2:  [99 %-100 %] 99 % (04/19 1013) ? ?Intake/Output from previous day: ?04/18 0701 - 04/19 0700 ?In: 2568.6 [P.O.:300; I.V.:2268.6] ?Out: 2750 [Urine:2675; Blood:75] ?Intake/Output this shift: ?Total I/O ?In: -  ?Out: 250 [Urine:250] ? ?Physical Exam: Work of breathing is normal.  Beginning clear liquids ? ?Lab Results:  ?Results for orders placed or performed during the hospital encounter of 10/30/21 (from the past 48 hour(s))  ?Pregnancy, urine     Status: None  ? Collection Time: 10/30/21  5:27 AM  ?Result Value Ref Range  ? Preg Test, Ur NEGATIVE NEGATIVE  ?  Comment:        ?THE SENSITIVITY OF THIS ?METHODOLOGY IS >20 mIU/mL. ?Performed at [facility], [place] [address] [place], [place] [address] ?  ?Type and screen     Status: None  ? Collection Time: 10/30/21  6:01 AM  ?Result Value Ref Range  ? ABO/RH(D) O POS   ? Antibody Screen NEG   ? Sample Expiration    ?  11/02/2021,2359 ?Performed at [facility], [place] [address], [place], [place] [address] ?  ?Sample to Blood Bank     Status: None  ? Collection Time: 10/30/21  6:10 AM  ?Result Value Ref Range  ? Blood Bank Specimen SAMPLE AVAILABLE FOR TESTING   ? Sample Expiration    ?  11/02/2021,2359 ?Performed at [facility], [place] [address] [place], [place] [address] ?  ?ABO/Rh     Status: None  ? Collection Time: 10/30/21  7:00 AM  ?Result Value Ref Range  ? ABO/RH(D)    ?  O POS ?Performed at [facility], [place] [address], [place], [place] [address] ?  ?CBC      Status: Abnormal  ? Collection Time: 10/30/21  2:02 PM  ?Result Value Ref Range  ? WBC 21.1 (H) 4.0 - 10.5 K/uL  ? RBC 3.84 (L) 3.87 - 5.11 MIL/uL  ? Hemoglobin 7.7 (L) 12.0 - 15.0 g/dL  ?  Comment: Reticulocyte Hemoglobin testing ?may be clinically indicated, ?consider ordering this additional ?test [id number] ?  ? HCT 27.2 (L) 36.0 - 46.0 %  ? MCV 70.8 (L) 80.0 - 100.0 fL  ? MCH 20.1 (L) 26.0 - 34.0 pg  ? MCHC 28.3 (L) 30.0 - 36.0 g/dL  ? RDW 18.6 (H) 11.5 - 15.5 %  ? Platelets 368 150 - 400 K/uL  ? nRBC 0.0 0.0 - 0.2 %  ?  Comment: Performed at [facility], [place] [address] [place], [place] [address]  ?Creatinine, serum     Status: None  ? Collection Time: 10/30/21  2:02 PM  ?Result Value Ref Range  ? Creatinine, Ser 0.69 0.44 - 1.00 mg/dL  ? GFR, Estimated >60 >60 mL/min  ?  Comment: (NOTE) ?Calculated using the CKD-EPI Creatinine Equation (2021) ?Performed at [facility], [place] [address] ?[place], [place] [address] ?  ?CBC with Differential     Status: Abnormal  ? Collection Time: 10/31/21  4:45 AM  ?Result Value Ref  Range  ? WBC 20.0 (H) 4.0 - 10.5 K/uL  ? RBC 4.03 3.87 - 5.11 MIL/uL  ? Hemoglobin 8.0 (L) 12.0 - 15.0 g/dL  ?  Comment: Reticulocyte Hemoglobin testing ?may be clinically indicated, ?consider ordering this additional ?test [id number] ?  ? HCT 28.1 (L) 36.0 - 46.0 %  ? MCV 69.7 (L) 80.0 - 100.0 fL  ? MCH 19.9 (L) 26.0 - 34.0 pg  ? MCHC 28.5 (L) 30.0 - 36.0 g/dL  ? RDW 18.5 (H) 11.5 - 15.5 %  ? Platelets 390 150 - 400 K/uL  ? nRBC 0.0 0.0 - 0.2 %  ? Neutrophils Relative % 80 %  ? Neutro Abs 16.0 (H) 1.7 - 7.7 K/uL  ? Lymphocytes Relative 13 %  ? Lymphs Abs 2.6 0.7 - 4.0 K/uL  ? Monocytes Relative 6 %  ? Monocytes Absolute 1.3 (H) 0.1 - 1.0 K/uL  ? Eosinophils Relative 0 %  ? Eosinophils Absolute 0.0 0.0 - 0.5 K/uL  ? Basophils Relative 0 %  ? Basophils Absolute 0.0 0.0 - 0.1 K/uL  ? Immature Granulocytes 1 %  ? Abs Immature Granulocytes 0.09 (H) 0.00 - 0.07 K/uL  ?   Comment: Performed at [facility], [place] [address] [place], [place] [address]  ? ? ?Radiology/Results: ?No results found. ? ?Anti-infectives: ?Anti-infectives (From admission, onward)  ? ? Start     Dose/Rate Route Frequency Ordered Stop  ? 10/30/21 0600  cefoTEtan (CEFOTAN) 2 g in sodium chloride 0.9 % 100 mL IVPB       ? 2 g ?200 mL/hr over 30 Minutes Intravenous On call to O.R. 10/30/21 4235 10/30/21 0735  ? ?  ? ? ?Assessment/Plan: ?Problem List: ?Patient Active Problem List  ? Diagnosis Date Noted  ? S/P gastric bypass 10/30/2021  ? Symptomatic cholelithiasis   ? Lymphedema 01/25/2020  ? Pain in limb 12/21/2019  ? Swelling of limb 12/21/2019  ? S/P bilateral breast reduction 11/24/2019  ? Macromastia 09/09/2019  ? Upper back pain 09/09/2019  ? Hx of fracture of tibia 09/09/2019  ? History of bariatric surgery 05/09/2019  ? History of syphilis 05/09/2019  ? Herpes genitalia 05/09/2019  ? Morbid obesity with body mass index of 40.0-49.9 ([place]) 07/25/2015  ? Well woman exam 01/03/2015  ? OSA (obstructive sleep apnea) 08/02/2011  ? Insomnia 08/02/2011  ? Obesity 08/02/2011  ? ? ?Doing well postop ?1 Day Post-Op  ? ? LOS: 1 day  ? ?Matt B. Hassell Done, MD, FACS ? ?[facility] ?[phone] to reach the surgeon on call.   ? ?10/31/2021 10:42 AM  ?

## 2021-10-31 NOTE — Progress Notes (Signed)
Pt refused to walk at this time. Educated pt the importance of mobility after surgery. Pt stated she will walk later.  ?

## 2021-10-31 NOTE — Plan of Care (Signed)
Uneventful day, ambulate halls, pain is well managed. Spouse at the bed in the a.m. started on protein shakes @ 1300.  ?

## 2021-11-01 ENCOUNTER — Other Ambulatory Visit (HOSPITAL_COMMUNITY): Payer: Self-pay

## 2021-11-01 LAB — CBC WITH DIFFERENTIAL/PLATELET
Abs Immature Granulocytes: 0.04 10*3/uL (ref 0.00–0.07)
Basophils Absolute: 0 10*3/uL (ref 0.0–0.1)
Basophils Relative: 0 %
Eosinophils Absolute: 0.1 10*3/uL (ref 0.0–0.5)
Eosinophils Relative: 0 %
HCT: 27.6 % — ABNORMAL LOW (ref 36.0–46.0)
Hemoglobin: 7.6 g/dL — ABNORMAL LOW (ref 12.0–15.0)
Immature Granulocytes: 0 %
Lymphocytes Relative: 31 %
Lymphs Abs: 4.1 10*3/uL — ABNORMAL HIGH (ref 0.7–4.0)
MCH: 19.5 pg — ABNORMAL LOW (ref 26.0–34.0)
MCHC: 27.5 g/dL — ABNORMAL LOW (ref 30.0–36.0)
MCV: 70.8 fL — ABNORMAL LOW (ref 80.0–100.0)
Monocytes Absolute: 1.1 10*3/uL — ABNORMAL HIGH (ref 0.1–1.0)
Monocytes Relative: 8 %
Neutro Abs: 8.1 10*3/uL — ABNORMAL HIGH (ref 1.7–7.7)
Neutrophils Relative %: 61 %
Platelets: 365 10*3/uL (ref 150–400)
RBC: 3.9 MIL/uL (ref 3.87–5.11)
RDW: 18.6 % — ABNORMAL HIGH (ref 11.5–15.5)
WBC: 13.4 10*3/uL — ABNORMAL HIGH (ref 4.0–10.5)
nRBC: 0 % (ref 0.0–0.2)

## 2021-11-01 MED ORDER — ONDANSETRON 4 MG PO TBDP
4.0000 mg | ORAL_TABLET | Freq: Four times a day (QID) | ORAL | 0 refills | Status: DC | PRN
  Filled 2021-11-01: qty 20, 5d supply, fill #0

## 2021-11-01 MED ORDER — OXYCODONE HCL 5 MG PO TABS
5.0000 mg | ORAL_TABLET | Freq: Four times a day (QID) | ORAL | 0 refills | Status: DC | PRN
  Filled 2021-11-01: qty 10, 3d supply, fill #0

## 2021-11-01 MED ORDER — PANTOPRAZOLE SODIUM 40 MG PO TBEC
40.0000 mg | DELAYED_RELEASE_TABLET | Freq: Every day | ORAL | 0 refills | Status: DC
  Filled 2021-11-01: qty 30, 30d supply, fill #0

## 2021-11-01 NOTE — Discharge Summary (Signed)
Physician Discharge Summary  ?Patient ID: ?Nancy Owen ?MRN: [id number] ?DOB/AGE: 03/09/1986 36 y.o. ? ?PCP: [name], MD ? ?Admit date: 10/30/2021 ?Discharge date: 11/01/2021 ? 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 ?Discharge Diagnoses:  same  ?Principal Problem: ?  S/P gastric bypass ? ? 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 ?Discharged Condition: improved ? ?Hospital Course:   had surgery on Tuesday.  Begun on liquids and advanced and ready for discharge on Thursday.   ? ?Consults: none ? ?Significant Diagnostic Studies: none ? ? ? ?Discharge Exam: ?Blood pressure (!) 131/93, pulse 94, temperature 97.8 ?F (36.6 ?C), temperature source Oral, resp. rate 16, height '5\' 1"'$  (1.549 m), weight 106.1 kg, last menstrual period 10/10/2021, SpO2 98 %. ?Incisions ok ? ?Disposition: Discharge disposition: 01-Home or Self Care ? ? ? ? ? ? ?Discharge Instructions   ? ? Ambulate hourly while awake   Complete by: As directed ?  ? Call MD for:  difficulty breathing, headache or visual disturbances   Complete by: As directed ?  ? Call MD for:  persistant dizziness or light-headedness   Complete by: As directed ?  ? Call MD for:  persistant nausea and vomiting   Complete by: As directed ?  ? Call MD for:  redness, tenderness, or signs of infection (pain, swelling, redness, odor or green/yellow discharge around incision site)   Complete by: As directed ?  ? Call MD for:  severe uncontrolled pain   Complete by: As directed ?  ? Call MD for:  temperature >101 F   Complete by: As directed ?  ? Diet bariatric full liquid   Complete by: As directed ?  ? Incentive spirometry   Complete by: As directed ?  ? Perform hourly while awake  ? ?  ? ?Allergies as of 11/01/2021   ?No Known Allergies ?  ? ?  ?Medication List  ?  ? ?TAKE these medications   ? ?acetaminophen 500 MG tablet ?Commonly known as: TYLENOL ?Take 500 mg by mouth every 6 (six) hours as needed  for mild pain. ?  ?calcium carbonate 500 MG chewable tablet ?Commonly known as: TUMS - dosed in mg elemental calcium ?Chew 1 tablet by mouth 2 (two) times daily as needed for indigestion or heartburn. ?  ?etonogestrel-ethinyl estradiol 0.12-0.015 MG/24HR vaginal ring ?Commonly known as: NUVARING ?Insert vaginally and leave in place for 3 consecutive weeks, then remove for 1 week. ?  ?FLINTSTONES MULTIVITAMIN PO ?Take 2 tablets by mouth daily. Gummies ?  ?HAIR SKIN NAILS PO ?Take 2 capsules by mouth daily with lunch. Gummie ?  ?loratadine 10 MG tablet ?Commonly known as: CLARITIN ?Take 10 mg by mouth daily as needed for allergies. ?  ?ondansetron 4 MG disintegrating tablet ?Commonly known as: ZOFRAN-ODT ?Take 1 tablet (4 mg total) by mouth every 6 (six) hours as needed for nausea or vomiting. ?  ?oxyCODONE 5 MG immediate release tablet ?Commonly known as: Oxy IR/ROXICODONE ?Take 1 tablet (5 mg total) by mouth every 6 (six) hours as needed for severe pain. ?  ?pantoprazole 40 MG tablet ?Commonly known as: PROTONIX ?Take 1 tablet (40 mg total) by mouth daily. ?  ?phentermine 37.5 MG tablet ?Commonly known as: ADIPEX-P ?Take 1 tablet (37.5 mg total) by mouth daily before breakfast. ?  ?valACYclovir 500 MG tablet ?Commonly known as: VALTREX ?Take 500 mg by mouth daily as needed (outbreak). ?  ? ?  ? ?  Follow-up Information   ? ? Johnathan Hausen, MD. Daphane Shepherd on 11/21/2021.   ?Specialty: General Surgery ?Why: at 10:15am in the [facility]. Please arrive 15 minutes prior to your appointment time.  Thank you. ?Contact information: ?[address] ?[address] ?[place] [address] ?[phone] ? ? ?  ?  ? ? Johnathan Hausen, MD. Daphane Shepherd on 12/19/2021.   ?Specialty: General Surgery ?Why: at 2:15pm in the [facility].  Please arrive 15 minutes prior to your appointment time.  Thank you. ?Contact information: ?[address] ?[address] ?[place] [address] ?[phone] ? ? ?  ?  ? ?  ?  ? ?  ? ? ?Signed: ?Pedro Earls ?11/01/2021, 8:16 AM ?  ? ?

## 2021-11-02 ENCOUNTER — Telehealth (HOSPITAL_COMMUNITY): Payer: Self-pay | Admitting: *Deleted

## 2021-11-02 NOTE — Telephone Encounter (Signed)
1.  Tell me about your pain and pain management? ?Pt c/o right lower abdominal incisional pain with movement and exertion.  Pt states that the pain is tolerable. Discussed with patient to try and splint her abdomen with changing positions to assist with the discomfort.  Encouraged pt to try options and/or contact [facility] if still concerned.  ? ?2.  Let's talk about fluid intake.  How much total fluid are you taking in? ?Pt states that she is working to meet goal of 64 oz of fluid today.  Pt has been able to consume approx. 30oz of fluid per day since surgery.  Discussed a plan for pt to meet fluid goals.  Pt instructed to assess status and suggestions daily utilizing Hydration Action Plan on discharge folder and to call [facility] if in the "red zone".  ? ?3.  How much protein have you taken in the last 2 days? ?Pt states she is meeting her goal of 60g of protein each day with the protein shakes. ? ?4.  Have you had nausea?  Tell me about when have experienced nausea and what you did to help? ?Pt denies nausea. ?  ?5.  Has the frequency or color changed with your urine? ?Pt states that she is urinating "fine" with no changes in frequency or urgency.   ?  ?6.  Tell me what your incisions look like? ?"Incisions look fine". Pt denies a fever, chills.  Pt states incisions are not swollen, open, or draining.  Pt encouraged to call [facility] if incisions change. ?  ?7.  Have you been passing gas? BM? ?Pt states that she is having BMs. Last BM 11/02/21.   ?  ?8.  If a problem or question were to arise who would you call?  Do you know contact numbers for [facility], [facility], and [facility]? ?Pt denies dehydration symptoms.  Pt can describe s/sx of dehydration.  Pt knows to call [facility] for surgical, [facility] for nutrition, and [facility] for non-urgent questions or concerns. ?  ?9.  How has the walking going? ?Pt states she is walking around and able to be active without difficulty. ?  ?10. Are you still using your incentive spirometer?  If so, how often? ?Pt states that  she is not doing the I.S. Pt encouraged to use incentive spirometer, at least 10x every hour while awake until she sees the surgeon. ? ?11.  How are your vitamins and calcium going?  How are you taking them? ?Pt states that she is taking her supplements and vitamins without difficulty. Reinforced education about taking supplements at least two hours apart. ? ?Reminded patient that the first 30 days post-operatively are important for successful recovery.  Practice good hand hygiene, wearing a mask when appropriate (since optional in most places), and minimizing exposure to people who live outside of the home, especially if they are exhibiting any respiratory, GI, or illness-like symptoms.   ? ?

## 2021-11-13 ENCOUNTER — Encounter: Payer: BC Managed Care – PPO | Attending: Surgery | Admitting: Skilled Nursing Facility1

## 2021-11-13 DIAGNOSIS — E669 Obesity, unspecified: Secondary | ICD-10-CM | POA: Insufficient documentation

## 2021-11-15 ENCOUNTER — Encounter: Payer: Self-pay | Admitting: Skilled Nursing Facility1

## 2021-11-15 NOTE — Progress Notes (Addendum)
2 Week Post-Operative Nutrition Class ?  ?Patient was seen on 11/13/2021 for Post-Operative Nutrition education at the [facility].  ?  ?Surgery date: 10/30/2021 ?Surgery type: RYGB ?Start weight at [facility]: 239.2 ?Weight today: 227.3 ?  ?Body Composition Scale 11/13/2021  ?Current Body Weight 227.3  ?Total Body Fat % 44.0  ?Visceral Fat 13  ?Fat-Free Mass % 55.9  ? Total Body Water % 42.4  ?Muscle-Mass lbs 30.3  ?BMI 43.0  ?Body Fat Displacement   ?       Torso  lbs 62.0  ?       Left Leg  lbs 12.4  ?       Right Leg  lbs 12.4  ?       Left Arm  lbs 6.2  ?       Right Arm   lbs 6.2  ? ?Clinical: ?Medical History: sleep apnea, cholelithiasis, insomnia, GERD ?Medications and Supplements: phentermine, loratadine, nuvaring, amitriptyline, acetaminophen prn, valerian root, multivitamins: Flintstones complete, Hair-skin-nails, emergen-c ?Relevant labs: ?Notable symptoms: GE reflux symptoms ?Drug allergies: none known ?Food allergies: none known ?  ?The following the learning objectives were met by the patient during this course: ?Identifies Phase 3 (Soft, High Proteins) Dietary Goals and will begin from 2 weeks post-operatively to 2 months post-operatively ?Identifies appropriate sources of fluids and proteins  ?Identifies appropriate fat sources and healthy verses unhealthy fat types   ?States protein recommendations and appropriate sources post-operatively ?Identifies the need for appropriate texture modifications, mastication, and bite sizes when consuming solids ?Identifies appropriate fat consumption and sources ?Identifies appropriate multivitamin and calcium sources post-operatively ?Describes the need for physical activity post-operatively and will follow MD recommendations ?States when to call healthcare provider regarding medication questions or post-operative complications ?  ?Handouts given during class include: ?Phase 3A: Soft, High Protein Diet Handout ?Phase 3 High Protein  Meals ?Healthy Fats ?  ?Follow-Up Plan: ?Patient will follow-up at [facility] in 6 weeks for 2 month post-op nutrition visit for diet advancement per MD.  ?

## 2021-11-19 ENCOUNTER — Telehealth: Payer: Self-pay | Admitting: Skilled Nursing Facility1

## 2021-11-19 NOTE — Telephone Encounter (Signed)
RD called pt to verify fluid intake once starting soft, solid proteins 2 week post-bariatric surgery.  ? ?Daily Fluid intake: 64 oz ?Daily Protein intake: 60 oz ?Bowel Habits: normal ? ?Concerns/issues:   ?

## 2021-11-26 ENCOUNTER — Telehealth: Payer: Self-pay

## 2021-11-26 NOTE — Telephone Encounter (Signed)
Called Nancy Owen back and told her since it's been a year she would need to be seen, she's coming in on 11/28/21 to see Dr. Sharlet Salina. ? ? ?

## 2021-11-26 NOTE — Telephone Encounter (Signed)
Nancy Owen called in needed a refill for the Nuva ring and a prescription for a yeast infection.  ?

## 2021-11-28 ENCOUNTER — Ambulatory Visit (INDEPENDENT_AMBULATORY_CARE_PROVIDER_SITE_OTHER): Payer: BC Managed Care – PPO | Admitting: Obstetrics

## 2021-11-28 ENCOUNTER — Encounter: Payer: Self-pay | Admitting: Obstetrics

## 2021-11-28 VITALS — BP 128/80 | Ht 61.0 in | Wt 224.0 lb

## 2021-11-28 DIAGNOSIS — Z01419 Encounter for gynecological examination (general) (routine) without abnormal findings: Secondary | ICD-10-CM

## 2021-11-28 DIAGNOSIS — Z9889 Other specified postprocedural states: Secondary | ICD-10-CM

## 2021-11-28 DIAGNOSIS — N760 Acute vaginitis: Secondary | ICD-10-CM | POA: Diagnosis not present

## 2021-11-28 DIAGNOSIS — Z113 Encounter for screening for infections with a predominantly sexual mode of transmission: Secondary | ICD-10-CM

## 2021-11-28 DIAGNOSIS — N898 Other specified noninflammatory disorders of vagina: Secondary | ICD-10-CM

## 2021-11-28 DIAGNOSIS — N920 Excessive and frequent menstruation with regular cycle: Secondary | ICD-10-CM

## 2021-11-28 DIAGNOSIS — Z8619 Personal history of other infectious and parasitic diseases: Secondary | ICD-10-CM | POA: Diagnosis not present

## 2021-11-28 DIAGNOSIS — Z6841 Body Mass Index (BMI) 40.0 and over, adult: Secondary | ICD-10-CM

## 2021-11-28 DIAGNOSIS — B009 Herpesviral infection, unspecified: Secondary | ICD-10-CM

## 2021-11-28 DIAGNOSIS — Z3044 Encounter for surveillance of vaginal ring hormonal contraceptive device: Secondary | ICD-10-CM

## 2021-11-28 DIAGNOSIS — D500 Iron deficiency anemia secondary to blood loss (chronic): Secondary | ICD-10-CM

## 2021-11-28 MED ORDER — ETONOGESTREL-ETHINYL ESTRADIOL 0.12-0.015 MG/24HR VA RING: VAGINAL_RING | VAGINAL | 3 refills | Status: DC

## 2021-11-28 MED ORDER — FLUCONAZOLE 150 MG PO TABS: ORAL_TABLET | ORAL | 0 refills | Status: DC

## 2021-11-28 MED ORDER — VALACYCLOVIR HCL 500 MG PO TABS: 500.0000 mg | ORAL_TABLET | Freq: Every day | ORAL | 0 refills | Status: DC | PRN

## 2021-11-28 NOTE — Progress Notes (Addendum)
Chief Complaint  Patient presents with   Annual Exam   Patient Nancy Owen is an 36 y.o. year old [id number] Patient's last menstrual period was 11/07/2021 (exact date). currently Nuvaring for contraception who presents for annual. S/p gastric surgery 4/18. From 235 to 224.  Patient is exercising regularly. Patient does perform self breast exam. Patient reports maternal aunt with breast cancer over 41.  Patient takes a multivitamin. Patient is content with the birth control method. Patient is sexually active with no problems. Currently dealing with yeast symptoms with itching and odor. This has been going on about 4 days. Has been taking honey pot cream OTC.   Patients pronouns are she/her/hers   Primary care provider: [name], MD  Review of Systems  Constitutional:  Positive for activity change, fatigue and unexpected weight change (intended). Negative for appetite change, chills, diaphoresis and fever.  HENT:  Negative for congestion, dental problem, drooling, ear discharge, ear pain, facial swelling, hearing loss, mouth sores, nosebleeds, postnasal drip, rhinorrhea, sinus pressure, sinus pain, sneezing, sore throat, tinnitus, trouble swallowing and voice change.   Eyes:  Negative for photophobia, pain, discharge, redness, itching and visual disturbance.  Respiratory:  Negative for apnea, cough, choking, chest tightness, shortness of breath, wheezing and stridor.   Cardiovascular:  Negative for chest pain, palpitations and leg swelling.  Gastrointestinal:  Positive for constipation and diarrhea. Negative for abdominal distention, abdominal pain, anal bleeding, blood in stool, nausea, rectal pain and vomiting.  Endocrine: Negative for cold intolerance, heat intolerance, polydipsia, polyphagia and polyuria.  Genitourinary:  Positive for dysuria, frequency and vaginal discharge. Negative for decreased urine volume, difficulty urinating, dyspareunia, enuresis, flank pain, genital sores,  hematuria, menstrual problem, pelvic pain, urgency, vaginal bleeding and vaginal pain.  Musculoskeletal:  Positive for joint swelling. Negative for arthralgias, back pain, gait problem, myalgias, neck pain and neck stiffness.  Skin:  Negative for color change, pallor, rash and wound.  Allergic/Immunologic: Positive for environmental allergies. Negative for food allergies and immunocompromised state.  Neurological:  Positive for headaches. Negative for dizziness, tremors, seizures, syncope, facial asymmetry, speech difficulty, weakness, light-headedness and numbness.  Hematological:  Negative for adenopathy. Does not bruise/bleed easily.  Psychiatric/Behavioral:  Negative for behavioral problems, confusion, decreased concentration, dysphoric mood, hallucinations, self-injury, sleep disturbance and suicidal ideas. The patient is not nervous/anxious and is not hyperactive.     Menstrual history: Menarche: 11 Period Cycle (Days): 28 Period Duration (Days): 5-7 days Period Pattern: Regular Menstrual Flow: Heavy Menstrual Control: Maxi pad Dysmenorrhea: None Took out nuvaring early for surgery   Past Medical History:  Diagnosis Date   Allergic rhinitis    Encounter for insertion of mirena IUD    GERD (gastroesophageal reflux disease)    Gonorrhea    Headache    Herpes genitalia    High-risk pregnancy 01/17/2014   chorio/mild utering atony   History of hiatal hernia    History of syphilis    Obesity    Sleep apnea    awaitin new one   Syphilis    Past Surgical History:  Procedure Laterality Date   BREAST REDUCTION SURGERY Bilateral 11/02/2019   Procedure: MAMMARY REDUCTION  (BREAST);  Surgeon: Nancy Presume, MD;  Location: [facility];  Service: Plastics;  Laterality: Bilateral;  3 hours   CHOLECYSTECTOMY     10/2020   FRACTURE SURGERY  2004   L left tibia   GASTRIC ROUX-EN-Y N/A 10/30/2021   Procedure: LAPAROSCOPIC ROUX-EN-Y GASTRIC BYPASS WITH UPPER ENDOSCOPY,  conversion  from sleeve;  Surgeon: Nancy Hausen, MD;  Location: [facility];  Service: General;  Laterality: N/A;   LAPAROSCOPIC GASTRIC SLEEVE RESECTION N/A 07/25/2015   Procedure: LAPAROSCOPIC GASTRIC SLEEVE RESECTION;  Surgeon: Nancy Seltzer, MD;  Location: [facility];  Service: General;  Laterality: N/A;   LAPAROSCOPIC GASTRIC SLEEVE RESECTION     Family History  Problem Relation Age of Onset   Hypertension Father    Hypertension Maternal Grandmother    Diabetes Maternal Grandfather    Hypertension Maternal Grandfather    Other Cousin        breast augmentation 2/2 to pain   Social History   Socioeconomic History   Marital status: Married    Spouse name: Insurance claims handler   Number of children: 1   Years of education: college   Highest education level: Not on file  Occupational History   Occupation: AR specialist- Sport and exercise psychologist: [employer]    Comment: [employer]  Tobacco Use   Smoking status: Never   Smokeless tobacco: Never  Vaping Use   Vaping Use: Never used  Substance and Sexual Activity   Alcohol use: Not Currently   Drug use: No   Sexual activity: Yes    Birth control/protection: Inserts    Comment: Nuvaring  Other Topics Concern   Not on file  Social History Narrative   09/09/19   From: [place] - her mom moved her after [place] finished college   Living: with husband Nancy Owen and daughter Nancy Owen   Work: Medical illustrator - Insurance claims handler      Family: mom nearby      Enjoys: reading, spend time with daughter - swimming, dancing      Exercise: not currently, planning to start exercise plan with sister   Diet: intermittent fasting       Safety   Seat belts: Yes    Guns: No   Safe in relationships: Yes    Social Determinants of Radio broadcast assistant Strain: Not on file  Food Insecurity: Not on file  Transportation Needs: Not on file  Physical Activity: Not on file  Stress: Not on file  Social Connections: Not on file  Intimate Partner Violence: Not on  file   Patient denies abnormal paps. History of HSV last outbreak in April after surgery.  Patient denies domestic violence or sexual abuse Patient has received Gardisil series  Health Maintenance  Topic Date Due   TETANUS/TDAP  Never done   COVID-19 Vaccine (3 - Booster for Pfizer series) 01/12/2020   INFLUENZA VACCINE  02/12/2022   PAP SMEAR-Modifier  09/02/2023   Hepatitis C Screening  Completed   HIV Screening  Completed   HPV VACCINES  Aged Out    Medicine list and allergies reviewed and updated.     Objective:  BP 128/80   Ht '5\' 1"'$  (1.549 m)   Wt 224 lb (101.6 kg)   LMP 11/07/2021 (Exact Date)   BMI 42.32 kg/m  Flowsheet Row Nutrition from 03/12/2021 in [place]  PHQ-2 Total Score 0      Physical Exam Vitals and nursing note reviewed. Exam conducted with a chaperone present.  Constitutional:      General: She is not in acute distress.    Appearance: Normal appearance. She is obese. She is not ill-appearing.  HENT:     Head: Normocephalic and atraumatic.     Mouth/Throat:     Mouth: Mucous membranes are moist.     Pharynx: No oropharyngeal exudate.  Eyes:     Extraocular Movements: Extraocular movements intact.  Neck:     Thyroid: No thyromegaly.  Cardiovascular:     Rate and Rhythm: Normal rate and regular rhythm.     Pulses: Normal pulses.     Heart sounds: Normal heart sounds.  Pulmonary:     Effort: Pulmonary effort is normal. No respiratory distress.     Breath sounds: Normal breath sounds.  Chest:     Chest wall: No mass or tenderness.  Breasts:    Breasts are symmetrical.     Right: Normal. No mass, nipple discharge, skin change or tenderness.     Left: Normal. No mass, nipple discharge, skin change or tenderness.  Abdominal:     General: A surgical scar is present. There is no distension.     Palpations: There is no mass.     Tenderness: There is no abdominal tenderness. There is no rebound.     Hernia: No hernia is  present.  Genitourinary:    General: Normal vulva.     Exam position: Lithotomy position.     Pubic Area: No rash.      Labia:        Right: Lesion present. No tenderness.        Left: Lesion present. No rash or tenderness.      Urethra: No urethral lesion.     Vagina: Normal. No foreign body. No vaginal discharge, tenderness or lesions.     Cervix: No discharge, friability, lesion, erythema or cervical bleeding.     Uterus: Normal. Not enlarged, not tender and no uterine prolapse.      Adnexa: Right adnexa normal and left adnexa normal.       Right: No tenderness or fullness.         Left: No tenderness or fullness.       Comments: Vulvar irritation present consistent with probable yeast Musculoskeletal:        General: No tenderness. Normal range of motion.     Cervical back: Normal range of motion. No tenderness.     Right lower leg: Edema present.     Left lower leg: Edema present.  Lymphadenopathy:     Cervical: No cervical adenopathy.     Upper Body:     Right upper body: No axillary adenopathy.     Left upper body: No axillary adenopathy.     Lower Body: No right inguinal adenopathy. No left inguinal adenopathy.  Skin:    General: Skin is warm and dry.  Neurological:     General: No focal deficit present.     Mental Status: She is alert and oriented to person, place, and time. Mental status is at baseline.     Coordination: Coordination normal.     Gait: Gait normal.     Deep Tendon Reflexes: Reflexes normal.  Psychiatric:        Mood and Affect: Mood normal.        Behavior: Behavior normal.        Thought Content: Thought content normal.        Judgment: Judgment normal.    Assessment/Plan:   Encounter for gynecological examination without abnormal finding  Recurrent vaginitis - Plan: NuSwab Vaginitis Plus (VG+), fluconazole (DIFLUCAN) 150 MG tablet  Vaginal itching - Plan: NuSwab Vaginitis Plus (VG+)  Routine screening for STI (sexually transmitted  infection) - Plan: NuSwab Vaginitis Plus (VG+), HIV Antibody (routine testing w rflx), Hepatitis C Antibody, Hepatitis B surface antigen, RPR  History of syphilis - Plan: RPR  S/P gastric surgery  Women's annual routine gynecological examination - Plan: etonogestrel-ethinyl estradiol (NUVARING) 0.12-0.015 MG/24HR vaginal ring  Encounter for surveillance of vaginal ring hormonal contraceptive device - Plan: etonogestrel-ethinyl estradiol (NUVARING) 0.12-0.015 MG/24HR vaginal ring  Chronic blood loss anemia - Plan: Ambulatory referral to Hematology / Oncology  Menorrhagia with regular cycle - Plan: US PELVIS TRANSVAGINAL NON-OB (TV ONLY)  BMI 40.0-44.9, adult (HCC)  HSV-2 (herpes simplex virus 2) infection - Plan: valACYclovir (VALTREX) 500 MG tablet  Pap up to date Cultures obtained. We discussed recurrent vaginitis as she reports > 4 yeast infections a year. Will treat for recurrent vaginitis with Diflucan weekly x 3-6 mo. History of syphilis - recheck RPR titer today with other STI testing  Monthly self breast exam. Mammo age 51 Daily multivitamin recommended. Contraception: nuvaring refilled S/p Gardisil discussed Wellness labs Per PCP  Exercise discussed with patient.  Patient with significant anemia and heavy menses and significant family history of fibroids - pelvic US ordered. Do not see hematology referral or anemia labs - recommend referral to hematology.  Valtrex refill done for history of HSV  Return for GYN [place] and follow up , first available.

## 2021-11-28 NOTE — Patient Instructions (Signed)
Have a great year! Please call with any concerns. ?Don't forget to wear your seatbelt everyday! ?If you are not signed up on MyChart, please ask [place] how to sign up for it!  ? ?In a world where you can be anything, please be kind.  ? ?Body mass index is 42.32 kg/m?Marland Kitchen  ?A Healthy Lifestyle: Care Instructions ?Your Care Instructions ? ?A healthy lifestyle can help you feel good, stay at a healthy weight, and have plenty of energy for both work and play. A healthy lifestyle is something you can share with your whole family. ?A healthy lifestyle also can lower your risk for serious health problems, such as high blood pressure, heart disease, and diabetes. ?You can follow a few steps listed below to improve your health and the health of your family. ?Follow-up care is a key part of your treatment and safety. Be sure to make and go to all appointments, and call your doctor if you are having problems. It's also a good idea to know your test results and keep a list of the medicines you take. ?How can you care for yourself at home? ?Do not eat too much sugar, fat, or fast foods. You can still have dessert and treats now and then. The goal is moderation. ?Start small to improve your eating habits. Pay attention to portion sizes, drink less juice and soda pop, and eat more fruits and vegetables. ?Eat a healthy amount of food. A 3-ounce serving of meat, for example, is about the size of a deck of cards. Fill the rest of your plate with vegetables and whole grains. ?Limit the amount of soda and sports drinks you have every day. Drink more water when you are thirsty. ?Eat at least 5 servings of fruits and vegetables every day. It may seem like a lot, but it is not hard to reach this goal. A serving or helping is 1 piece of fruit, 1 cup of vegetables, or 2 cups of leafy, raw vegetables. Have an apple or some carrot sticks as an afternoon snack instead of a candy bar. Try to have fruits and/or vegetables at every meal. ?Make exercise  part of your daily routine. You may want to start with simple activities, such as walking, bicycling, or slow swimming. Try to be active 30 to 60 minutes every day. You do not need to do all 30 to 60 minutes all at once. For example, you can exercise 3 times a day for 10 or 20 minutes. Moderate exercise is safe for most people, but it is always a good idea to talk to your doctor before starting an exercise program. ?Keep moving. Mow the lawn, work in the garden, or [employer]. Take the stairs instead of the elevator at work. ?If you smoke, quit. People who smoke have an increased risk for heart attack, stroke, cancer, and other lung illnesses. Quitting is hard, but there are ways to boost your chance of quitting tobacco for good. ?Use nicotine gum, patches, or lozenges. ?Ask your doctor about stop-smoking programs and medicines. ?Keep trying. ?In addition to reducing your risk of diseases in the future, you will notice some benefits soon after you stop using tobacco. If you have shortness of breath or asthma symptoms, they will likely get better within a few weeks after you quit. ?Limit how much alcohol you drink. Moderate amounts of alcohol (up to 2 drinks a day for men, 1 drink a day for women) are okay. But drinking too much can lead to  liver problems, high blood pressure, and other health problems. ?Family health ?If you have a family, there are many things you can do together to improve your health. ?Eat meals together as a family as often as possible. ?Eat healthy foods. This includes fruits, vegetables, lean meats and dairy, and whole grains. ?Include your family in your fitness plan. Most people think of activities such as jogging or tennis as the way to fitness, but there are many ways you and your family can be more active. Anything that makes you breathe hard and gets your heart pumping is exercise. Here are some tips: ?Walk to do errands or to take your child to school or the bus. ?Go for a family  bike ride after dinner instead of watching TV. ?Care instructions adapted under license by your healthcare professional. This care instruction is for use with your licensed healthcare professional. If you have questions about a medical condition or this instruction, always ask your healthcare professional. [place] any warranty or liability for your use of this information. ? ?

## 2021-11-29 ENCOUNTER — Telehealth: Payer: Self-pay

## 2021-11-29 NOTE — Telephone Encounter (Signed)
Discussed with patient. She advised she is not having any dysuria. She has frequent urination due to her fluid intake. She does not feel like she has a UTI and does not wish to have her urine tested at this time. She is appreciative of the referral to hematology and would like to proceed with that.

## 2021-11-29 NOTE — Telephone Encounter (Signed)
Noted, referral done.

## 2021-11-29 NOTE — Telephone Encounter (Signed)
-----   Message from April Manson, MD sent at 11/28/2021  5:10 PM EDT ----- Pt reported on ROS dysuria and frequency - she can return for UA reflex if desired as this was not checked today. Also checking labs I do not see hematology referral for her anemia or additional anemia labs - has she had that elsewhere? Recommend referral, placed.

## 2021-11-30 LAB — RPR, QUANT+TP ABS (REFLEX)
Rapid Plasma Reagin, Quant: 1:1 {titer} — ABNORMAL HIGH
T Pallidum Abs: REACTIVE — AB

## 2021-11-30 LAB — RPR: RPR Ser Ql: REACTIVE — AB

## 2021-11-30 LAB — HEPATITIS B SURFACE ANTIGEN: Hepatitis B Surface Ag: NEGATIVE

## 2021-11-30 LAB — HEPATITIS C ANTIBODY: Hep C Virus Ab: NONREACTIVE

## 2021-11-30 LAB — HIV ANTIBODY (ROUTINE TESTING W REFLEX): HIV Screen 4th Generation wRfx: NONREACTIVE

## 2021-12-01 LAB — NUSWAB VAGINITIS PLUS (VG+)
Candida albicans, NAA: POSITIVE — AB
Candida glabrata, NAA: NEGATIVE
Chlamydia trachomatis, NAA: NEGATIVE
Neisseria gonorrhoeae, NAA: NEGATIVE
Trich vag by NAA: NEGATIVE

## 2021-12-05 ENCOUNTER — Encounter: Payer: Self-pay | Admitting: Oncology

## 2021-12-06 ENCOUNTER — Inpatient Hospital Stay: Payer: BC Managed Care – PPO

## 2021-12-06 ENCOUNTER — Encounter: Payer: Self-pay | Admitting: Oncology

## 2021-12-06 ENCOUNTER — Inpatient Hospital Stay: Payer: BC Managed Care – PPO | Attending: Oncology | Admitting: Oncology

## 2021-12-06 VITALS — BP 133/90 | HR 90 | Temp 97.2°F | Ht 61.0 in | Wt 222.0 lb

## 2021-12-06 DIAGNOSIS — K9589 Other complications of other bariatric procedure: Secondary | ICD-10-CM | POA: Insufficient documentation

## 2021-12-06 DIAGNOSIS — D75839 Thrombocytosis, unspecified: Secondary | ICD-10-CM | POA: Diagnosis not present

## 2021-12-06 DIAGNOSIS — Z9884 Bariatric surgery status: Secondary | ICD-10-CM

## 2021-12-06 DIAGNOSIS — R600 Localized edema: Secondary | ICD-10-CM | POA: Diagnosis not present

## 2021-12-06 DIAGNOSIS — R5383 Other fatigue: Secondary | ICD-10-CM | POA: Insufficient documentation

## 2021-12-06 DIAGNOSIS — D500 Iron deficiency anemia secondary to blood loss (chronic): Secondary | ICD-10-CM

## 2021-12-06 LAB — CBC WITH DIFFERENTIAL/PLATELET
Abs Immature Granulocytes: 0.03 10*3/uL (ref 0.00–0.07)
Basophils Absolute: 0.1 10*3/uL (ref 0.0–0.1)
Basophils Relative: 1 %
Eosinophils Absolute: 0.4 10*3/uL (ref 0.0–0.5)
Eosinophils Relative: 4 %
HCT: 30.5 % — ABNORMAL LOW (ref 36.0–46.0)
Hemoglobin: 8.6 g/dL — ABNORMAL LOW (ref 12.0–15.0)
Immature Granulocytes: 0 %
Lymphocytes Relative: 27 %
Lymphs Abs: 2.8 10*3/uL (ref 0.7–4.0)
MCH: 20.1 pg — ABNORMAL LOW (ref 26.0–34.0)
MCHC: 28.2 g/dL — ABNORMAL LOW (ref 30.0–36.0)
MCV: 71.3 fL — ABNORMAL LOW (ref 80.0–100.0)
Monocytes Absolute: 0.6 10*3/uL (ref 0.1–1.0)
Monocytes Relative: 6 %
Neutro Abs: 6.4 10*3/uL (ref 1.7–7.7)
Neutrophils Relative %: 62 %
Platelets: 401 10*3/uL — ABNORMAL HIGH (ref 150–400)
RBC: 4.28 MIL/uL (ref 3.87–5.11)
RDW: 21.2 % — ABNORMAL HIGH (ref 11.5–15.5)
WBC: 10.3 10*3/uL (ref 4.0–10.5)
nRBC: 0 % (ref 0.0–0.2)

## 2021-12-06 LAB — SAMPLE TO BLOOD BANK

## 2021-12-06 LAB — VITAMIN B12: Vitamin B-12: 597 pg/mL (ref 180–914)

## 2021-12-06 NOTE — Progress Notes (Signed)
Hematology/Oncology Consult note [phone] [phone]         Patient Care Team: [name], MD as PCP - General (Family Medicine)  REFERRING PROVIDER: April Manson, MD  CHIEF COMPLAINTS/REASON FOR VISIT:  Evaluation of anemia  HISTORY OF PRESENTING ILLNESS:   Nancy Owen is a  36 y.o.  female with PMH listed below was seen in consultation at the request of  April Manson, MD  for evaluation of anemia  10/30/2021, status post gastric bypass by Dr. Hassell Done At [facility]. 11/01/2021, hemoglobin 7.6.  MCV 70.8,WBC 13.4, platelet count 365. 11/21/2021, patient had iron panel done which showed Iron saturation 4, folate 11.5  Patient was referred to establish care for management of anemia. +fatigue.  Denies nausea vomiting abdominal pain, Denies black or bloody stool.  Review of Systems  Constitutional:  Positive for fatigue. Negative for appetite change, chills and fever.  HENT:   Negative for hearing loss and voice change.   Eyes:  Negative for eye problems.  Respiratory:  Negative for chest tightness and cough.   Cardiovascular:  Negative for chest pain.  Gastrointestinal:  Negative for abdominal distention, abdominal pain and blood in stool.  Endocrine: Negative for hot flashes.  Genitourinary:  Negative for difficulty urinating and frequency.   Musculoskeletal:  Negative for arthralgias.  Skin:  Negative for itching and rash.  Neurological:  Negative for extremity weakness.  Hematological:  Negative for adenopathy.  Psychiatric/Behavioral:  Negative for confusion.    MEDICAL HISTORY:  Past Medical History:  Diagnosis Date   Allergic rhinitis    Encounter for insertion of mirena IUD    GERD (gastroesophageal reflux disease)    Gonorrhea    Headache    Herpes genitalia    High-risk pregnancy 01/17/2014   chorio/mild utering atony   History of hiatal hernia    History of syphilis    Obesity    Sleep apnea    awaitin new one    Syphilis     SURGICAL HISTORY: Past Surgical History:  Procedure Laterality Date   BREAST REDUCTION SURGERY Bilateral 11/02/2019   Procedure: MAMMARY REDUCTION  (BREAST);  Surgeon: Cindra Presume, MD;  Location: [place];  Service: Plastics;  Laterality: Bilateral;  3 hours   CHOLECYSTECTOMY     10/2020   FRACTURE SURGERY  2004   L left tibia   GASTRIC ROUX-EN-Y N/A 10/30/2021   Procedure: LAPAROSCOPIC ROUX-EN-Y GASTRIC BYPASS WITH UPPER ENDOSCOPY, conversion from sleeve;  Surgeon: Johnathan Hausen, MD;  Location: [facility];  Service: General;  Laterality: N/A;   LAPAROSCOPIC GASTRIC SLEEVE RESECTION N/A 07/25/2015   Procedure: LAPAROSCOPIC GASTRIC SLEEVE RESECTION;  Surgeon: Excell Seltzer, MD;  Location: [facility];  Service: General;  Laterality: N/A;   LAPAROSCOPIC GASTRIC SLEEVE RESECTION      SOCIAL HISTORY: Social History   Socioeconomic History   Marital status: Married    Spouse name: Insurance claims handler   Number of children: 1   Years of education: college   Highest education level: Not on file  Occupational History   Occupation: AR specialist- Sport and exercise psychologist: [employer]    Comment: [employer]  Tobacco Use   Smoking status: Never   Smokeless tobacco: Never  Vaping Use   Vaping Use: Never used  Substance and Sexual Activity   Alcohol use: Not Currently   Drug use: No   Sexual activity: Yes    Birth control/protection: Inserts    Comment: Nuvaring  Other Topics Concern  Not on file  Social History Narrative   09/09/19   From: [place] - her mom moved her after [employer] finished college   Living: with husband [name] and daughter [name]   Work: Medical illustrator - Insurance claims handler      Family: mom nearby      Enjoys: reading, spend time with daughter - swimming, dancing      Exercise: not currently, planning to start exercise plan with sister   Diet: intermittent fasting       Safety   Seat belts: Yes    Guns: No   Safe in relationships: Yes     Social Determinants of Radio broadcast assistant Strain: Not on file  Food Insecurity: Not on file  Transportation Needs: Not on file  Physical Activity: Not on file  Stress: Not on file  Social Connections: Not on file  Intimate Partner Violence: Not on file    FAMILY HISTORY: Family History  Problem Relation Age of Onset   Hypertension Father    Hypertension Maternal Grandmother    Diabetes Maternal Grandfather    Hypertension Maternal Grandfather    Other Cousin        breast augmentation 2/2 to pain    ALLERGIES:  has No Known Allergies.  MEDICATIONS:  Current Outpatient Medications  Medication Sig Dispense Refill   acetaminophen (TYLENOL) 500 MG tablet Take 500 mg by mouth every 6 (six) hours as needed for mild pain.     etonogestrel-ethinyl estradiol (NUVARING) 0.12-0.015 MG/24HR vaginal ring Insert vaginally and leave in place for 3 consecutive weeks, then remove for 1 week. 3 each 3   fluconazole (DIFLUCAN) 150 MG tablet One pill every 3 days x 3 days then one pill weekly x 6 months 27 tablet 0   loratadine (CLARITIN) 10 MG tablet Take 10 mg by mouth daily as needed for allergies.     pantoprazole (PROTONIX) 40 MG tablet Take by mouth.     valACYclovir (VALTREX) 500 MG tablet Take 1 tablet (500 mg total) by mouth daily as needed (outbreak). 30 tablet 0   phentermine (ADIPEX-P) 37.5 MG tablet Take 1 tablet (37.5 mg total) by mouth daily before breakfast. (Patient not taking: Reported on 12/05/2021) 30 tablet 0   No current facility-administered medications for this visit.     PHYSICAL EXAMINATION:  Vitals:   12/05/21 1502  BP: 133/90  Pulse: 90  Temp: (!) 97.2 F (36.2 C)   Filed Weights   12/05/21 1502  Weight: 222 lb (100.7 kg)    Physical Exam Constitutional:      General: She is not in acute distress. HENT:     Head: Normocephalic and atraumatic.  Eyes:     General: No scleral icterus. Cardiovascular:     Rate and Rhythm: Normal rate and  regular rhythm.     Heart sounds: Normal heart sounds.  Pulmonary:     Effort: Pulmonary effort is normal. No respiratory distress.     Breath sounds: No wheezing.  Abdominal:     General: Bowel sounds are normal. There is no distension.     Palpations: Abdomen is soft.  Musculoskeletal:        General: Normal range of motion.     Cervical back: Normal range of motion and neck supple.     Comments: Trace bilateral lower extremity edema.  Skin:    General: Skin is warm and dry.     Findings: No erythema or rash.  Neurological:  Mental Status: She is alert and oriented to person, place, and time. Mental status is at baseline.     Cranial Nerves: No cranial nerve deficit.     Coordination: Coordination normal.  Psychiatric:        Mood and Affect: Mood normal.    LABORATORY DATA:  I have reviewed the data as listed Lab Results  Component Value Date   WBC 10.3 12/06/2021   HGB 8.6 (L) 12/06/2021   HCT 30.5 (L) 12/06/2021   MCV 71.3 (L) 12/06/2021   PLT 401 (H) 12/06/2021   Recent Labs    01/05/21 0251 10/08/21 1330 10/30/21 1402  NA 135 139  --   K 3.8 3.4*  --   CL 106 110  --   CO2 24 22  --   GLUCOSE 126* 83  --   BUN 12 9  --   CREATININE 0.65 0.62 0.69  CALCIUM 9.0 8.5*  --   GFRNONAA >60 >60 >60  PROT 8.4* 7.8  --   ALBUMIN 3.7 3.5  --   AST 70* 12*  --   ALT 47* 18  --   ALKPHOS 110 88  --   BILITOT 0.9 0.2*  --    Iron/TIBC/Ferritin/ %Sat No results found for: IRON, TIBC, FERRITIN, IRONPCTSAT    RADIOGRAPHIC STUDIES: I have personally reviewed the radiological images as listed and agreed with the findings in the report. No results found.    ASSESSMENT & PLAN:  1. S/P gastric bypass   2. Iron deficiency anemia due to chronic blood loss   3. Thrombocytosis    # Iron deficiency anemia, in the setting of gastric bypass. We will check CBC, iron, TIBC ferritin, folate, B12, Patient is interested in IV Venofer treatments. Plan IV iron with  Venofer '200mg'$  weekly x 4 doses. Risks of infusion reactions including anaphylactic reactions were discussed with patient. Other side effects include but not limited to high blood pressure, headache,wheezing, SOB, skin rash and itchiness, leg swelling, etc. Patient voices understanding and is willing to proceed.   Thrombocytosis, likely secondary to iron deficiency and recent surgery.  Monitor.  Orders Placed This Encounter  Procedures   Ferritin    Standing Status:   Future    Number of Occurrences:   1    Standing Expiration Date:   06/08/2022   CBC with Differential/Platelet    Standing Status:   Future    Number of Occurrences:   1    Standing Expiration Date:   12/07/2022   Iron and TIBC    Standing Status:   Future    Number of Occurrences:   1    Standing Expiration Date:   12/07/2022   Technologist smear review    Standing Status:   Future    Standing Expiration Date:   12/07/2022   Folate    Standing Status:   Future    Number of Occurrences:   1    Standing Expiration Date:   12/07/2022   Vitamin B12    Standing Status:   Future    Number of Occurrences:   1    Standing Expiration Date:   12/07/2022   Hold Tube- Blood Bank    Standing Status:   Future    Number of Occurrences:   1    Standing Expiration Date:   12/07/2022    All questions were answered. The patient knows to call the clinic with any problems questions or concerns.   April Manson,  MD   IV Venofer weekly x4.  Follow-up in 3 months.  Earlie Server, MD, PhD [facility] Hematology Oncology 12/06/2021

## 2021-12-07 LAB — FOLATE: Folate: 22 ng/mL (ref >5.9)

## 2021-12-07 LAB — FERRITIN: Ferritin: 5 ng/mL — ABNORMAL LOW (ref 11–307)

## 2021-12-07 LAB — IRON AND TIBC
Iron: 20 ug/dL — ABNORMAL LOW (ref 28–170)
Saturation Ratios: 4 % — ABNORMAL LOW (ref 10.4–31.8)
TIBC: 468 ug/dL — ABNORMAL HIGH (ref 250–450)
UIBC: 448 ug/dL

## 2021-12-13 ENCOUNTER — Ambulatory Visit (INDEPENDENT_AMBULATORY_CARE_PROVIDER_SITE_OTHER): Payer: BC Managed Care – PPO

## 2021-12-13 DIAGNOSIS — N939 Abnormal uterine and vaginal bleeding, unspecified: Secondary | ICD-10-CM

## 2021-12-14 ENCOUNTER — Inpatient Hospital Stay: Payer: BC Managed Care – PPO

## 2021-12-14 ENCOUNTER — Other Ambulatory Visit: Payer: Self-pay

## 2021-12-14 ENCOUNTER — Inpatient Hospital Stay: Payer: BC Managed Care – PPO | Attending: Oncology

## 2021-12-14 VITALS — BP 118/80 | HR 88 | Temp 97.4°F | Resp 18

## 2021-12-14 DIAGNOSIS — D500 Iron deficiency anemia secondary to blood loss (chronic): Secondary | ICD-10-CM | POA: Insufficient documentation

## 2021-12-14 DIAGNOSIS — Z9884 Bariatric surgery status: Secondary | ICD-10-CM

## 2021-12-14 LAB — PREGNANCY, URINE: Preg Test, Ur: NEGATIVE

## 2021-12-14 MED ORDER — SODIUM CHLORIDE 0.9 % IV SOLN
Freq: Once | INTRAVENOUS | Status: AC
  Filled 2021-12-14: qty 250

## 2021-12-14 MED ORDER — SODIUM CHLORIDE 0.9 % IV SOLN: 200.0000 mg | Freq: Once | INTRAVENOUS | Status: DC

## 2021-12-14 MED ORDER — IRON SUCROSE 20 MG/ML IV SOLN
200.0000 mg | Freq: Once | INTRAVENOUS | Status: AC
  Administered 2021-12-14: 200 mg via INTRAVENOUS
  Filled 2021-12-14: qty 10

## 2021-12-21 ENCOUNTER — Inpatient Hospital Stay: Payer: BC Managed Care – PPO

## 2021-12-21 ENCOUNTER — Other Ambulatory Visit: Payer: Self-pay

## 2021-12-21 VITALS — BP 108/72 | HR 85 | Temp 97.6°F | Resp 17

## 2021-12-21 DIAGNOSIS — B009 Herpesviral infection, unspecified: Secondary | ICD-10-CM

## 2021-12-21 DIAGNOSIS — D500 Iron deficiency anemia secondary to blood loss (chronic): Secondary | ICD-10-CM

## 2021-12-21 LAB — PREGNANCY, URINE: Preg Test, Ur: NEGATIVE

## 2021-12-21 MED ORDER — VALACYCLOVIR HCL 500 MG PO TABS: 500.0000 mg | ORAL_TABLET | Freq: Every day | ORAL | 5 refills | Status: DC | PRN

## 2021-12-21 MED ORDER — SODIUM CHLORIDE 0.9 % IV SOLN
Freq: Once | INTRAVENOUS | Status: AC
  Filled 2021-12-21: qty 250

## 2021-12-21 MED ORDER — IRON SUCROSE 20 MG/ML IV SOLN
200.0000 mg | Freq: Once | INTRAVENOUS | Status: AC
  Administered 2021-12-21: 200 mg via INTRAVENOUS
  Filled 2021-12-21: qty 10

## 2021-12-21 MED ORDER — SODIUM CHLORIDE 0.9 % IV SOLN: 200.0000 mg | Freq: Once | INTRAVENOUS | Status: DC

## 2021-12-21 NOTE — Telephone Encounter (Signed)
Reviewed chart. Patient seen by Dr. Sharlet Salina 11/28/21. She was given a one month refill of Valtrex. She is on a maintenance dose (daily). Ok to send in 6 months refills per Roberto Scales, CNM.

## 2021-12-21 NOTE — Progress Notes (Signed)
Patient tolerated Venofer infusion well, no questions/concerns voiced. Patient stable at discharge. AVS given.   ?

## 2021-12-21 NOTE — Telephone Encounter (Signed)
Fax Refill Request received for Valtrex '500mg'$  #30. Last filled 11/28/21. Patient expects to pick-up: 12/25/21 '@2'$ :25pm.

## 2021-12-21 NOTE — Patient Instructions (Signed)

## 2021-12-25 ENCOUNTER — Encounter: Payer: Self-pay | Admitting: Skilled Nursing Facility1

## 2021-12-25 ENCOUNTER — Encounter: Payer: BC Managed Care – PPO | Attending: Family Medicine | Admitting: Skilled Nursing Facility1

## 2021-12-25 DIAGNOSIS — E669 Obesity, unspecified: Secondary | ICD-10-CM | POA: Diagnosis present

## 2021-12-25 NOTE — Progress Notes (Signed)
Bariatric Nutrition Follow-Up Visit Medical Nutrition Therapy    NUTRITION ASSESSMENT    Surgery date: 10/30/2021 Surgery type: RYGB Start weight at [facility]: 239.2 Weight today: 227.3   Body Composition Scale 11/13/2021 12/25/2021  Current Body Weight 227.3 219.8  Total Body Fat % 44.0 43.2  Visceral Fat 13 13  Fat-Free Mass % 55.9 56.7   Total Body Water % 42.4 42.8  Muscle-Mass lbs 30.3 30.2  BMI 43.0 41.6  Body Fat Displacement           Torso  lbs 62.0 58.8         Left Leg  lbs 12.4 11.7         Right Leg  lbs 12.4 11.7         Left Arm  lbs 6.2 5.8         Right Arm   lbs 6.2 5.8   Clinical: Medical History: sleep apnea, cholelithiasis, insomnia, GERD Medications: see list Relevant labs: Notable symptoms: GE reflux symptoms Drug allergies: none known Food allergies: none known   Lifestyle & Dietary Hx  Pt state she has heavy periods needing iron infusions and is working with her GYN upcoming.  Pt states her energy is starting to pick up a bit more since the infusions.  Pt states she is still taking the protonix.  Pt states her daughter and husband are trying to be healthy too. Pt states they are going to [place] in July.  Pt states she usually steams or boils her vegetables adding balsamic or dash for flavor.   Pt states her surgeon will not clear her for activity until 3 months from now.   Estimated daily fluid intake: 40 oz Estimated daily protein intake: 60 g Supplements: multi with '45mg'$ , calcium Current average weekly physical activity: walking 6 days a week 30 minutes   24-Hr Dietary Recall First Meal 8am: scrambled egg whites + [place] sausage Snack:  cheese Second Meal: protein shake Snack:   Third Meal: baked fish + balsalmic or shrimp Snack:  Beverages: 50/50 orange juice, water  Post-Op Goals/ Signs/ Symptoms Using straws: no Drinking while eating: no Chewing/swallowing difficulties: no Changes in vision: no Changes to mood/headaches:  no Hair loss/changes to skin/nails: no Difficulty focusing/concentrating: no Sweating: no Limb weakness: no Dizziness/lightheadedness: no Palpitations: no  Carbonated/caffeinated beverages: no N/V/D/C/Gas: no Abdominal pain: no Dumping syndrome: no    NUTRITION DIAGNOSIS  Overweight/obesity ([place]-3.3) related to past poor dietary habits and physical inactivity as evidenced by completed bariatric surgery and following dietary guidelines for continued weight loss and healthy nutrition status.     NUTRITION INTERVENTION Nutrition counseling (C-1) and education (E-2) to facilitate bariatric surgery goals, including: Diet advancement to the next phase (phase 4) now including non starchy vegetables The importance of consuming adequate calories as well as certain nutrients daily due to the body's need for essential vitamins, minerals, and fats The importance of daily physical activity and to reach a goal of at least 150 minutes of moderate to vigorous physical activity weekly (or as directed by their physician) due to benefits such as increased musculature and improved lab values The importance of intuitive eating specifically learning hunger-satiety cues and understanding the importance of learning a new body: The importance of mindful eating to avoid grazing behaviors   Goals: -Continue to aim for a minimum of 64 fluid ounces 7 days a week with at least 30 ounces being plain water  -Eat non-starchy vegetables 2 times a day 7 days a week  -  Start out with soft cooked vegetables today and tomorrow; if tolerated begin to eat raw vegetables or cooked including salads  -Eat your 3 ounces of protein first then start in on your non-starchy vegetables; once you understand how much of your meal leads to satisfaction and not full while still eating 3 ounces of protein and non-starchy vegetables you can eat them in any order   -Continue to aim for 30 minutes of activity at least 5 times a week  -Do  NOT cook with/add to your food: alfredo sauce, cheese sauce, barbeque sauce, ketchup, fat back, butter, bacon grease, grease, Crisco, OR SUGAR   Handouts Provided Include  Phase 4  Learning Style & Readiness for Change Teaching method utilized: Visual & Auditory  Demonstrated degree of understanding via: Teach Back  Readiness Level: action Barriers to learning/adherence to lifestyle change: none identified   RD's Notes for Next Visit Assess adherence to pt chosen goals    MONITORING & EVALUATION Dietary intake, weekly physical activity, body weight  Next Steps Patient is to follow-up in September

## 2021-12-28 ENCOUNTER — Inpatient Hospital Stay: Payer: BC Managed Care – PPO

## 2021-12-28 ENCOUNTER — Other Ambulatory Visit: Payer: Self-pay

## 2021-12-28 VITALS — BP 122/73 | HR 72 | Temp 99.6°F | Resp 18

## 2021-12-28 DIAGNOSIS — D500 Iron deficiency anemia secondary to blood loss (chronic): Secondary | ICD-10-CM

## 2021-12-28 LAB — PREGNANCY, URINE: Preg Test, Ur: NEGATIVE

## 2021-12-28 MED ORDER — SODIUM CHLORIDE 0.9 % IV SOLN
Freq: Once | INTRAVENOUS | Status: AC
  Filled 2021-12-28: qty 250

## 2021-12-28 MED ORDER — SODIUM CHLORIDE 0.9 % IV SOLN: 200.0000 mg | Freq: Once | INTRAVENOUS | Status: DC

## 2021-12-28 MED ORDER — IRON SUCROSE 20 MG/ML IV SOLN
200.0000 mg | Freq: Once | INTRAVENOUS | Status: AC
  Administered 2021-12-28: 200 mg via INTRAVENOUS
  Filled 2021-12-28: qty 10

## 2022-01-04 ENCOUNTER — Inpatient Hospital Stay: Payer: BC Managed Care – PPO

## 2022-01-04 VITALS — BP 119/68 | HR 75 | Temp 98.6°F | Resp 18

## 2022-01-04 DIAGNOSIS — D500 Iron deficiency anemia secondary to blood loss (chronic): Secondary | ICD-10-CM | POA: Diagnosis not present

## 2022-01-04 LAB — PREGNANCY, URINE: Preg Test, Ur: NEGATIVE

## 2022-01-04 MED ORDER — IRON SUCROSE 20 MG/ML IV SOLN
200.0000 mg | Freq: Once | INTRAVENOUS | Status: AC
  Administered 2022-01-04: 200 mg via INTRAVENOUS
  Filled 2022-01-04: qty 10

## 2022-01-04 MED ORDER — SODIUM CHLORIDE 0.9 % IV SOLN: 200.0000 mg | Freq: Once | INTRAVENOUS | Status: DC

## 2022-01-04 MED ORDER — SODIUM CHLORIDE 0.9 % IV SOLN
Freq: Once | INTRAVENOUS | Status: AC
  Filled 2022-01-04: qty 250

## 2022-01-11 ENCOUNTER — Ambulatory Visit: Payer: BC Managed Care – PPO | Admitting: Family Medicine

## 2022-03-01 ENCOUNTER — Other Ambulatory Visit: Payer: Self-pay

## 2022-03-01 DIAGNOSIS — D500 Iron deficiency anemia secondary to blood loss (chronic): Secondary | ICD-10-CM

## 2022-03-04 ENCOUNTER — Telehealth: Payer: Self-pay | Admitting: Oncology

## 2022-03-04 ENCOUNTER — Inpatient Hospital Stay: Payer: BC Managed Care – PPO | Attending: Oncology

## 2022-03-04 DIAGNOSIS — D500 Iron deficiency anemia secondary to blood loss (chronic): Secondary | ICD-10-CM | POA: Insufficient documentation

## 2022-03-04 DIAGNOSIS — Z9884 Bariatric surgery status: Secondary | ICD-10-CM | POA: Diagnosis present

## 2022-03-04 LAB — CBC WITH DIFFERENTIAL/PLATELET
Abs Immature Granulocytes: 0.03 10*3/uL (ref 0.00–0.07)
Basophils Absolute: 0.1 10*3/uL (ref 0.0–0.1)
Basophils Relative: 0 %
Eosinophils Absolute: 0.2 10*3/uL (ref 0.0–0.5)
Eosinophils Relative: 2 %
HCT: 37.7 % (ref 36.0–46.0)
Hemoglobin: 12 g/dL (ref 12.0–15.0)
Immature Granulocytes: 0 %
Lymphocytes Relative: 24 %
Lymphs Abs: 2.7 10*3/uL (ref 0.7–4.0)
MCH: 26.9 pg (ref 26.0–34.0)
MCHC: 31.8 g/dL (ref 30.0–36.0)
MCV: 84.5 fL (ref 80.0–100.0)
Monocytes Absolute: 0.6 10*3/uL (ref 0.1–1.0)
Monocytes Relative: 5 %
Neutro Abs: 7.7 10*3/uL (ref 1.7–7.7)
Neutrophils Relative %: 69 %
Platelets: 291 10*3/uL (ref 150–400)
RBC: 4.46 MIL/uL (ref 3.87–5.11)
RDW: 18.3 % — ABNORMAL HIGH (ref 11.5–15.5)
WBC: 11.3 10*3/uL — ABNORMAL HIGH (ref 4.0–10.5)
nRBC: 0 % (ref 0.0–0.2)

## 2022-03-04 LAB — IRON AND TIBC
Iron: 67 ug/dL (ref 28–170)
Saturation Ratios: 15 % (ref 10.4–31.8)
TIBC: 435 ug/dL (ref 250–450)
UIBC: 368 ug/dL

## 2022-03-04 LAB — FERRITIN: Ferritin: 26 ng/mL (ref 11–307)

## 2022-03-04 LAB — PREGNANCY, URINE: Preg Test, Ur: NEGATIVE

## 2022-03-04 NOTE — Telephone Encounter (Signed)
pt requested to have appts moved to later in the day every visit, Infomred pt of last est slot avail. Pt req to have MD visit done via telephone. Please advise.

## 2022-03-04 NOTE — Telephone Encounter (Signed)
spoke with pt, Dr, Tasia Catchings will see pt at 3pm 8/24.Marland KitchenKJ

## 2022-03-06 NOTE — Telephone Encounter (Signed)
Nancy Owen, pt already scheduled for tomorrow at 3. Ok to keep appt as is. No changes.

## 2022-03-07 ENCOUNTER — Inpatient Hospital Stay: Payer: BC Managed Care – PPO

## 2022-03-07 ENCOUNTER — Encounter: Payer: Self-pay | Admitting: Oncology

## 2022-03-07 ENCOUNTER — Inpatient Hospital Stay: Payer: BC Managed Care – PPO | Admitting: Oncology

## 2022-03-07 ENCOUNTER — Inpatient Hospital Stay (HOSPITAL_BASED_OUTPATIENT_CLINIC_OR_DEPARTMENT_OTHER): Payer: BC Managed Care – PPO | Admitting: Oncology

## 2022-03-07 VITALS — BP 118/72 | HR 85 | Temp 98.3°F | Resp 18

## 2022-03-07 VITALS — BP 115/74 | HR 88 | Temp 98.7°F | Resp 20 | Wt 212.2 lb

## 2022-03-07 DIAGNOSIS — Z9884 Bariatric surgery status: Secondary | ICD-10-CM

## 2022-03-07 DIAGNOSIS — D500 Iron deficiency anemia secondary to blood loss (chronic): Secondary | ICD-10-CM | POA: Diagnosis not present

## 2022-03-07 MED ORDER — SODIUM CHLORIDE 0.9 % IV SOLN
200.0000 mg | Freq: Once | INTRAVENOUS | Status: AC
  Administered 2022-03-07: 200 mg via INTRAVENOUS
  Filled 2022-03-07: qty 200

## 2022-03-07 MED ORDER — SODIUM CHLORIDE 0.9 % IV SOLN
Freq: Once | INTRAVENOUS | Status: AC
  Filled 2022-03-07: qty 250

## 2022-03-09 ENCOUNTER — Encounter: Payer: Self-pay | Admitting: Oncology

## 2022-03-09 NOTE — Progress Notes (Signed)
Hematology/Oncology Progress note [phone] [phone]            Patient Care Team: [name], MD as PCP - General (Family Medicine)  REFERRING PROVIDER: [name], MD  CHIEF COMPLAINTS/REASON FOR VISIT:  Iron deficiency anemia and gastric bypass  HISTORY OF PRESENTING ILLNESS:   Nancy Owen is a  36 y.o.  female with PMH listed below was seen in consultation at the request of  [name], MD  for evaluation of anemia  10/30/2021, status post gastric bypass by Dr. Hassell Done At [facility]. 11/01/2021, hemoglobin 7.6.  MCV 70.8,WBC 13.4, platelet count 365. 11/21/2021, patient had iron panel done which showed Iron saturation 4, folate 11.5  Patient was referred to establish care for management of anemia. +fatigue.  Denies nausea vomiting abdominal pain, Denies black or bloody stool.  INTERVAL HISTORY Nancy Owen is a 36 y.o. female who has above history reviewed by me today presents for follow up visit for management of IDA Problems and complaints are listed below: She tolerates venofer treatments. Fatigue, chronic, some improvement  Review of Systems  Constitutional:  Positive for fatigue. Negative for appetite change, chills and fever.  HENT:   Negative for hearing loss and voice change.   Eyes:  Negative for eye problems.  Respiratory:  Negative for chest tightness and cough.   Cardiovascular:  Negative for chest pain.  Gastrointestinal:  Negative for abdominal distention, abdominal pain and blood in stool.  Endocrine: Negative for hot flashes.  Genitourinary:  Negative for difficulty urinating and frequency.   Musculoskeletal:  Negative for arthralgias.  Skin:  Negative for itching and rash.  Neurological:  Negative for extremity weakness.  Hematological:  Negative for adenopathy.  Psychiatric/Behavioral:  Negative for confusion.     MEDICAL HISTORY:  Past Medical History:  Diagnosis Date   Allergic rhinitis    Encounter  for insertion of mirena IUD    GERD (gastroesophageal reflux disease)    Gonorrhea    Headache    Herpes genitalia    High-risk pregnancy 01/17/2014   chorio/mild utering atony   History of hiatal hernia    History of syphilis    Obesity    Sleep apnea    awaitin new one   Syphilis     SURGICAL HISTORY: Past Surgical History:  Procedure Laterality Date   BREAST REDUCTION SURGERY Bilateral 11/02/2019   Procedure: MAMMARY REDUCTION  (BREAST);  Surgeon: Cindra Presume, MD;  Location: [facility];  Service: Plastics;  Laterality: Bilateral;  3 hours   CHOLECYSTECTOMY     10/2020   FRACTURE SURGERY  2004   L left tibia   GASTRIC ROUX-EN-Y N/A 10/30/2021   Procedure: LAPAROSCOPIC ROUX-EN-Y GASTRIC BYPASS WITH UPPER ENDOSCOPY, conversion from sleeve;  Surgeon: Johnathan Hausen, MD;  [facility];  Service: General;  Laterality: N/A;   LAPAROSCOPIC GASTRIC SLEEVE RESECTION N/A 07/25/2015   Procedure: LAPAROSCOPIC GASTRIC SLEEVE RESECTION;  Surgeon: Excell Seltzer, MD;  [facility];  Service: General;  Laterality: N/A;   LAPAROSCOPIC GASTRIC SLEEVE RESECTION      SOCIAL HISTORY: Social History   Socioeconomic History   Marital status: Married    Spouse name: Kevon   Number of children: 1   Years of education: college   Highest education level: Not on file  Occupational History   Occupation: AR specialist- Sport and exercise psychologist: [employer]    Comment: [employer]  Tobacco Use   Smoking status: Never  Smokeless tobacco: Never  Vaping Use   Vaping Use: Never used  Substance and Sexual Activity   Alcohol use: Not Currently   Drug use: No   Sexual activity: Yes    Birth control/protection: Inserts    Comment: Nuvaring  Other Topics Concern   Not on file  Social History Narrative   09/09/19   From: [place] - her mom moved her after [place] finished college   Living: with husband [name] and daughter [name]   Work: Medical illustrator - Insurance claims handler       Family: mom nearby      Enjoys: reading, spend time with daughter - swimming, dancing      Exercise: not currently, planning to start exercise plan with sister   Diet: intermittent fasting       Safety   Seat belts: Yes    Guns: No   Safe in relationships: Yes    Social Determinants of Radio broadcast assistant Strain: Not on file  Food Insecurity: Not on file  Transportation Needs: Not on file  Physical Activity: Not on file  Stress: Not on file  Social Connections: Not on file  Intimate Partner Violence: Not on file    FAMILY HISTORY: Family History  Problem Relation Age of Onset   Hypertension Father    Hypertension Maternal Grandmother    Diabetes Maternal Grandfather    Hypertension Maternal Grandfather    Other Cousin        breast augmentation 2/2 to pain    ALLERGIES:  has No Known Allergies.  MEDICATIONS:  Current Outpatient Medications  Medication Sig Dispense Refill   acetaminophen (TYLENOL) 500 MG tablet Take 500 mg by mouth every 6 (six) hours as needed for mild pain.     etonogestrel-ethinyl estradiol (NUVARING) 0.12-0.015 MG/24HR vaginal ring Insert vaginally and leave in place for 3 consecutive weeks, then remove for 1 week. 3 each 3   fluconazole (DIFLUCAN) 150 MG tablet One pill every 3 days x 3 days then one pill weekly x 6 months 27 tablet 0   loratadine (CLARITIN) 10 MG tablet Take 10 mg by mouth daily as needed for allergies.     valACYclovir (VALTREX) 500 MG tablet Take 1 tablet (500 mg total) by mouth daily as needed (outbreak). 30 tablet 5   pantoprazole (PROTONIX) 40 MG tablet Take by mouth. (Patient not taking: Reported on 03/07/2022)     phentermine (ADIPEX-P) 37.5 MG tablet Take 1 tablet (37.5 mg total) by mouth daily before breakfast. (Patient not taking: Reported on 12/05/2021) 30 tablet 0   No current facility-administered medications for this visit.     PHYSICAL EXAMINATION:  Vitals:   03/07/22 1505  BP: 115/74  Pulse: 88   Resp: 20  Temp: 98.7 F (37.1 C)  SpO2: 100%   Filed Weights   03/07/22 1505  Weight: 212 lb 3.2 oz (96.3 kg)    Physical Exam Constitutional:      General: She is not in acute distress. HENT:     Head: Normocephalic and atraumatic.  Eyes:     General: No scleral icterus. Cardiovascular:     Rate and Rhythm: Normal rate and regular rhythm.     Heart sounds: Normal heart sounds.  Pulmonary:     Effort: Pulmonary effort is normal. No respiratory distress.     Breath sounds: No wheezing.  Abdominal:     General: Bowel sounds are normal. There is no distension.     Palpations: Abdomen is soft.  Musculoskeletal:        General: Normal range of motion.     Cervical back: Normal range of motion and neck supple.  Skin:    General: Skin is warm and dry.     Findings: No erythema or rash.  Neurological:     Mental Status: She is alert and oriented to person, place, and time. Mental status is at baseline.     Cranial Nerves: No cranial nerve deficit.     Coordination: Coordination normal.  Psychiatric:        Mood and Affect: Mood normal.     LABORATORY DATA:  I have reviewed the data as listed Lab Results  Component Value Date   WBC 11.3 (H) 03/04/2022   HGB 12.0 03/04/2022   HCT 37.7 03/04/2022   MCV 84.5 03/04/2022   PLT 291 03/04/2022   Recent Labs    10/08/21 1330 10/30/21 1402  NA 139  --   K 3.4*  --   CL 110  --   CO2 22  --   GLUCOSE 83  --   BUN 9  --   CREATININE 0.62 0.69  CALCIUM 8.5*  --   GFRNONAA >60 >60  PROT 7.8  --   ALBUMIN 3.5  --   AST 12*  --   ALT 18  --   ALKPHOS 88  --   BILITOT 0.2*  --     Iron/TIBC/Ferritin/ %Sat    Component Value Date/Time   IRON 67 03/04/2022 1103   TIBC 435 03/04/2022 1103   FERRITIN 26 03/04/2022 1103   IRONPCTSAT 15 03/04/2022 1103      RADIOGRAPHIC STUDIES: I have personally reviewed the radiological images as listed and agreed with the findings in the report. No results  found.    ASSESSMENT & PLAN:  1. Iron deficiency anemia due to chronic blood loss   2. S/P gastric bypass    # Iron deficiency anemia, in the setting of gastric bypass. Labs are reviewed and discussed with patient. Both hemoglobin and iron panel have improved.  In the setting of gastric bypass, I recommend maintenance dose of Venofer '200mg'$  x 1 today.  Follow up in 6 months.   Thrombocytosis resolves.   Gastric bypass, monitor iron panel, B12, folate levels.   Orders Placed This Encounter  Procedures   CBC with Differential/Platelet    Standing Status:   Future    Standing Expiration Date:   03/08/2023   Iron and TIBC([facility])    Standing Status:   Future    Standing Expiration Date:   03/08/2023   Ferritin    Standing Status:   Future    Standing Expiration Date:   03/07/2023   Vitamin B12    Standing Status:   Future    Standing Expiration Date:   03/08/2023   Folate    Standing Status:   Future    Standing Expiration Date:   03/08/2023    All questions were answered. The patient knows to call the clinic with any problems questions or concerns.   [name], MD    Follow-up in 6 months.  Earlie Server, MD, PhD [facility] Hematology Oncology 03/09/2022

## 2022-03-26 ENCOUNTER — Ambulatory Visit: Payer: BC Managed Care – PPO

## 2022-04-23 ENCOUNTER — Encounter: Payer: BC Managed Care – PPO | Attending: Surgery | Admitting: Skilled Nursing Facility1

## 2022-04-23 DIAGNOSIS — E669 Obesity, unspecified: Secondary | ICD-10-CM | POA: Diagnosis present

## 2022-04-24 ENCOUNTER — Encounter: Payer: Self-pay | Admitting: Skilled Nursing Facility1

## 2022-04-24 NOTE — Progress Notes (Signed)
Follow-up visit:  Post-Operative 04/23/2022 Surgery  Medical Nutrition Therapy:  Appt start time: 6:00pm end time:  7:00pm  Primary concerns today: Post-operative Bariatric Surgery Nutrition Management 6 Month Post-Op Class  Surgery date: 10/30/2021 Surgery type: RYGB Start weight at [facility]: 239.2 Weight today: 206.8   Body Composition Scale 11/13/2021 12/25/2021 04/24/2022  Current Body Weight 227.3 219.8 206.8  Total Body Fat % 44.0 43.2 41.6  Visceral Fat '13 13 11  '$ Fat-Free Mass % 55.9 56.7 58.3   Total Body Water % 42.4 42.8 43.6  Muscle-Mass lbs 30.3 30.2 30.1  BMI 43.0 41.6 39.1  Body Fat Displacement            Torso  lbs 62.0 58.8 53.2         Left Leg  lbs 12.4 11.7 10.6         Right Leg  lbs 12.4 11.7 10.6         Left Arm  lbs 6.2 5.8 5.3         Right Arm   lbs 6.2 5.8 5.3   Clinical: Medical History: sleep apnea, cholelithiasis, insomnia, GERD Medications: see list Relevant labs: Notable symptoms: GE reflux symptoms Drug allergies: none known Food allergies: none known    Information Reviewed/ Discussed During Appointment: -Review of composition scale numbers -Fluid requirements (64-100 ounces) -Protein requirements (60-80g) -Strategies for tolerating diet -Advancement of diet to include Starchy vegetables -Barriers to inclusion of new foods -Inclusion of appropriate multivitamin and calcium supplements  -Exercise recommendations   Fluid intake: adequate   Medications: See List Supplementation: appropriate    Using straws: no Drinking while eating: no Having you been chewing well: yes Chewing/swallowing difficulties: no Changes in vision: no Changes to mood/headaches: no Hair loss/Cahnges to skin/Changes to nails: no Any difficulty focusing or concentrating: no Sweating: no Dizziness/Lightheaded: no Palpitations: no  Carbonated beverages: no N/V/D/C/GAS: no Abdominal Pain: no Dumping syndrome: no  Recent physical activity:   ADL's  Progress Towards Goal(s):  In Progress Teaching method utilized: Visual & Auditory  Demonstrated degree of understanding via: Teach Back  Readiness Level: Action Barriers to learning/adherence to lifestyle change: none identified  Handouts given during visit include: Phase V diet Progression  Goals Sheet The Benefits of Exercise are endless..... Support Group Topics   Teaching Method Utilized:  Visual Auditory Hands on  Demonstrated degree of understanding via:  Teach Back   Monitoring/Evaluation:  Dietary intake, exercise, and body weight. Follow up in 3 months for 9 month post-op visit.

## 2022-05-13 ENCOUNTER — Encounter (INDEPENDENT_AMBULATORY_CARE_PROVIDER_SITE_OTHER): Payer: Self-pay

## 2022-09-04 ENCOUNTER — Inpatient Hospital Stay: Payer: BC Managed Care – PPO | Attending: Oncology

## 2022-09-04 DIAGNOSIS — Z9884 Bariatric surgery status: Secondary | ICD-10-CM | POA: Diagnosis present

## 2022-09-04 DIAGNOSIS — D508 Other iron deficiency anemias: Secondary | ICD-10-CM | POA: Diagnosis not present

## 2022-09-04 DIAGNOSIS — D500 Iron deficiency anemia secondary to blood loss (chronic): Secondary | ICD-10-CM

## 2022-09-04 LAB — COMPREHENSIVE METABOLIC PANEL
ALT: 17 U/L (ref 0–44)
AST: 12 U/L — ABNORMAL LOW (ref 15–41)
Albumin: 3.5 g/dL (ref 3.5–5.0)
Alkaline Phosphatase: 96 U/L (ref 38–126)
Anion gap: 8 (ref 5–15)
BUN: 12 mg/dL (ref 6–20)
CO2: 25 mmol/L (ref 22–32)
Calcium: 8.5 mg/dL — ABNORMAL LOW (ref 8.9–10.3)
Chloride: 106 mmol/L (ref 98–111)
Creatinine, Ser: 0.73 mg/dL (ref 0.44–1.00)
GFR, Estimated: >60 mL/min (ref >60)
Glucose, Bld: 94 mg/dL (ref 70–99)
Potassium: 3.9 mmol/L (ref 3.5–5.1)
Sodium: 139 mmol/L (ref 135–145)
Total Bilirubin: 0.3 mg/dL (ref 0.3–1.2)
Total Protein: 8 g/dL (ref 6.5–8.1)

## 2022-09-04 LAB — CBC WITH DIFFERENTIAL/PLATELET
Abs Immature Granulocytes: 0.01 10*3/uL (ref 0.00–0.07)
Basophils Absolute: 0 10*3/uL (ref 0.0–0.1)
Basophils Relative: 0 %
Eosinophils Absolute: 0.1 10*3/uL (ref 0.0–0.5)
Eosinophils Relative: 1 %
HCT: 40.8 % (ref 36.0–46.0)
Hemoglobin: 13.1 g/dL (ref 12.0–15.0)
Immature Granulocytes: 0 %
Lymphocytes Relative: 52 %
Lymphs Abs: 2.9 10*3/uL (ref 0.7–4.0)
MCH: 27.8 pg (ref 26.0–34.0)
MCHC: 32.1 g/dL (ref 30.0–36.0)
MCV: 86.6 fL (ref 80.0–100.0)
Monocytes Absolute: 0.5 10*3/uL (ref 0.1–1.0)
Monocytes Relative: 9 %
Neutro Abs: 2.1 10*3/uL (ref 1.7–7.7)
Neutrophils Relative %: 38 %
Platelets: 302 10*3/uL (ref 150–400)
RBC: 4.71 MIL/uL (ref 3.87–5.11)
RDW: 13.4 % (ref 11.5–15.5)
WBC: 5.6 10*3/uL (ref 4.0–10.5)
nRBC: 0 % (ref 0.0–0.2)

## 2022-09-04 LAB — FOLATE: Folate: 22 ng/mL (ref >5.9)

## 2022-09-04 LAB — FERRITIN: Ferritin: 17 ng/mL (ref 11–307)

## 2022-09-04 LAB — IRON AND TIBC
Iron: 53 ug/dL (ref 28–170)
Saturation Ratios: 10 % — ABNORMAL LOW (ref 10.4–31.8)
TIBC: 529 ug/dL — ABNORMAL HIGH (ref 250–450)
UIBC: 476 ug/dL

## 2022-09-04 LAB — VITAMIN B12: Vitamin B-12: 298 pg/mL (ref 180–914)

## 2022-09-09 ENCOUNTER — Other Ambulatory Visit: Payer: Self-pay | Admitting: Oncology

## 2022-09-09 MED FILL — Iron Sucrose Inj 20 MG/ML (Fe Equiv): INTRAVENOUS | Qty: 10 | Status: AC

## 2022-09-10 ENCOUNTER — Inpatient Hospital Stay: Payer: BC Managed Care – PPO

## 2022-09-10 ENCOUNTER — Other Ambulatory Visit: Payer: BC Managed Care – PPO

## 2022-09-10 ENCOUNTER — Inpatient Hospital Stay (HOSPITAL_BASED_OUTPATIENT_CLINIC_OR_DEPARTMENT_OTHER): Payer: BC Managed Care – PPO | Admitting: Oncology

## 2022-09-10 DIAGNOSIS — K9589 Other complications of other bariatric procedure: Secondary | ICD-10-CM

## 2022-09-10 DIAGNOSIS — D508 Other iron deficiency anemias: Secondary | ICD-10-CM

## 2022-09-10 DIAGNOSIS — E538 Deficiency of other specified B group vitamins: Secondary | ICD-10-CM

## 2022-09-10 DIAGNOSIS — D500 Iron deficiency anemia secondary to blood loss (chronic): Secondary | ICD-10-CM

## 2022-09-10 MED ORDER — SODIUM CHLORIDE 0.9 % IV SOLN
200.0000 mg | Freq: Once | INTRAVENOUS | Status: AC
  Administered 2022-09-10: 200 mg via INTRAVENOUS
  Filled 2022-09-10: qty 200

## 2022-09-10 MED ORDER — SODIUM CHLORIDE 0.9 % IV SOLN
INTRAVENOUS | Status: DC
  Filled 2022-09-10: qty 250

## 2022-09-10 MED ORDER — B-12 2500 MCG SL SUBL: 1.0000 | SUBLINGUAL_TABLET | Freq: Every day | SUBLINGUAL | 1 refills | Status: AC

## 2022-09-10 NOTE — Patient Instructions (Signed)

## 2022-09-11 ENCOUNTER — Encounter: Payer: Self-pay | Admitting: Oncology

## 2022-09-11 DIAGNOSIS — E538 Deficiency of other specified B group vitamins: Secondary | ICD-10-CM | POA: Insufficient documentation

## 2022-09-11 NOTE — Progress Notes (Signed)
Hematology/Oncology Progress note [phone] [phone]     CHIEF COMPLAINTS/REASON FOR VISIT:  Iron deficiency anemia and gastric bypass  ASSESSMENT & PLAN:   Iron deficiency anemia following bariatric surgery Iron deficiency anemia, in the setting of gastric bypass. Labs are reviewed and discussed with patient. Decreased iron saturation. In the setting of gastric bypass, I recommend maintenance dose of Venofer '200mg'$  x 3  Follow up in 6 months.   Hypocalcemia Recommend calcium and vitamin D supplementation.   Low serum vitamin B12 B12 <400, recommend patient to start with sublingual B12 2548mg daily.  Repeat B12 at next visit, if persistently low, will start B12 injections.   Orders Placed This Encounter  Procedures   CBC with Differential ([facility])    Standing Status:   Future    Standing Expiration Date:   09/11/2023   Iron and TIBC    Standing Status:   Future    Standing Expiration Date:   09/11/2023   Ferritin    Standing Status:   Future    Standing Expiration Date:   09/11/2023   Vitamin B12    Standing Status:   Future    Standing Expiration Date:   09/11/2023   VITAMIN D 25 Hydroxy (Vit-D Deficiency, Fractures)    Standing Status:   Future    Standing Expiration Date:   09/11/2023   Follow up in 6 months.  All questions were answered. The patient knows to call the clinic with any problems, questions or concerns.  ZEarlie Server MD, PhD [facility] Oncology 09/10/2022   HISTORY OF PRESENTING ILLNESS:   Nancy Coxeis a  37y.o.  female with PMH listed below was seen in consultation at the request of  CWaunita Schooner MD  for evaluation of anemia  10/30/2021, [facility] 11/01/2021, hemoglobin 7.6.  MCV 70.8,WBC 13.4, platelet count 365. 11/21/2021, patient had iron panel done which showed Iron saturation 4, folate 11.5  Patient was referred to establish care for management of  anemia. +fatigue.  Denies nausea vomiting abdominal pain, Denies black or bloody stool.  INTERVAL HISTORY Nancy Cousinois a 37y.o. female who has above history reviewed by me today presents for follow up visit for management of IDA She tolerates venofer treatments. Fatigue, chronic, unchanged.   Review of Systems  Constitutional:  Positive for fatigue. Negative for appetite change, chills and fever.  HENT:   Negative for hearing loss and voice change.   Eyes:  Negative for eye problems.  Respiratory:  Negative for chest tightness and cough.   Cardiovascular:  Negative for chest pain.  Gastrointestinal:  Negative for abdominal distention, abdominal pain and blood in stool.  Endocrine: Negative for hot flashes.  Genitourinary:  Negative for difficulty urinating and frequency.   Musculoskeletal:  Negative for arthralgias.  Skin:  Negative for itching and rash.  Neurological:  Negative for extremity weakness.  Hematological:  Negative for adenopathy.  Psychiatric/Behavioral:  Negative for confusion.     MEDICAL HISTORY:  Past Medical History:  Diagnosis Date   Allergic rhinitis    Encounter for insertion of mirena IUD    GERD (gastroesophageal reflux disease)    Gonorrhea    Headache    Herpes genitalia    High-risk pregnancy 01/17/2014   chorio/mild utering atony   History of hiatal hernia    History of syphilis    Obesity    Sleep apnea    awaitin new one  Syphilis     SURGICAL HISTORY: Past Surgical History:  Procedure Laterality Date   BREAST REDUCTION SURGERY Bilateral 11/02/2019   Procedure: MAMMARY REDUCTION  (BREAST);  Surgeon: Cindra Presume, MD;  Location: [place];  Service: Plastics;  Laterality: Bilateral;  3 hours   CHOLECYSTECTOMY     10/2020   FRACTURE SURGERY  2004   L left tibia   GASTRIC ROUX-EN-Y N/A 10/30/2021   Procedure: LAPAROSCOPIC ROUX-EN-Y GASTRIC BYPASS WITH UPPER ENDOSCOPY, conversion from sleeve;  Surgeon: Johnathan Hausen, MD;  Location: [facility];  Service: General;  Laterality: N/A;   LAPAROSCOPIC GASTRIC SLEEVE RESECTION N/A 07/25/2015   Procedure: LAPAROSCOPIC GASTRIC SLEEVE RESECTION;  Surgeon: Excell Seltzer, MD;  Location: [facility];  Service: General;  Laterality: N/A;   LAPAROSCOPIC GASTRIC SLEEVE RESECTION      SOCIAL HISTORY: Social History   Socioeconomic History   Marital status: Married    Spouse name: Insurance claims handler   Number of children: 1   Years of education: college   Highest education level: Not on file  Occupational History   Occupation: AR specialist- Sport and exercise psychologist: [employer]    Comment: [employer]  Tobacco Use   Smoking status: Never   Smokeless tobacco: Never  Vaping Use   Vaping Use: Never used  Substance and Sexual Activity   Alcohol use: Not Currently   Drug use: No   Sexual activity: Yes    Birth control/protection: Inserts    Comment: Nuvaring  Other Topics Concern   Not on file  Social History Narrative   09/09/19   From: [place] - her mom moved her after [employer] finished college   Living: with husband Insurance claims handler and daughter [name]   Work: Medical illustrator - Insurance claims handler      Family: mom nearby      Enjoys: reading, spend time with daughter - swimming, dancing      Exercise: not currently, planning to start exercise plan with sister   Diet: intermittent fasting       Safety   Seat belts: Yes    Guns: No   Safe in relationships: Yes    Social Determinants of Radio broadcast assistant Strain: Not on file  Food Insecurity: Not on file  Transportation Needs: Not on file  Physical Activity: Not on file  Stress: Not on file  Social Connections: Not on file  Intimate Partner Violence: Not on file    FAMILY HISTORY: Family History  Problem Relation Age of Onset   Hypertension Father    Hypertension Maternal Grandmother    Diabetes Maternal Grandfather    Hypertension Maternal Grandfather    Other Cousin        breast augmentation 2/2 to pain     ALLERGIES:  has No Known Allergies.  MEDICATIONS:  Current Outpatient Medications  Medication Sig Dispense Refill   acetaminophen (TYLENOL) 500 MG tablet Take 500 mg by mouth every 6 (six) hours as needed for mild pain.     Cyanocobalamin (B-12) 2500 MCG SUBL Place 1 tablet under the tongue daily at 2 PM. 90 tablet 1   etonogestrel-ethinyl estradiol (NUVARING) 0.12-0.015 MG/24HR vaginal ring Insert vaginally and leave in place for 3 consecutive weeks, then remove for 1 week. 3 each 3   fluconazole (DIFLUCAN) 150 MG tablet One pill every 3 days x 3 days then one pill weekly x 6 months 27 tablet 0   loratadine (CLARITIN) 10 MG tablet Take 10 mg by mouth daily  as needed for allergies.     valACYclovir (VALTREX) 500 MG tablet Take 1 tablet (500 mg total) by mouth daily as needed (outbreak). 30 tablet 5   phentermine (ADIPEX-P) 37.5 MG tablet Take 1 tablet (37.5 mg total) by mouth daily before breakfast. (Patient not taking: Reported on 12/05/2021) 30 tablet 0   No current facility-administered medications for this visit.     PHYSICAL EXAMINATION:  Vitals:   09/10/22 1459  BP: 103/79  Pulse: 92  Temp: 97.8 F (36.6 C)  SpO2: 97%   Filed Weights   09/10/22 1459  Weight: 212 lb 14.4 oz (96.6 kg)    Physical Exam Constitutional:      General: She is not in acute distress. HENT:     Head: Normocephalic and atraumatic.  Eyes:     General: No scleral icterus. Cardiovascular:     Rate and Rhythm: Normal rate and regular rhythm.     Heart sounds: Normal heart sounds.  Pulmonary:     Effort: Pulmonary effort is normal. No respiratory distress.     Breath sounds: No wheezing.  Abdominal:     General: Bowel sounds are normal. There is no distension.     Palpations: Abdomen is soft.  Musculoskeletal:        General: Normal range of motion.     Cervical back: Normal range of motion and neck supple.  Skin:    General: Skin is warm and dry.     Findings: No erythema or rash.   Neurological:     Mental Status: She is alert and oriented to person, place, and time. Mental status is at baseline.     Cranial Nerves: No cranial nerve deficit.     Coordination: Coordination normal.  Psychiatric:        Mood and Affect: Mood normal.     LABORATORY DATA:  I have reviewed the data as listed Lab Results  Component Value Date   WBC 5.6 09/04/2022   HGB 13.1 09/04/2022   HCT 40.8 09/04/2022   MCV 86.6 09/04/2022   PLT 302 09/04/2022   Recent Labs    10/08/21 1330 10/30/21 1402 09/04/22 1507  NA 139  --  139  K 3.4*  --  3.9  CL 110  --  106  CO2 22  --  25  GLUCOSE 83  --  94  BUN 9  --  12  CREATININE 0.62 0.69 0.73  CALCIUM 8.5*  --  8.5*  GFRNONAA >60 >60 >60  PROT 7.8  --  8.0  ALBUMIN 3.5  --  3.5  AST 12*  --  12*  ALT 18  --  17  ALKPHOS 88  --  96  BILITOT 0.2*  --  0.3    Iron/TIBC/Ferritin/ %Sat    Component Value Date/Time   IRON 53 09/04/2022 1507   TIBC 529 (H) 09/04/2022 1507   FERRITIN 17 09/04/2022 1507   IRONPCTSAT 10 (L) 09/04/2022 1507      RADIOGRAPHIC STUDIES: I have personally reviewed the radiological images as listed and agreed with the findings in the report. No results found.

## 2022-09-11 NOTE — Assessment & Plan Note (Signed)
B12 <400, recommend patient to start with sublingual B12 2526mg daily.  Repeat B12 at next visit, if persistently low, will start B12 injections.

## 2022-09-11 NOTE — Assessment & Plan Note (Signed)
Recommend calcium and vitamin D supplementation.

## 2022-09-11 NOTE — Assessment & Plan Note (Addendum)
Iron deficiency anemia, in the setting of gastric bypass. Labs are reviewed and discussed with patient. Decreased iron saturation. In the setting of gastric bypass, I recommend maintenance dose of Venofer '200mg'$  x 3  Follow up in 6 months.

## 2022-09-17 ENCOUNTER — Inpatient Hospital Stay: Payer: BC Managed Care – PPO

## 2022-09-20 ENCOUNTER — Inpatient Hospital Stay: Payer: BC Managed Care – PPO | Attending: Oncology

## 2022-09-20 VITALS — BP 122/76 | HR 85 | Temp 97.2°F

## 2022-09-20 DIAGNOSIS — Z9884 Bariatric surgery status: Secondary | ICD-10-CM | POA: Diagnosis present

## 2022-09-20 DIAGNOSIS — K9589 Other complications of other bariatric procedure: Secondary | ICD-10-CM

## 2022-09-20 DIAGNOSIS — E538 Deficiency of other specified B group vitamins: Secondary | ICD-10-CM | POA: Insufficient documentation

## 2022-09-20 DIAGNOSIS — D508 Other iron deficiency anemias: Secondary | ICD-10-CM | POA: Insufficient documentation

## 2022-09-20 MED ORDER — CYANOCOBALAMIN 1000 MCG/ML IJ SOLN
1000.0000 ug | Freq: Once | INTRAMUSCULAR | Status: AC
  Administered 2022-09-20: 1000 ug via INTRAMUSCULAR
  Filled 2022-09-20: qty 1

## 2022-09-20 MED ORDER — SODIUM CHLORIDE 0.9 % IV SOLN
200.0000 mg | Freq: Once | INTRAVENOUS | Status: AC
  Administered 2022-09-20: 200 mg via INTRAVENOUS
  Filled 2022-09-20: qty 200

## 2022-09-20 MED ORDER — SODIUM CHLORIDE 0.9 % IV SOLN
INTRAVENOUS | Status: DC
  Filled 2022-09-20: qty 250

## 2022-09-20 NOTE — Progress Notes (Signed)
Pt has been educated and understands. Pt refused to stay 30 mins after iron infusion. VSS. 

## 2022-09-24 ENCOUNTER — Inpatient Hospital Stay: Payer: BC Managed Care – PPO

## 2022-09-27 ENCOUNTER — Inpatient Hospital Stay: Payer: BC Managed Care – PPO

## 2022-09-27 VITALS — BP 110/78 | HR 88 | Temp 98.4°F | Resp 18

## 2022-09-27 DIAGNOSIS — D508 Other iron deficiency anemias: Secondary | ICD-10-CM

## 2022-09-27 MED ORDER — SODIUM CHLORIDE 0.9 % IV SOLN
Freq: Once | INTRAVENOUS | Status: AC
  Filled 2022-09-27: qty 250

## 2022-09-27 MED ORDER — SODIUM CHLORIDE 0.9 % IV SOLN
200.0000 mg | Freq: Once | INTRAVENOUS | Status: AC
  Administered 2022-09-27: 200 mg via INTRAVENOUS
  Filled 2022-09-27: qty 200

## 2022-12-02 ENCOUNTER — Telehealth: Payer: Self-pay

## 2022-12-02 NOTE — Telephone Encounter (Signed)
Chart reviewed. Past last seen 11/29/21. Notified  pharmacy patient needs to schedule appointment before refill can be authorized. They will notify patient.

## 2022-12-09 IMAGING — US US ABDOMEN LIMITED
1 series · 14 of 25 positions shown · non-contrast
Comparison: None.

CLINICAL DATA: Right upper quadrant pain.

EXAM:
ULTRASOUND ABDOMEN LIMITED RIGHT UPPER QUADRANT

[Series 1: us abdomen limited ruq (liver/gb) · 14 of 41 slices shown]
[im 1/41]
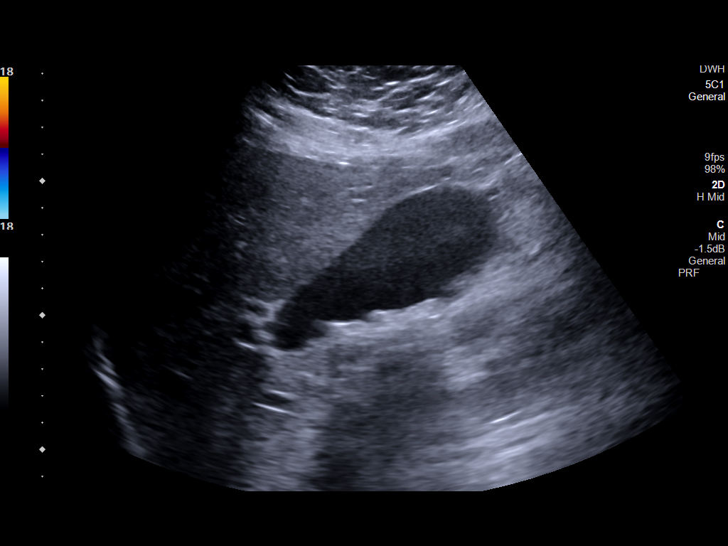
[im 4/41]
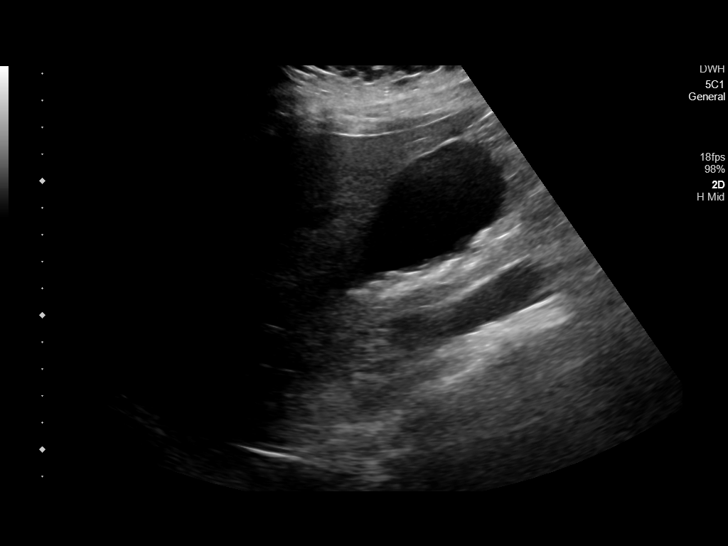
[im 7/41]
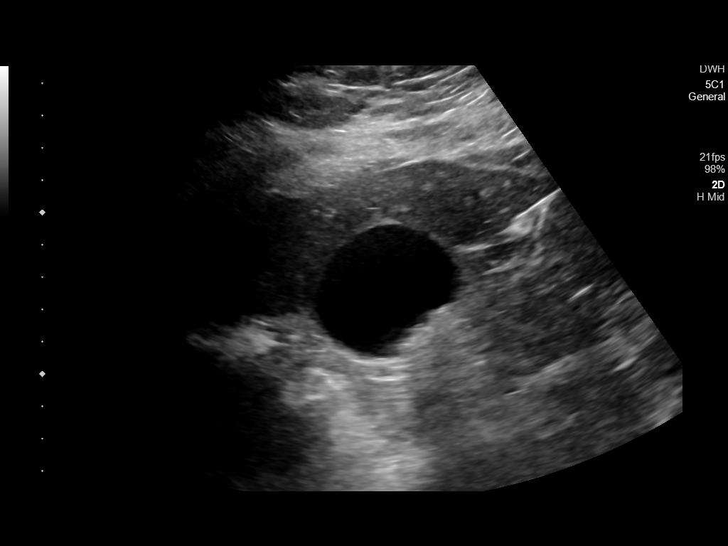
[im 11/41]
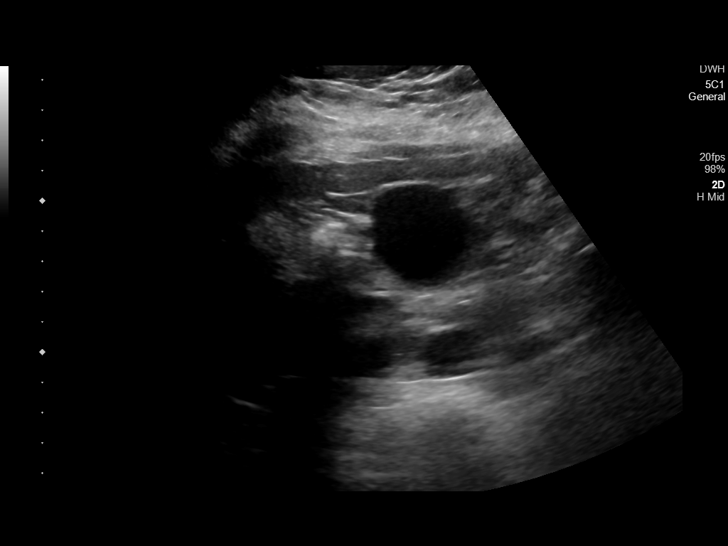
[im 14/41]
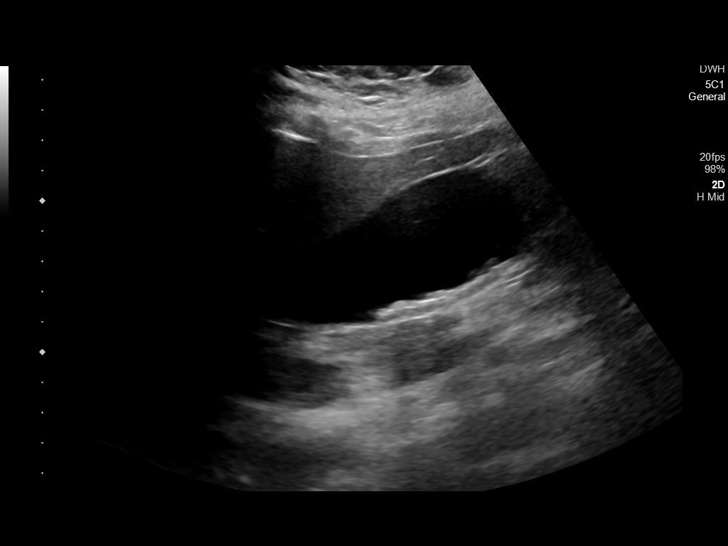
[im 16/41]
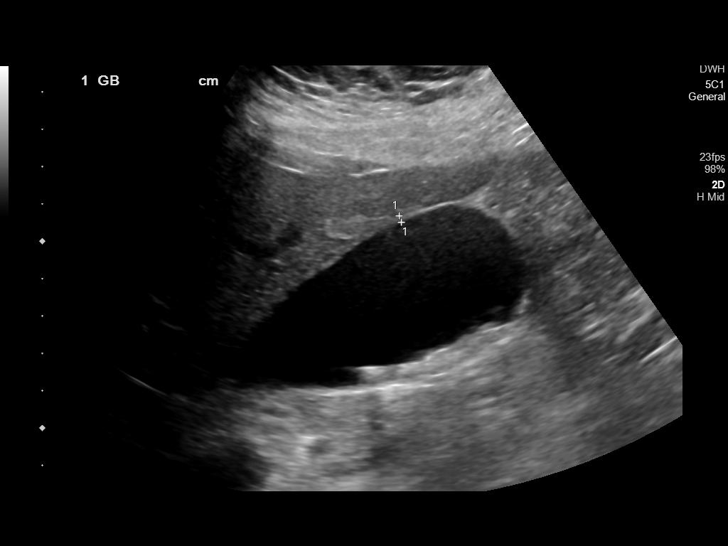
[im 19/41]
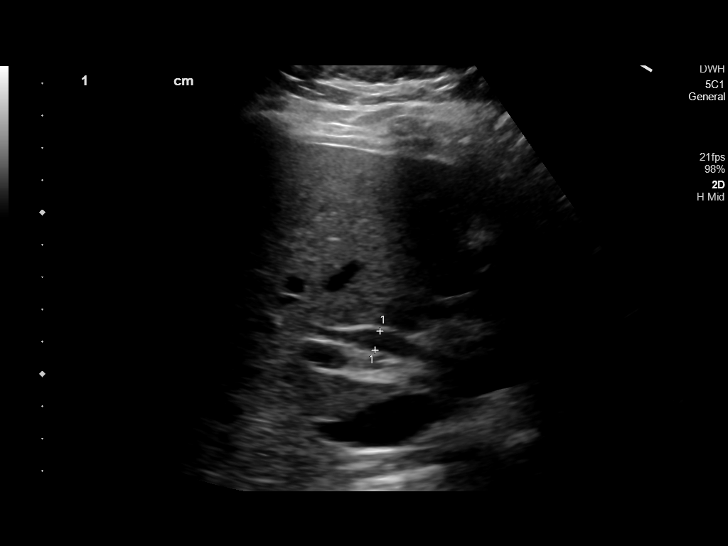
[im 22/41]
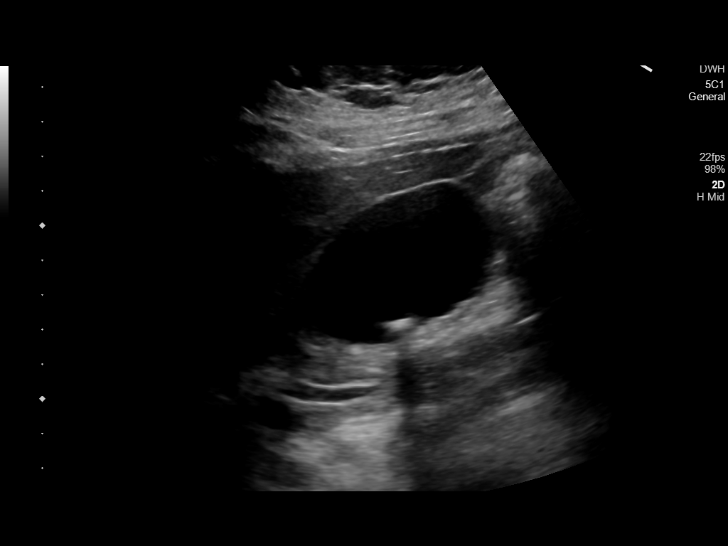
[im 26/41]
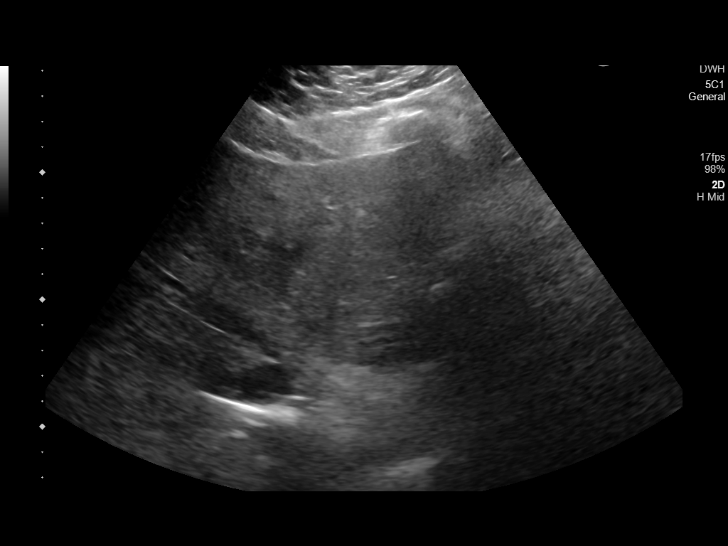
[im 27/41]
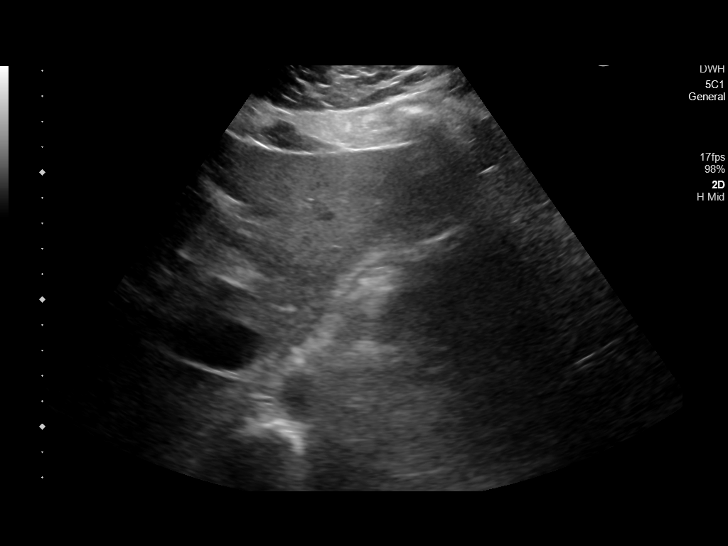
[im 31/41]
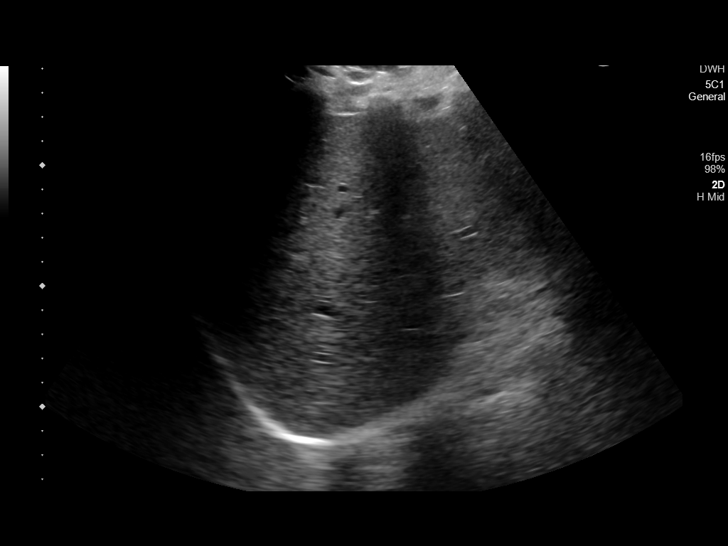
[im 34/41]
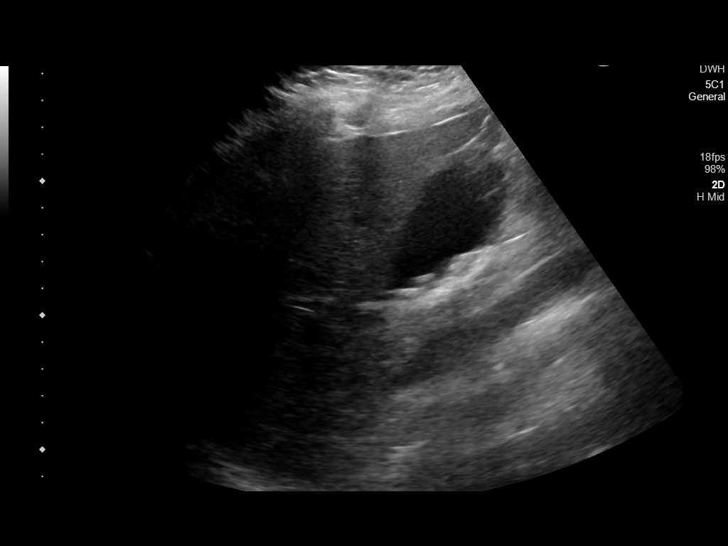
[im 37/41]
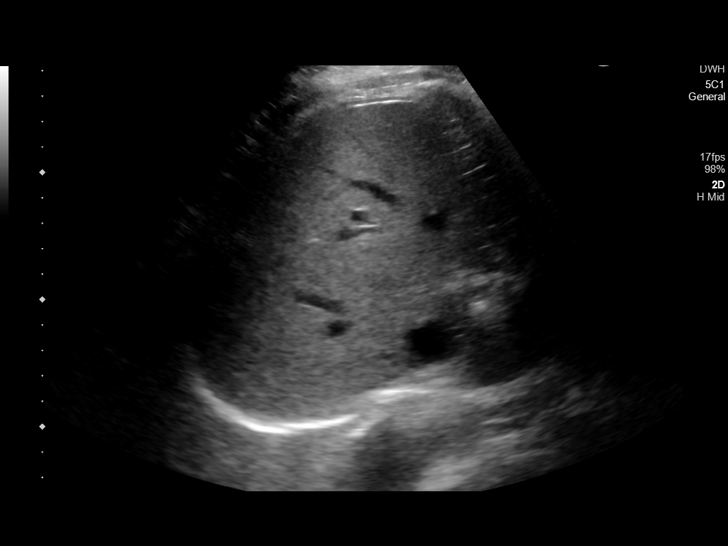
[im 41/41]
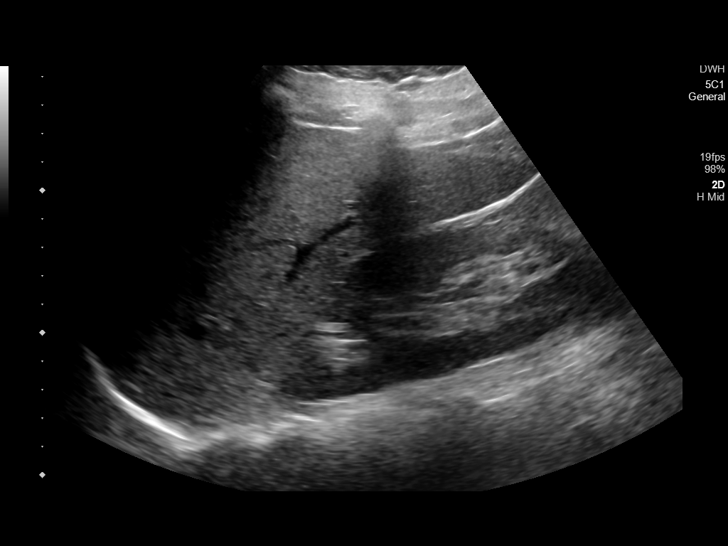

[14 of 25 positions shown; findings below may reference images not displayed]

FINDINGS: Gallbladder:

Multiple calcified gallstones within the gallbladder lumen. No
gallbladder wall wall thickening or pericholecystic fluid
visualized. No sonographic Murphy sign noted by sonographer;
however, patient is premedicated.

Common bile duct:

Diameter: 6 mm.

Liver:

No focal lesion identified. Within normal limits in parenchymal
echogenicity. Portal vein is patent on color Doppler imaging with
normal direction of blood flow towards the liver.

Other: None.
IMPRESSION: Cholelithiasis with no findings of acute cholecystitis or
choledocholithiasis.

## 2023-01-30 ENCOUNTER — Encounter: Payer: Self-pay | Admitting: Obstetrics and Gynecology

## 2023-01-30 ENCOUNTER — Ambulatory Visit (INDEPENDENT_AMBULATORY_CARE_PROVIDER_SITE_OTHER): Payer: BC Managed Care – PPO | Admitting: Obstetrics and Gynecology

## 2023-01-30 VITALS — BP 110/80 | Ht 61.0 in | Wt 226.0 lb

## 2023-01-30 DIAGNOSIS — B3731 Acute candidiasis of vulva and vagina: Secondary | ICD-10-CM

## 2023-01-30 DIAGNOSIS — N920 Excessive and frequent menstruation with regular cycle: Secondary | ICD-10-CM

## 2023-01-30 DIAGNOSIS — Z01419 Encounter for gynecological examination (general) (routine) without abnormal findings: Secondary | ICD-10-CM | POA: Diagnosis not present

## 2023-01-30 DIAGNOSIS — Z713 Dietary counseling and surveillance: Secondary | ICD-10-CM

## 2023-01-30 DIAGNOSIS — Z3044 Encounter for surveillance of vaginal ring hormonal contraceptive device: Secondary | ICD-10-CM

## 2023-01-30 DIAGNOSIS — B009 Herpesviral infection, unspecified: Secondary | ICD-10-CM | POA: Insufficient documentation

## 2023-01-30 DIAGNOSIS — Z803 Family history of malignant neoplasm of breast: Secondary | ICD-10-CM | POA: Insufficient documentation

## 2023-01-30 DIAGNOSIS — D500 Iron deficiency anemia secondary to blood loss (chronic): Secondary | ICD-10-CM

## 2023-01-30 MED ORDER — ETONOGESTREL-ETHINYL ESTRADIOL 0.12-0.015 MG/24HR VA RING: VAGINAL_RING | VAGINAL | 3 refills | Status: DC

## 2023-01-30 MED ORDER — FLUCONAZOLE 150 MG PO TABS: 150.0000 mg | ORAL_TABLET | Freq: Once | ORAL | 1 refills | Status: DC

## 2023-01-30 MED ORDER — PHENTERMINE HCL 37.5 MG PO TABS: 37.5000 mg | ORAL_TABLET | Freq: Every day | ORAL | 0 refills | Status: DC

## 2023-01-30 MED ORDER — VALACYCLOVIR HCL 500 MG PO TABS: 500.0000 mg | ORAL_TABLET | Freq: Two times a day (BID) | ORAL | 1 refills | Status: DC

## 2023-01-30 NOTE — Patient Instructions (Signed)
I value your feedback and you entrusting us with your care. If you get a [facility] patient survey, I would appreciate you taking the time to let us know about your experience today. Thank you! ? ? ?

## 2023-01-30 NOTE — Progress Notes (Signed)
PCP:  Gweneth Dimitri, MD   Chief Complaint  Patient presents with   Gynecologic Exam    No concerns     HPI:      Ms. Nancy Owen is a 37 y.o. G2P1011 whose LMP was Patient's last menstrual period was 01/24/2023 (exact date)., presents today for her annual examination.  Her menses are regular every 28-30 days, lasting 6-7 days, mod flow, no BTB.  Dysmenorrhea mild, improved with tylenol (can't have NSAIDs due to hx of bariatric surg); has several 5 mm leio per GYN u/s 6/23; hx of IDA after bariatric surgery, doing Fe infustions with hematology. WNL CBC 2/24  Sex activity: single partner, contraception - NuvaRing vaginal inserts. No pain/bleeding. Last Pap: 09/01/20 Results were: no abnormalities /neg HPV DNA  Hx of STDs: GC, syphilis, HSV 2--takes valtrex prn sx, needs Rx RF  There is a FH of breast cancer in her mat grt aunt, genetic testing not indicated. There is no FH of ovarian cancer. The patient does do self-breast exams.  Tobacco use: The patient denies current or previous tobacco use. Alcohol use: social drinker No drug use.  Exercise: moderately active  She does get adequate calcium and Vitamin D in her diet. Labs with PCP.  Would like Rx RF phentermine. Did with Dr Bonney Aid in past after bariatric surg. Is trying to lose more wt; is exercising some daily.   Hx of frequent yeast vag, treats with diflucan. Needs Rx RF. Sx are increased vag d/c and irritation. Uses unscented soap, dryer sheets; wears cotton underwear, rare thongs. Not taking probiotics   Patient Active Problem List   Diagnosis Date Noted   HSV-2 (herpes simplex virus 2) infection 01/30/2023   Menorrhagia with regular cycle 01/30/2023   Family history of breast cancer 01/30/2023   Low serum vitamin B12 09/11/2022   Hypocalcemia 09/10/2022   Iron deficiency anemia following bariatric surgery 12/06/2021   S/P gastric bypass 10/30/2021   Symptomatic cholelithiasis    Lymphedema 01/25/2020   Pain in  limb 12/21/2019   Swelling of limb 12/21/2019   S/P bilateral breast reduction 11/24/2019   Macromastia 09/09/2019   Upper back pain 09/09/2019   Hx of fracture of tibia 09/09/2019   History of bariatric surgery 05/09/2019   History of syphilis 05/09/2019   Herpes genitalia 05/09/2019   Morbid obesity with body mass index of 40.0-49.9 (HCC) 07/25/2015   Well woman exam 01/03/2015   OSA (obstructive sleep apnea) 08/02/2011   Insomnia 08/02/2011   Obesity 08/02/2011    Past Surgical History:  Procedure Laterality Date   BREAST REDUCTION SURGERY Bilateral 11/02/2019   Procedure: MAMMARY REDUCTION  (BREAST);  Surgeon: Allena Napoleon, MD;  Location: [facility];  Service: Plastics;  Laterality: Bilateral;  3 hours   CHOLECYSTECTOMY     10/2020   FRACTURE SURGERY  2004   L left tibia   GASTRIC ROUX-EN-Y N/A 10/30/2021   Procedure: LAPAROSCOPIC ROUX-EN-Y GASTRIC BYPASS WITH UPPER ENDOSCOPY, conversion from sleeve;  Surgeon: Luretha Murphy, MD;  Location: [facility];  Service: General;  Laterality: N/A;   LAPAROSCOPIC GASTRIC SLEEVE RESECTION N/A 07/25/2015   Procedure: LAPAROSCOPIC GASTRIC SLEEVE RESECTION;  Surgeon: Glenna Fellows, MD;  Location: [facility];  Service: General;  Laterality: N/A;   LAPAROSCOPIC GASTRIC SLEEVE RESECTION      Family History  Problem Relation Age of Onset   Hypertension Father    Hypertension Maternal Grandmother    Diabetes Maternal Grandfather    Hypertension Maternal Grandfather  Breast cancer Other        30s/40s   Other Cousin        breast augmentation 2/2 to pain    Social History   Socioeconomic History   Marital status: Married    Spouse name: Glass blower/designer   Number of children: 1   Years of education: college   Highest education level: Not on file  Occupational History   Occupation: AR specialist- IT trainer: [employer]    Comment: [employer]  Tobacco Use   Smoking status: Never   Smokeless tobacco: Never  Vaping Use    Vaping status: Never Used  Substance and Sexual Activity   Alcohol use: Not Currently   Drug use: No   Sexual activity: Yes    Birth control/protection: Inserts    Comment: Nuvaring  Other Topics Concern   Not on file  Social History Narrative   09/09/19   From: [place] - her mom moved her after [place] finished college   Living: with husband Wyonia Hough and daughter Hessie Diener   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, minerals      Family: mom nearby      Enjoys: reading, spend time with daughter - swimming, dancing      Exercise: not currently, planning to start exercise plan with sister   Diet: intermittent fasting       Safety   Seat belts: Yes    Guns: No   Safe in relationships: Yes    Social Determinants of Corporate investment banker Strain: Not on file  Food Insecurity: Not on file  Transportation Needs: Not on file  Physical Activity: Not on file  Stress: Not on file  Social Connections: Not on file  Intimate Partner Violence: Not on file     Current Outpatient Medications:    acetaminophen (TYLENOL) 500 MG tablet, Take 500 mg by mouth every 6 (six) hours as needed for mild pain., Disp: , Rfl:    Cyanocobalamin (B-12) 2500 MCG SUBL, Place 1 tablet under the tongue daily at 2 PM., Disp: 90 tablet, Rfl: 1   fluconazole (DIFLUCAN) 150 MG tablet, Take 1 tablet (150 mg total) by mouth once for 1 dose. May repeat in 3 days if still having symptoms, Disp: 2 tablet, Rfl: 1   loratadine (CLARITIN) 10 MG tablet, Take 10 mg by mouth daily as needed for allergies., Disp: , Rfl:    etonogestrel-ethinyl estradiol (NUVARING) 0.12-0.015 MG/24HR vaginal ring, Insert vaginally and leave in place for 3 consecutive weeks, then remove for 1 week., Disp: 3 each, Rfl: 3   phentermine (ADIPEX-P) 37.5 MG tablet, Take 1 tablet (37.5 mg total) by mouth daily before breakfast., Disp: 30 tablet, Rfl: 0   valACYclovir (VALTREX) 500 MG tablet, Take 1 tablet (500 mg total) by mouth 2 (two) times daily.  For 3 days prn outbreak, Disp: 30 tablet, Rfl: 1     ROS:  Review of Systems  Constitutional:  Positive for fatigue. Negative for fever and unexpected weight change.  Respiratory:  Negative for cough, shortness of breath and wheezing.   Cardiovascular:  Negative for chest pain, palpitations and leg swelling.  Gastrointestinal:  Negative for blood in stool, constipation, diarrhea, nausea and vomiting.  Endocrine: Negative for cold intolerance, heat intolerance and polyuria.  Genitourinary:  Negative for dyspareunia, dysuria, flank pain, frequency, genital sores, hematuria, menstrual problem, pelvic pain, urgency, vaginal bleeding, vaginal discharge and vaginal pain.  Musculoskeletal:  Positive for arthralgias. Negative for back pain, joint swelling  and myalgias.  Skin:  Negative for rash.  Neurological:  Positive for headaches. Negative for dizziness, syncope, light-headedness and numbness.  Hematological:  Negative for adenopathy.  Psychiatric/Behavioral:  Negative for agitation, confusion, sleep disturbance and suicidal ideas. The patient is not nervous/anxious.    BREAST: No symptoms   Objective: BP 110/80   Ht 5\' 1"  (1.549 m)   Wt 226 lb (102.5 kg)   LMP 01/24/2023 (Exact Date)   BMI 42.70 kg/m    Physical Exam Constitutional:      Appearance: She is well-developed.  Genitourinary:     Vulva normal.     Right Labia: No rash, tenderness or lesions.    Left Labia: No tenderness, lesions or rash.    No vaginal discharge, erythema or tenderness.      Right Adnexa: not tender and no mass present.    Left Adnexa: not tender and no mass present.    No cervical friability or polyp.     Uterus is not enlarged or tender.  Breasts:    Right: No mass, nipple discharge, skin change or tenderness.     Left: No mass, nipple discharge, skin change or tenderness.  Neck:     Thyroid: No thyromegaly.  Cardiovascular:     Rate and Rhythm: Normal rate and regular rhythm.     Heart  sounds: Normal heart sounds. No murmur heard. Pulmonary:     Effort: Pulmonary effort is normal.     Breath sounds: Normal breath sounds.  Abdominal:     Palpations: Abdomen is soft.     Tenderness: There is no abdominal tenderness. There is no guarding or rebound.  Musculoskeletal:        General: Normal range of motion.     Cervical back: Normal range of motion.  Lymphadenopathy:     Cervical: No cervical adenopathy.  Neurological:     General: No focal deficit present.     Mental Status: She is alert and oriented to person, place, and time.     Cranial Nerves: No cranial nerve deficit.  Skin:    General: Skin is warm and dry.  Psychiatric:        Mood and Affect: Mood normal.        Behavior: Behavior normal.        Thought Content: Thought content normal.        Judgment: Judgment normal.  Vitals reviewed.     Assessment/Plan: Encounter for annual routine gynecological examination  Encounter for surveillance of vaginal ring hormonal contraceptive device - Plan: etonogestrel-ethinyl estradiol (NUVARING) 0.12-0.015 MG/24HR vaginal ring; Rx RF.   Menorrhagia with regular cycle--sx improved with nuvaring  Iron deficiency anemia due to chronic blood loss--due to bariatric surg. Doing Fe infusions with hematology  HSV-2 (herpes simplex virus 2) infection - Plan: valACYclovir (VALTREX) 500 MG tablet; Rx RF eRxd  Weight loss counseling, encounter for - Plan: phentermine (ADIPEX-P) 37.5 MG tablet; Rx RF eRxd for a couple months. F/u prn.   Candidal vaginitis - Plan: fluconazole (DIFLUCAN) 150 MG tablet; no sx today. Rx RF. Line dry underwear, add probiotics.   Meds ordered this encounter  Medications   etonogestrel-ethinyl estradiol (NUVARING) 0.12-0.015 MG/24HR vaginal ring    Sig: Insert vaginally and leave in place for 3 consecutive weeks, then remove for 1 week.    Dispense:  3 each    Refill:  3    Order Specific Question:   Supervising Provider    Answer:   Hildred Laser [  [id number]]   phentermine (ADIPEX-P) 37.5 MG tablet    Sig: Take 1 tablet (37.5 mg total) by mouth daily before breakfast.    Dispense:  30 tablet    Refill:  0    Order Specific Question:   Supervising Provider    Answer:   Hildred Laser [[id number]]   valACYclovir (VALTREX) 500 MG tablet    Sig: Take 1 tablet (500 mg total) by mouth 2 (two) times daily. For 3 days prn outbreak    Dispense:  30 tablet    Refill:  1    Order Specific Question:   Supervising Provider    Answer:   Hildred Laser [[id number]]   fluconazole (DIFLUCAN) 150 MG tablet    Sig: Take 1 tablet (150 mg total) by mouth once for 1 dose. May repeat in 3 days if still having symptoms    Dispense:  2 tablet    Refill:  1    Order Specific Question:   Supervising Provider    Answer:   Waymon Budge             GYN counsel adequate intake of calcium and vitamin D, diet and exercise     F/U  Return in about 1 year (around 01/30/2024).  Leva Baine B. Mcihael Hinderman, PA-C 01/30/2023 2:37 PM

## 2023-02-06 ENCOUNTER — Other Ambulatory Visit: Payer: Self-pay | Admitting: Obstetrics and Gynecology

## 2023-02-06 DIAGNOSIS — B009 Herpesviral infection, unspecified: Secondary | ICD-10-CM

## 2023-02-26 ENCOUNTER — Other Ambulatory Visit: Payer: Self-pay | Admitting: Obstetrics and Gynecology

## 2023-02-26 DIAGNOSIS — Z713 Dietary counseling and surveillance: Secondary | ICD-10-CM

## 2023-03-03 NOTE — Telephone Encounter (Signed)
I called in Rx RF phentermine to pharmacy, #30, RF0.  (I got new phone and awaiting [employer] ID set up on new phone in order to Rx controlled substances electronically. )

## 2023-03-10 ENCOUNTER — Inpatient Hospital Stay: Payer: BC Managed Care – PPO | Attending: Oncology

## 2023-03-10 DIAGNOSIS — E538 Deficiency of other specified B group vitamins: Secondary | ICD-10-CM | POA: Diagnosis present

## 2023-03-10 LAB — CBC WITH DIFFERENTIAL (CANCER CENTER ONLY)
Abs Immature Granulocytes: 0.01 10*3/uL (ref 0.00–0.07)
Basophils Absolute: 0 10*3/uL (ref 0.0–0.1)
Basophils Relative: 1 %
Eosinophils Absolute: 0.1 10*3/uL (ref 0.0–0.5)
Eosinophils Relative: 1 %
HCT: 39.5 % (ref 36.0–46.0)
Hemoglobin: 13 g/dL (ref 12.0–15.0)
Immature Granulocytes: 0 %
Lymphocytes Relative: 35 %
Lymphs Abs: 3 10*3/uL (ref 0.7–4.0)
MCH: 28.8 pg (ref 26.0–34.0)
MCHC: 32.9 g/dL (ref 30.0–36.0)
MCV: 87.4 fL (ref 80.0–100.0)
Monocytes Absolute: 0.5 10*3/uL (ref 0.1–1.0)
Monocytes Relative: 6 %
Neutro Abs: 5 10*3/uL (ref 1.7–7.7)
Neutrophils Relative %: 57 %
Platelet Count: 295 10*3/uL (ref 150–400)
RBC: 4.52 MIL/uL (ref 3.87–5.11)
RDW: 12.6 % (ref 11.5–15.5)
WBC Count: 8.6 10*3/uL (ref 4.0–10.5)
nRBC: 0 % (ref 0.0–0.2)

## 2023-03-10 LAB — IRON AND TIBC
Iron: 91 ug/dL (ref 28–170)
Saturation Ratios: 24 % (ref 10.4–31.8)
TIBC: 382 ug/dL (ref 250–450)
UIBC: 291 ug/dL

## 2023-03-10 LAB — VITAMIN B12: Vitamin B-12: 432 pg/mL (ref 180–914)

## 2023-03-10 LAB — FERRITIN: Ferritin: 50 ng/mL (ref 11–307)

## 2023-03-10 LAB — VITAMIN D 25 HYDROXY (VIT D DEFICIENCY, FRACTURES): Vit D, 25-Hydroxy: 18.96 ng/mL — ABNORMAL LOW (ref 30–100)

## 2023-03-11 ENCOUNTER — Ambulatory Visit: Payer: BC Managed Care – PPO

## 2023-03-11 ENCOUNTER — Ambulatory Visit: Payer: BC Managed Care – PPO | Admitting: Oncology

## 2023-03-12 ENCOUNTER — Inpatient Hospital Stay: Payer: BC Managed Care – PPO

## 2023-03-12 ENCOUNTER — Inpatient Hospital Stay (HOSPITAL_BASED_OUTPATIENT_CLINIC_OR_DEPARTMENT_OTHER): Payer: BC Managed Care – PPO | Admitting: Oncology

## 2023-03-12 ENCOUNTER — Encounter: Payer: Self-pay | Admitting: Oncology

## 2023-03-12 VITALS — BP 111/66 | HR 101 | Temp 98.4°F | Resp 18 | Wt 215.8 lb

## 2023-03-12 DIAGNOSIS — K9589 Other complications of other bariatric procedure: Secondary | ICD-10-CM

## 2023-03-12 DIAGNOSIS — E538 Deficiency of other specified B group vitamins: Secondary | ICD-10-CM

## 2023-03-12 DIAGNOSIS — D508 Other iron deficiency anemias: Secondary | ICD-10-CM

## 2023-03-12 MED ORDER — CYANOCOBALAMIN 1000 MCG/ML IJ SOLN
1000.0000 ug | Freq: Once | INTRAMUSCULAR | Status: AC
  Administered 2023-03-12: 1000 ug via INTRAMUSCULAR
  Filled 2023-03-12: qty 1

## 2023-03-12 NOTE — Progress Notes (Signed)
Hematology/Oncology Progress note [phone] [phone]     CHIEF COMPLAINTS/REASON FOR VISIT:  Iron deficiency anemia and gastric bypass  ASSESSMENT & PLAN:   Iron deficiency anemia following bariatric surgery Iron deficiency anemia, in the setting of gastric bypass. Labs are reviewed and discussed with patient. Lab Results  Component Value Date   HGB 13.0 03/10/2023   TIBC 382 03/10/2023   IRONPCTSAT 24 03/10/2023   FERRITIN 50 03/10/2023    Hold off IV Venofer Follow up in 6 months.   Low serum vitamin B12 B12 injection today.  She can decrease sublingual B12 to 2-3 time per week.    Orders Placed This Encounter  Procedures   CBC with Differential (Cancer Center Only)    Standing Status:   Future    Standing Expiration Date:   03/11/2024   Iron and TIBC    Standing Status:   Future    Standing Expiration Date:   03/11/2024   Ferritin    Standing Status:   Future    Standing Expiration Date:   03/11/2024   Retic Panel    Standing Status:   Future    Standing Expiration Date:   03/11/2024   Vitamin B12    Standing Status:   Future    Standing Expiration Date:   03/11/2024   Follow up in 6 months.  All questions were answered. The patient knows to call the clinic with any problems, questions or concerns.  Rickard Patience, MD, PhD [facility] Hematology Oncology 03/12/2023   HISTORY OF PRESENTING ILLNESS:   Nancy Owen is a  37 y.o.  female with PMH listed below was seen in consultation at the request of  Gweneth Dimitri, MD  for evaluation of anemia  10/30/2021, status post gastric bypass by Dr. Daphine Deutscher [facility]. 11/01/2021, hemoglobin 7.6.  MCV 70.8,WBC 13.4, platelet count 365. 11/21/2021, patient had iron panel done which showed Iron saturation 4, folate 11.5  Patient was referred to establish care for management of anemia. +fatigue.  Denies nausea vomiting abdominal pain, Denies black or bloody stool.  INTERVAL HISTORY Nancy Owen is a 37 y.o. female who has above history reviewed by me today presents for follow up visit for management of IDA She tolerates venofer treatments. Fatigue, chronic, unchanged.  She takes sublingual B12 daily. She does not like taking it daily due to the taste of medication.   Review of Systems  Constitutional:  Positive for fatigue. Negative for appetite change, chills and fever.  HENT:   Negative for hearing loss and voice change.   Eyes:  Negative for eye problems.  Respiratory:  Negative for chest tightness and cough.   Cardiovascular:  Negative for chest pain.  Gastrointestinal:  Negative for abdominal distention, abdominal pain and blood in stool.  Endocrine: Negative for hot flashes.  Genitourinary:  Negative for difficulty urinating and frequency.   Musculoskeletal:  Negative for arthralgias.  Skin:  Negative for itching and rash.  Neurological:  Negative for extremity weakness.  Hematological:  Negative for adenopathy.  Psychiatric/Behavioral:  Negative for confusion.     MEDICAL HISTORY:  Past Medical History:  Diagnosis Date   Allergic rhinitis    Encounter for insertion of mirena IUD    GERD (gastroesophageal reflux disease)    Gonorrhea    Headache    Herpes genitalia    High-risk pregnancy 01/17/2014   chorio/mild utering atony   History of hiatal hernia    History of syphilis  Obesity    Sleep apnea    awaitin new one   Syphilis     SURGICAL HISTORY: Past Surgical History:  Procedure Laterality Date   BREAST REDUCTION SURGERY Bilateral 11/02/2019   Procedure: MAMMARY REDUCTION  (BREAST);  Surgeon: Allena Napoleon, MD;  Location: [facility];  Service: Plastics;  Laterality: Bilateral;  3 hours   CHOLECYSTECTOMY     10/2020   FRACTURE SURGERY  2004   L left tibia   GASTRIC ROUX-EN-Y N/A 10/30/2021   Procedure: LAPAROSCOPIC ROUX-EN-Y GASTRIC BYPASS WITH UPPER ENDOSCOPY, conversion from sleeve;  Surgeon: Luretha Murphy, MD;   Location: [facility];  Service: General;  Laterality: N/A;   LAPAROSCOPIC GASTRIC SLEEVE RESECTION N/A 07/25/2015   Procedure: LAPAROSCOPIC GASTRIC SLEEVE RESECTION;  Surgeon: Glenna Fellows, MD;  Location: [facility];  Service: General;  Laterality: N/A;   LAPAROSCOPIC GASTRIC SLEEVE RESECTION      SOCIAL HISTORY: Social History   Socioeconomic History   Marital status: Married    Spouse name: Glass blower/designer   Number of children: 1   Years of education: college   Highest education level: Not on file  Occupational History   Occupation: AR specialist- IT trainer: [employer]    Comment: [employer]  Tobacco Use   Smoking status: Never   Smokeless tobacco: Never  Vaping Use   Vaping status: Never Used  Substance and Sexual Activity   Alcohol use: Not Currently   Drug use: No   Sexual activity: Yes    Birth control/protection: Inserts    Comment: Nuvaring  Other Topics Concern   Not on file  Social History Narrative   09/09/19   From: [place] - her mom moved her after [employer] finished college   Living: with husband Glass blower/designer and daughter Hessie Diener   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, [employer]      Family: mom nearby      Enjoys: reading, spend time with daughter - swimming, dancing      Exercise: not currently, planning to start exercise plan with sister   Diet: intermittent fasting       Safety   Seat belts: Yes    Guns: No   Safe in relationships: Yes    Social Determinants of Corporate investment banker Strain: Not on file  Food Insecurity: Not on file  Transportation Needs: Not on file  Physical Activity: Not on file  Stress: Not on file  Social Connections: Not on file  Intimate Partner Violence: Not on file    FAMILY HISTORY: Family History  Problem Relation Age of Onset   Hypertension Father    Hypertension Maternal Grandmother    Diabetes Maternal Grandfather    Hypertension Maternal Grandfather    Breast cancer Other        30s/40s   Other Cousin         breast augmentation 2/2 to pain    ALLERGIES:  has No Known Allergies.  MEDICATIONS:  Current Outpatient Medications  Medication Sig Dispense Refill   acetaminophen (TYLENOL) 500 MG tablet Take 500 mg by mouth every 6 (six) hours as needed for mild pain.     Cyanocobalamin (B-12) 2500 MCG SUBL Place 1 tablet under the tongue daily at 2 PM. 90 tablet 1   etonogestrel-ethinyl estradiol (NUVARING) 0.12-0.015 MG/24HR vaginal ring Insert vaginally and leave in place for 3 consecutive weeks, then remove for 1 week. 3 each 3   loratadine (CLARITIN) 10 MG tablet Take 10 mg by  mouth daily as needed for allergies.     phentermine (ADIPEX-P) 37.5 MG tablet Take 1 tablet (37.5 mg total) by mouth daily before breakfast. 30 tablet 0   valACYclovir (VALTREX) 500 MG tablet Take 1 tablet (500 mg total) by mouth 2 (two) times daily. For 3 days prn outbreak 30 tablet 1   No current facility-administered medications for this visit.     PHYSICAL EXAMINATION:  Vitals:   03/12/23 1459  BP: 111/66  Pulse: (!) 101  Resp: 18  Temp: 98.4 F (36.9 C)  SpO2: 100%   Filed Weights   03/12/23 1459  Weight: 215 lb 12.8 oz (97.9 kg)    Physical Exam Constitutional:      General: She is not in acute distress. HENT:     Head: Normocephalic and atraumatic.  Eyes:     General: No scleral icterus. Cardiovascular:     Rate and Rhythm: Normal rate and regular rhythm.     Heart sounds: Normal heart sounds.  Pulmonary:     Effort: Pulmonary effort is normal. No respiratory distress.     Breath sounds: No wheezing.  Abdominal:     General: Bowel sounds are normal. There is no distension.     Palpations: Abdomen is soft.  Musculoskeletal:        General: Normal range of motion.     Cervical back: Normal range of motion and neck supple.  Skin:    General: Skin is warm and dry.     Findings: No erythema or rash.  Neurological:     Mental Status: She is alert and oriented to person, place, and time. Mental  status is at baseline.     Cranial Nerves: No cranial nerve deficit.     Coordination: Coordination normal.  Psychiatric:        Mood and Affect: Mood normal.     LABORATORY DATA:  I have reviewed the data as listedIron/TIBC/Ferritin/ %Sat    Component Value Date/Time   IRON 91 03/10/2023 1156   TIBC 382 03/10/2023 1156   FERRITIN 50 03/10/2023 1156   IRONPCTSAT 24 03/10/2023 1156      RADIOGRAPHIC STUDIES: I have personally reviewed the radiological images as listed and agreed with the findings in the report. No results found.

## 2023-03-12 NOTE — Assessment & Plan Note (Addendum)
Iron deficiency anemia, in the setting of gastric bypass. Labs are reviewed and discussed with patient. Lab Results  Component Value Date   HGB 13.0 03/10/2023   TIBC 382 03/10/2023   IRONPCTSAT 24 03/10/2023   FERRITIN 50 03/10/2023    Hold off IV Venofer Follow up in 6 months.

## 2023-03-12 NOTE — Assessment & Plan Note (Addendum)
B12 injection today.  She can decrease sublingual B12 to 2-3 time per week.

## 2023-04-19 IMAGING — RF DG UGI W/ HIGH DENSITY W/O KUB
12 of 14 series · 14 of 24 positions shown · non-contrast
Comparison: None.

CLINICAL DATA: Morbid obesity

EXAM:
UPPER GI SERIES WITH KUB
TECHNIQUE: After obtaining a scout radiograph a routine upper GI series was
performed using thin and high density barium.
FLUOROSCOPY TIME:  Fluoroscopy Time:  3.2 minute
Radiation Exposure Index (if provided by the fluoroscopic device):
71 mGy
Number of Acquired Spot Images: 0

[Series 1: t abdomen supine · 0.14mm/px · 1 of 1 slices shown]
[im 1/1]
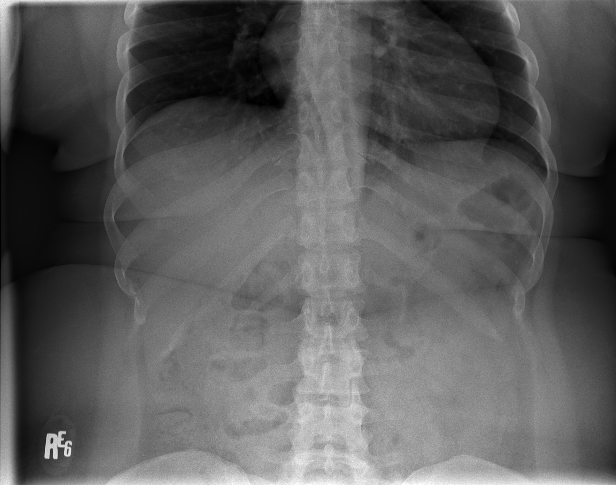

[Series 3: cp_standard · 0.26mm/px · 1 of 93 frames shown (1 of 8)]
[frame 47/93]
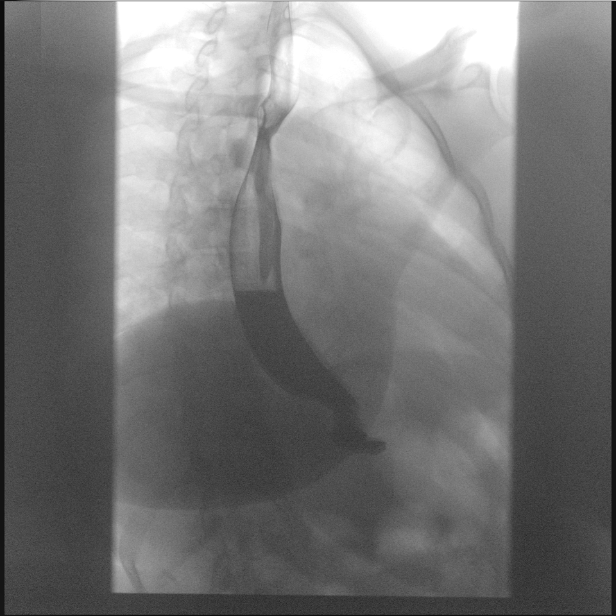

[Series 4: cp_standard · 0.26mm/px · 1 of 95 frames shown (2 of 8)]
[frame 19/95]
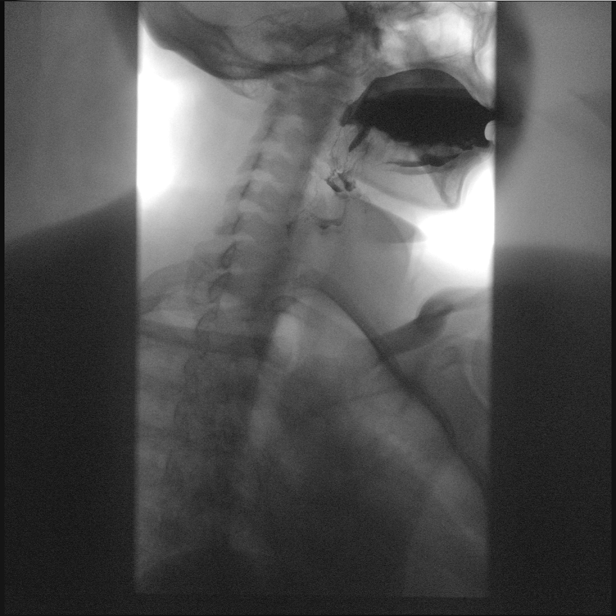

[Series 6: cp_standard · 0.26mm/px · 2 of 34 frames shown (3 of 8)]
[frame 6/34]
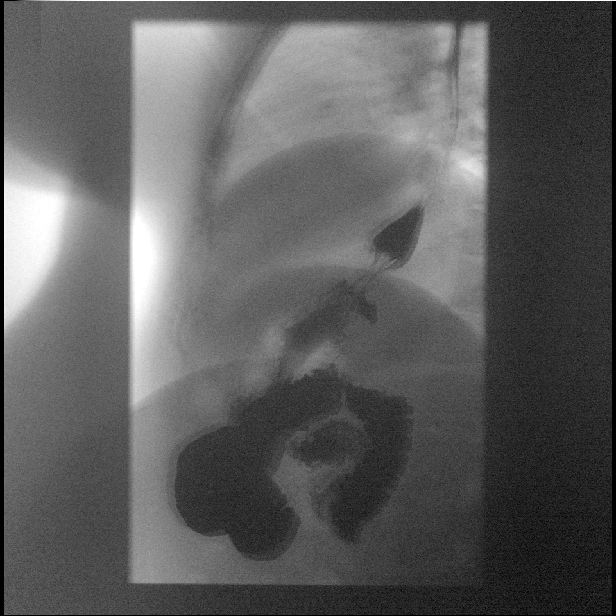
[frame 29/34]
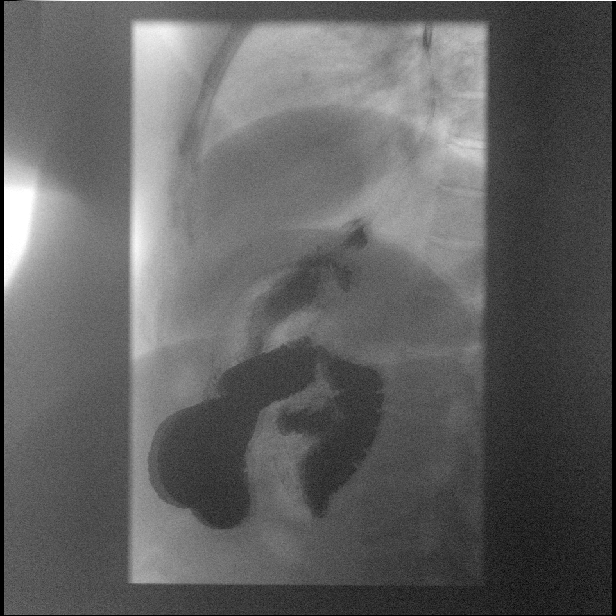

[Series 9: fluoro_barium 2fps_bw · 0.17mm/px · 1 of 1 slices shown (1 of 3)]
[im 1/1]
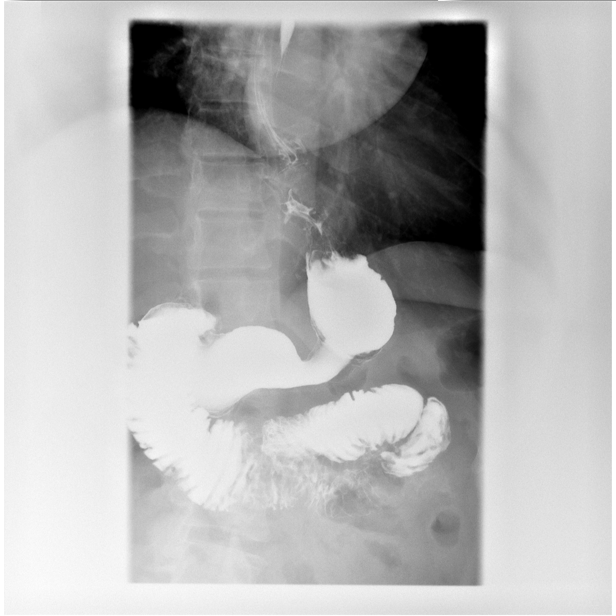

[Series 13: fluoro_barium 2fps_bw · 0.17mm/px · 1 of 1 slices shown (2 of 3)]
[im 1/1]
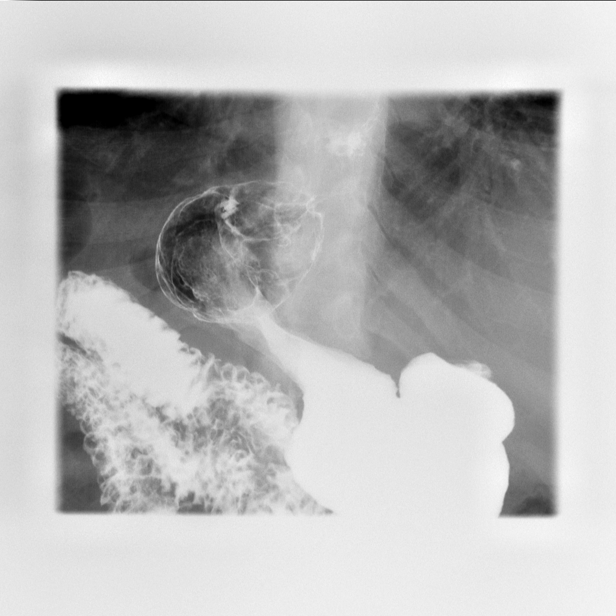

[Series 14: fluoro_barium 2fps_bw · 0.17mm/px · 1 of 1 slices shown (3 of 3)]
[im 1/1]
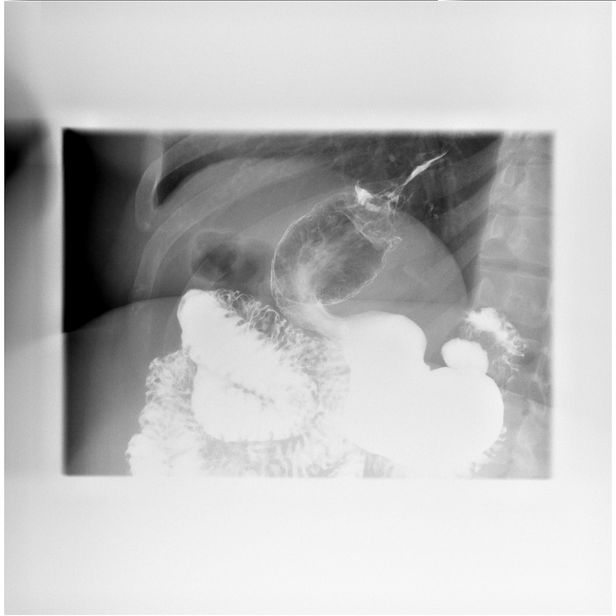

[Series 15: cp_standard · 0.26mm/px · 1 of 216 frames shown (4 of 8)]
[frame 192/216]
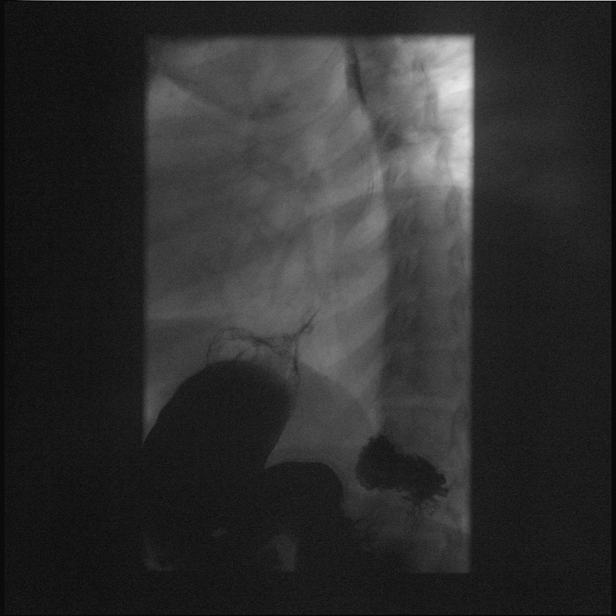

[Series 16: cp_standard · 0.27mm/px · 1 of 68 frames shown (5 of 8)]
[frame 58/68]
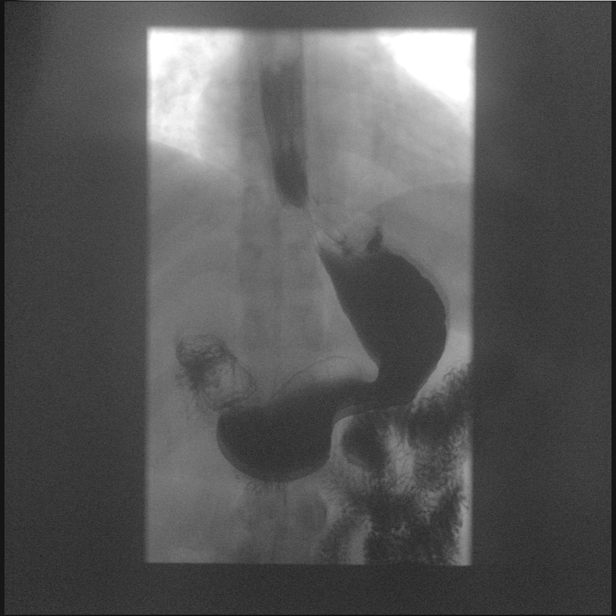

[Series 18: cp_standard · 0.17mm/px · 1 of 41 frames shown (6 of 8)]
[frame 21/41]
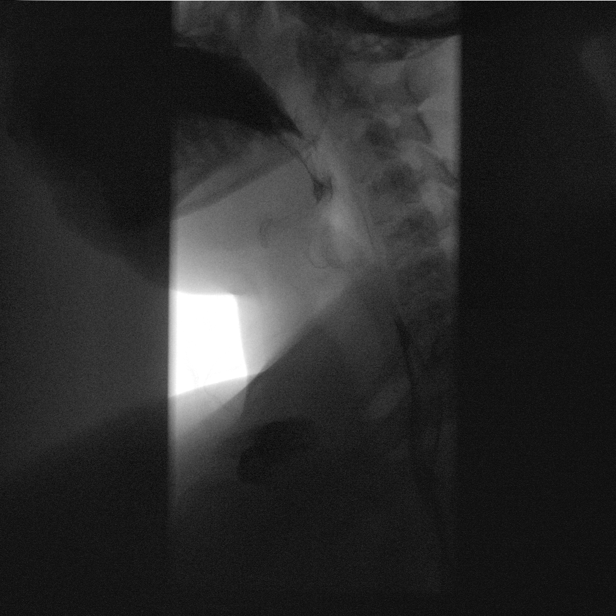

[Series 19: cp_standard · 0.17mm/px · 2 of 53 frames shown (7 of 8)]
[frame 8/53]
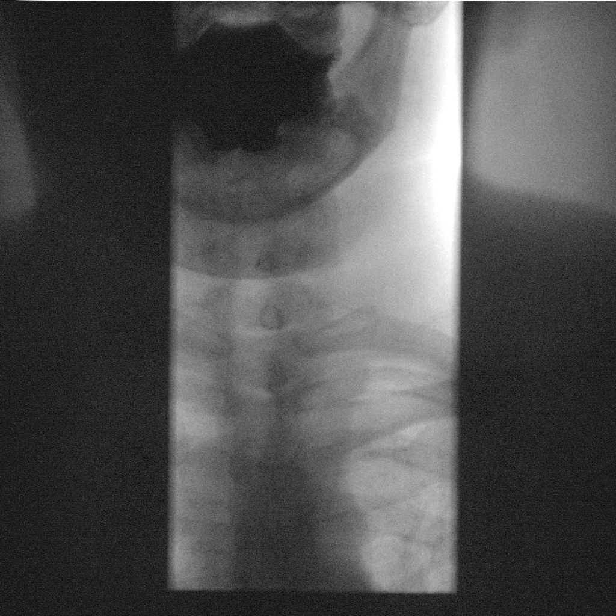
[frame 49/53]
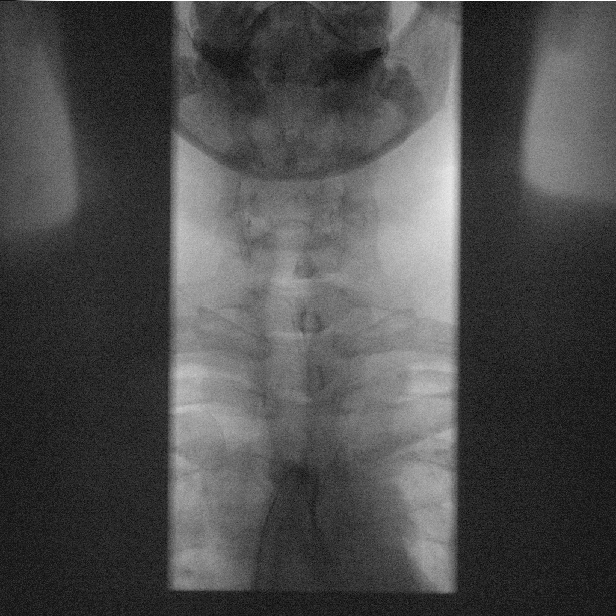

[Series 20: cp_standard · 0.25mm/px · 1 of 50 frames shown (8 of 8)]
[frame 46/50]
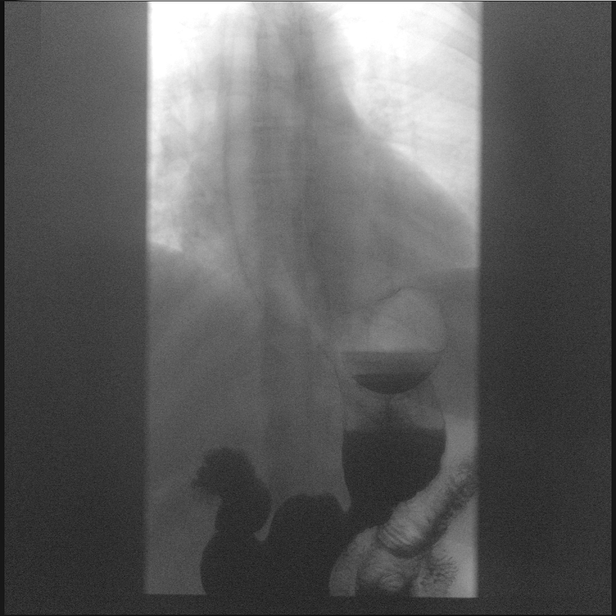

[14 of 24 positions shown; findings below may reference images not displayed]

FINDINGS: KUB:

No bowel dilatation to suggest obstruction. No evidence of
pneumoperitoneum, portal venous gas or pneumatosis.

No pathologic calcifications along the expected course of the
ureters.

No acute osseous abnormality.

UPPER GI SERIES:

Examination of the esophagus demonstrated normal esophageal
motility. Normal esophageal morphology without evidence of
esophagitis or ulceration. No esophageal stricture, diverticula, or
mass lesion. Small hiatal hernia. No spontaneous or inducible
gastroesophageal reflux.

Postsurgical changes of the stomach from prior gastric sleeve
procedure. Examination of the residual stomach demonstrated normal
rugal folds and areae gastricae. Gastric mucosa appeared
unremarkable without evidence of ulceration, scarring, or mass
lesion. Gastric motility and emptying was normal. Relative
outpouching of the cardia of the stomach likely related to
postsurgical changes. Fluoroscopic examination of the duodenum
demonstrates normal motility and morphology without evidence of
ulceration or mass lesion.
IMPRESSION: 1. Postsurgical changes of the stomach from prior gastric sleeve
procedure. Relative outpouching of the cardia of the stomach likely
related to postsurgical changes.
2. No focal abnormality of the stomach.

## 2023-06-06 ENCOUNTER — Encounter (HOSPITAL_COMMUNITY): Payer: Self-pay | Admitting: *Deleted

## 2023-09-01 ENCOUNTER — Encounter: Payer: Self-pay | Admitting: Oncology

## 2023-09-02 ENCOUNTER — Other Ambulatory Visit (HOSPITAL_COMMUNITY)
Admission: RE | Admit: 2023-09-02 | Discharge: 2023-09-02 | Disposition: A | Discharge status: Home / Self Care | Payer: BC Managed Care – PPO | Source: Ambulatory Visit | Attending: Obstetrics and Gynecology | Admitting: Obstetrics and Gynecology

## 2023-09-02 ENCOUNTER — Ambulatory Visit: Payer: Self-pay

## 2023-09-02 VITALS — BP 112/73 | HR 103 | Ht 61.0 in | Wt 234.0 lb

## 2023-09-02 DIAGNOSIS — N898 Other specified noninflammatory disorders of vagina: Secondary | ICD-10-CM | POA: Insufficient documentation

## 2023-09-02 MED ORDER — FLUCONAZOLE 150 MG PO TABS: 150.0000 mg | ORAL_TABLET | Freq: Once | ORAL | 1 refills | Status: AC

## 2023-09-02 NOTE — Patient Instructions (Signed)

## 2023-09-02 NOTE — Progress Notes (Signed)
    NURSE VISIT NOTE  Subjective:    Patient ID: Nancy Owen, female    DOB: 09-10-1985, 38 y.o.   MRN: [id number]  HPI  Patient is a 38 y.o. G29P1011 female who presents for  vaginal itching for 3 day(s). Pt is pretty sure it feels like a yeast infection. Denies abnormal vaginal bleeding or significant pelvic pain or fever. reports urinary frequency. Patient has history of known exposure to STD.   Objective:    BP 112/73   Pulse (!) 103   Ht 5\' 1"  (1.549 m)   Wt 234 lb (106.1 kg)   LMP 08/18/2023   BMI 44.21 kg/m      Assessment:   1. Vaginal itching       Plan:   GC and chlamydia DNA yeast and BV  probe sent to lab. Treatment: abstain from coitus during course of treatment and diflucan sent in to pharmacy ROV prn if symptoms persist or worsen.   Cornelius Moras, CMA

## 2023-09-04 LAB — CERVICOVAGINAL ANCILLARY ONLY
Bacterial Vaginitis (gardnerella): NEGATIVE
Candida Glabrata: NEGATIVE
Candida Vaginitis: POSITIVE — AB
Chlamydia: NEGATIVE
Comment: NEGATIVE
Comment: NEGATIVE
Comment: NEGATIVE
Comment: NEGATIVE
Comment: NEGATIVE
Comment: NORMAL
Neisseria Gonorrhea: NEGATIVE
Trichomonas: NEGATIVE

## 2023-09-08 ENCOUNTER — Telehealth: Payer: Self-pay | Admitting: Oncology

## 2023-09-08 ENCOUNTER — Inpatient Hospital Stay: Payer: BC Managed Care – PPO

## 2023-09-08 NOTE — Telephone Encounter (Signed)
 This patient left msg with out=r answering service to cancel today's lab appt @ 11:45 am and tomorrow's MD/ INf appt. Please call [phone]. Team was notified.

## 2023-09-09 ENCOUNTER — Inpatient Hospital Stay: Payer: BC Managed Care – PPO | Admitting: Oncology

## 2023-09-09 ENCOUNTER — Inpatient Hospital Stay: Payer: BC Managed Care – PPO

## 2023-09-29 ENCOUNTER — Inpatient Hospital Stay: Payer: BC Managed Care – PPO | Attending: Oncology

## 2023-09-29 DIAGNOSIS — E538 Deficiency of other specified B group vitamins: Secondary | ICD-10-CM | POA: Diagnosis present

## 2023-09-29 DIAGNOSIS — Z9884 Bariatric surgery status: Secondary | ICD-10-CM | POA: Diagnosis present

## 2023-09-29 DIAGNOSIS — D508 Other iron deficiency anemias: Secondary | ICD-10-CM | POA: Diagnosis present

## 2023-09-29 LAB — FERRITIN: Ferritin: 7 ng/mL — ABNORMAL LOW (ref 11–307)

## 2023-09-29 LAB — CBC WITH DIFFERENTIAL (CANCER CENTER ONLY)
Abs Immature Granulocytes: 0.15 10*3/uL — ABNORMAL HIGH (ref 0.00–0.07)
Basophils Absolute: 0.1 10*3/uL (ref 0.0–0.1)
Basophils Relative: 1 %
Eosinophils Absolute: 0.2 10*3/uL (ref 0.0–0.5)
Eosinophils Relative: 1 %
HCT: 36.9 % (ref 36.0–46.0)
Hemoglobin: 11.4 g/dL — ABNORMAL LOW (ref 12.0–15.0)
Immature Granulocytes: 1 %
Lymphocytes Relative: 28 %
Lymphs Abs: 3.3 10*3/uL (ref 0.7–4.0)
MCH: 26.6 pg (ref 26.0–34.0)
MCHC: 30.9 g/dL (ref 30.0–36.0)
MCV: 86 fL (ref 80.0–100.0)
Monocytes Absolute: 0.9 10*3/uL (ref 0.1–1.0)
Monocytes Relative: 7 %
Neutro Abs: 7.3 10*3/uL (ref 1.7–7.7)
Neutrophils Relative %: 62 %
Platelet Count: 368 10*3/uL (ref 150–400)
RBC: 4.29 MIL/uL (ref 3.87–5.11)
RDW: 13.8 % (ref 11.5–15.5)
WBC Count: 11.9 10*3/uL — ABNORMAL HIGH (ref 4.0–10.5)
nRBC: 0 % (ref 0.0–0.2)

## 2023-09-29 LAB — RETIC PANEL
Immature Retic Fract: 15.2 % (ref 2.3–15.9)
RBC.: 4.16 MIL/uL (ref 3.87–5.11)
Retic Count, Absolute: 67.8 10*3/uL (ref 19.0–186.0)
Retic Ct Pct: 1.6 % (ref 0.4–3.1)
Reticulocyte Hemoglobin: 30 pg (ref >27.9)

## 2023-09-29 LAB — IRON AND TIBC
Iron: 35 ug/dL (ref 28–170)
Saturation Ratios: 7 % — ABNORMAL LOW (ref 10.4–31.8)
TIBC: 528 ug/dL — ABNORMAL HIGH (ref 250–450)
UIBC: 493 ug/dL

## 2023-10-01 ENCOUNTER — Encounter: Payer: Self-pay | Admitting: Oncology

## 2023-10-01 ENCOUNTER — Inpatient Hospital Stay (HOSPITAL_BASED_OUTPATIENT_CLINIC_OR_DEPARTMENT_OTHER): Payer: BC Managed Care – PPO | Admitting: Oncology

## 2023-10-01 ENCOUNTER — Inpatient Hospital Stay: Payer: BC Managed Care – PPO

## 2023-10-01 VITALS — BP 116/83 | HR 88 | Temp 98.9°F | Resp 18 | Wt 235.2 lb

## 2023-10-01 VITALS — BP 117/95 | HR 90 | Temp 99.3°F | Resp 18

## 2023-10-01 DIAGNOSIS — E538 Deficiency of other specified B group vitamins: Secondary | ICD-10-CM

## 2023-10-01 DIAGNOSIS — D508 Other iron deficiency anemias: Secondary | ICD-10-CM

## 2023-10-01 DIAGNOSIS — K9589 Other complications of other bariatric procedure: Secondary | ICD-10-CM

## 2023-10-01 MED ORDER — SODIUM CHLORIDE 0.9% FLUSH
10.0000 mL | Freq: Once | INTRAVENOUS | Status: AC | PRN
  Administered 2023-10-01: 10 mL
  Filled 2023-10-01: qty 10

## 2023-10-01 MED ORDER — IRON SUCROSE 20 MG/ML IV SOLN
200.0000 mg | Freq: Once | INTRAVENOUS | Status: AC
  Administered 2023-10-01: 200 mg via INTRAVENOUS

## 2023-10-01 MED ORDER — CYANOCOBALAMIN 1000 MCG/ML IJ SOLN
1000.0000 ug | Freq: Once | INTRAMUSCULAR | Status: AC
  Administered 2023-10-01: 1000 ug via INTRAMUSCULAR
  Filled 2023-10-01: qty 1

## 2023-10-01 NOTE — Assessment & Plan Note (Addendum)
 Iron deficiency anemia, in the setting of gastric bypass. Labs are reviewed and discussed with patient. Lab Results  Component Value Date   HGB 11.4 (L) 09/29/2023   TIBC 528 (H) 09/29/2023   IRONPCTSAT 7 (L) 09/29/2023   FERRITIN 7 (L) 09/29/2023    Recommend IV venofer weekly x 4 Recommend gyn evaluation.

## 2023-10-01 NOTE — Progress Notes (Signed)
 Hematology/Oncology Progress note [phone]) [phone] Fax:(336) [phone]     CHIEF COMPLAINTS/REASON FOR VISIT:  Iron deficiency anemia and gastric bypass  ASSESSMENT & PLAN:   Iron deficiency anemia following bariatric surgery Iron deficiency anemia, in the setting of gastric bypass. Labs are reviewed and discussed with patient. Lab Results  Component Value Date   HGB 11.4 (L) 09/29/2023   TIBC 528 (H) 09/29/2023   IRONPCTSAT 7 (L) 09/29/2023   FERRITIN 7 (L) 09/29/2023    Recommend IV venofer weekly x 4   Low serum vitamin B12 B12 injection today.  continue sublingual B12 to 2-3 time per week.    Orders Placed This Encounter  Procedures   CBC with Differential (Cancer Center Only)    Standing Status:   Future    Expected Date:   01/31/2024    Expiration Date:   09/30/2024   Ferritin    Standing Status:   Future    Expected Date:   01/31/2024    Expiration Date:   09/30/2024   Iron and TIBC    Standing Status:   Future    Expected Date:   01/31/2024    Expiration Date:   09/30/2024   Vitamin B12    Standing Status:   Future    Expected Date:   01/31/2024    Expiration Date:   09/30/2024   Retic Panel    Standing Status:   Future    Expected Date:   01/31/2024    Expiration Date:   09/30/2024   Follow up in 6 months.  All questions were answered. The patient knows to call the clinic with any problems, questions or concerns.  Rickard Patience, MD, PhD [facility] Hematology Oncology 10/01/2023   HISTORY OF PRESENTING ILLNESS:   Nancy Owen is a  38 y.o.  female with PMH listed below was seen in consultation at the request of  Gweneth Dimitri, MD  for evaluation of anemia  10/30/2021, status post gastric bypass by Dr. Daphine Deutscher At [facility]. 11/01/2021, hemoglobin 7.6.  MCV 70.8,WBC 13.4, platelet count 365. 11/21/2021, patient had iron panel done which showed Iron saturation 4, folate 11.5  Patient was referred to establish care for management of anemia. +fatigue.   Denies nausea vomiting abdominal pain, Denies black or bloody stool.  INTERVAL HISTORY Nancy Owen is a 38 y.o. female who has above history reviewed by me today presents for follow up visit for management of IDA She tolerates venofer treatments. Fatigue, chronic, unchanged.  + fatigue. Heavy menstrual bleeding.  She currently not taking sublingual B12    Review of Systems  Constitutional:  Positive for fatigue. Negative for appetite change, chills and fever.  HENT:   Negative for hearing loss and voice change.   Eyes:  Negative for eye problems.  Respiratory:  Negative for chest tightness and cough.   Cardiovascular:  Negative for chest pain.  Gastrointestinal:  Negative for abdominal distention, abdominal pain and blood in stool.  Endocrine: Negative for hot flashes.  Genitourinary:  Positive for menstrual problem. Negative for difficulty urinating and frequency.   Musculoskeletal:  Negative for arthralgias.  Skin:  Negative for itching and rash.  Neurological:  Negative for extremity weakness.  Hematological:  Negative for adenopathy.  Psychiatric/Behavioral:  Negative for confusion.     MEDICAL HISTORY:  Past Medical History:  Diagnosis Date   Allergic rhinitis    Encounter for insertion of Mirena IUD    GERD (gastroesophageal reflux disease)    Gonorrhea  Headache    Herpes genitalia    High-risk pregnancy 01/17/2014   chorio/mild utering atony   History of hiatal hernia    History of syphilis    Obesity    Sleep apnea    awaitin new one   Syphilis     SURGICAL HISTORY: Past Surgical History:  Procedure Laterality Date   BREAST REDUCTION SURGERY Bilateral 11/02/2019   Procedure: MAMMARY REDUCTION  (BREAST);  Surgeon: Allena Napoleon, MD;  Location: [facility];  Service: Plastics;  Laterality: Bilateral;  3 hours   CHOLECYSTECTOMY     10/2020   FRACTURE SURGERY  2004   L left tibia   GASTRIC ROUX-EN-Y N/A 10/30/2021   Procedure:  LAPAROSCOPIC ROUX-EN-Y GASTRIC BYPASS WITH UPPER ENDOSCOPY, conversion from sleeve;  Surgeon: Luretha Murphy, MD;  Location: [facility];  Service: General;  Laterality: N/A;   LAPAROSCOPIC GASTRIC SLEEVE RESECTION N/A 07/25/2015   Procedure: LAPAROSCOPIC GASTRIC SLEEVE RESECTION;  Surgeon: Glenna Fellows, MD;  Location: [facility];  Service: General;  Laterality: N/A;   LAPAROSCOPIC GASTRIC SLEEVE RESECTION      SOCIAL HISTORY: Social History   Socioeconomic History   Marital status: Married    Spouse name: Glass blower/designer   Number of children: 1   Years of education: college   Highest education level: Not on file  Occupational History   Occupation: AR specialist- IT trainer: [employer]    Comment: [employer]  Tobacco Use   Smoking status: Never   Smokeless tobacco: Never  Vaping Use   Vaping status: Never Used  Substance and Sexual Activity   Alcohol use: Not Currently   Drug use: No   Sexual activity: Yes    Birth control/protection: Inserts    Comment: Nuvaring  Other Topics Concern   Not on file  Social History Narrative   09/09/19   From: [place] - her mom moved her after [employer] finished college   Living: with husband Glass blower/designer and daughter Hessie Diener   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, [employer]      Family: mom nearby      Enjoys: reading, spend time with daughter - swimming, dancing      Exercise: not currently, planning to start exercise plan with sister   Diet: intermittent fasting       Safety   Seat belts: Yes    Guns: No   Safe in relationships: Yes    Social Drivers of Corporate investment banker Strain: Not on file  Food Insecurity: Not on file  Transportation Needs: Not on file  Physical Activity: Not on file  Stress: Not on file  Social Connections: Not on file  Intimate Partner Violence: Not on file    FAMILY HISTORY: Family History  Problem Relation Age of Onset   Hypertension Father    Hypertension Maternal Grandmother    Diabetes Maternal  Grandfather    Hypertension Maternal Grandfather    Breast cancer Other        30s/40s   Other Cousin        breast augmentation 2/2 to pain    ALLERGIES:  has no known allergies.  MEDICATIONS:  Current Outpatient Medications  Medication Sig Dispense Refill   acetaminophen (TYLENOL) 500 MG tablet Take 500 mg by mouth every 6 (six) hours as needed for mild pain.     Cyanocobalamin (B-12) 2500 MCG SUBL Place 1 tablet under the tongue daily at 2 PM. 90 tablet 1   Difluprednate 0.05 % EMUL  Apply 1 drop to eye 3 (three) times daily.     etonogestrel-ethinyl estradiol (NUVARING) 0.12-0.015 MG/24HR vaginal ring Insert vaginally and leave in place for 3 consecutive weeks, then remove for 1 week. 3 each 3   gatifloxacin (ZYMAXID) 0.5 % SOLN 1 drop 3 (three) times daily.     loratadine (CLARITIN) 10 MG tablet Take 10 mg by mouth daily as needed for allergies.     phentermine (ADIPEX-P) 37.5 MG tablet Take 1 tablet (37.5 mg total) by mouth daily before breakfast. 30 tablet 0   valACYclovir (VALTREX) 500 MG tablet Take 1 tablet (500 mg total) by mouth 2 (two) times daily. For 3 days prn outbreak 30 tablet 1   No current facility-administered medications for this visit.     PHYSICAL EXAMINATION:  Vitals:   10/01/23 1449  BP: 116/83  Pulse: 88  Resp: 18  Temp: 98.9 F (37.2 C)  SpO2: 100%   Filed Weights   10/01/23 1449  Weight: 235 lb 3.2 oz (106.7 kg)    Physical Exam Constitutional:      General: She is not in acute distress. HENT:     Head: Normocephalic and atraumatic.  Eyes:     General: No scleral icterus. Cardiovascular:     Rate and Rhythm: Normal rate and regular rhythm.     Heart sounds: Normal heart sounds.  Pulmonary:     Effort: Pulmonary effort is normal. No respiratory distress.     Breath sounds: No wheezing.  Abdominal:     General: Bowel sounds are normal.     Palpations: Abdomen is soft.  Musculoskeletal:        General: Normal range of motion.      Cervical back: Normal range of motion and neck supple.  Skin:    General: Skin is warm and dry.     Findings: No erythema or rash.  Neurological:     Mental Status: She is alert and oriented to person, place, and time. Mental status is at baseline.  Psychiatric:        Mood and Affect: Mood normal.     LABORATORY DATA:  I have reviewed the data as listedIron/TIBC/Ferritin/ %Sat    Component Value Date/Time   IRON 35 09/29/2023 1521   TIBC 528 (H) 09/29/2023 1521   FERRITIN 7 (L) 09/29/2023 1521   IRONPCTSAT 7 (L) 09/29/2023 1521      RADIOGRAPHIC STUDIES: I have personally reviewed the radiological images as listed and agreed with the findings in the report. No results found.

## 2023-10-01 NOTE — Assessment & Plan Note (Signed)
 B12 injection today.  continue sublingual B12 to 2-3 time per week.

## 2023-10-08 ENCOUNTER — Inpatient Hospital Stay

## 2023-10-08 VITALS — BP 111/72 | HR 90 | Temp 98.1°F | Resp 18

## 2023-10-08 DIAGNOSIS — D508 Other iron deficiency anemias: Secondary | ICD-10-CM

## 2023-10-08 MED ORDER — IRON SUCROSE 20 MG/ML IV SOLN
200.0000 mg | Freq: Once | INTRAVENOUS | Status: AC
  Administered 2023-10-08: 200 mg via INTRAVENOUS

## 2023-10-08 MED ORDER — SODIUM CHLORIDE 0.9% FLUSH
10.0000 mL | Freq: Once | INTRAVENOUS | Status: AC | PRN
  Administered 2023-10-08: 10 mL
  Filled 2023-10-08: qty 10

## 2023-10-15 ENCOUNTER — Inpatient Hospital Stay

## 2023-10-16 ENCOUNTER — Other Ambulatory Visit: Payer: Self-pay

## 2023-10-16 DIAGNOSIS — K9589 Other complications of other bariatric procedure: Secondary | ICD-10-CM

## 2023-10-16 DIAGNOSIS — E538 Deficiency of other specified B group vitamins: Secondary | ICD-10-CM

## 2023-10-17 ENCOUNTER — Inpatient Hospital Stay: Attending: Oncology

## 2023-10-17 ENCOUNTER — Other Ambulatory Visit: Payer: Self-pay

## 2023-10-17 VITALS — BP 119/72 | HR 95 | Temp 99.2°F | Resp 16

## 2023-10-17 DIAGNOSIS — E538 Deficiency of other specified B group vitamins: Secondary | ICD-10-CM | POA: Diagnosis present

## 2023-10-17 DIAGNOSIS — Z9884 Bariatric surgery status: Secondary | ICD-10-CM | POA: Insufficient documentation

## 2023-10-17 DIAGNOSIS — K9589 Other complications of other bariatric procedure: Secondary | ICD-10-CM

## 2023-10-17 DIAGNOSIS — D508 Other iron deficiency anemias: Secondary | ICD-10-CM | POA: Diagnosis present

## 2023-10-17 MED ORDER — IRON SUCROSE 20 MG/ML IV SOLN
200.0000 mg | Freq: Once | INTRAVENOUS | Status: AC
Start: 2023-10-17 — End: 2023-10-17
  Administered 2023-10-17: 200 mg via INTRAVENOUS

## 2023-10-17 NOTE — Patient Instructions (Signed)

## 2023-10-22 ENCOUNTER — Inpatient Hospital Stay

## 2023-10-22 VITALS — BP 119/94 | HR 82 | Temp 96.8°F | Resp 18

## 2023-10-22 DIAGNOSIS — E538 Deficiency of other specified B group vitamins: Secondary | ICD-10-CM | POA: Diagnosis not present

## 2023-10-22 DIAGNOSIS — K9589 Other complications of other bariatric procedure: Secondary | ICD-10-CM

## 2023-10-22 LAB — VITAMIN B12: Vitamin B-12: 391 pg/mL (ref 180–914)

## 2023-10-22 MED ORDER — IRON SUCROSE 20 MG/ML IV SOLN
200.0000 mg | Freq: Once | INTRAVENOUS | Status: AC
  Administered 2023-10-22: 200 mg via INTRAVENOUS

## 2023-10-22 NOTE — Patient Instructions (Signed)

## 2024-02-02 ENCOUNTER — Inpatient Hospital Stay: Attending: Oncology

## 2024-02-02 DIAGNOSIS — D508 Other iron deficiency anemias: Secondary | ICD-10-CM | POA: Diagnosis present

## 2024-02-02 DIAGNOSIS — Z9884 Bariatric surgery status: Secondary | ICD-10-CM | POA: Diagnosis not present

## 2024-02-02 LAB — CBC WITH DIFFERENTIAL (CANCER CENTER ONLY)
Abs Immature Granulocytes: 0.02 K/uL (ref 0.00–0.07)
Basophils Absolute: 0 K/uL (ref 0.0–0.1)
Basophils Relative: 0 %
Eosinophils Absolute: 0.2 K/uL (ref 0.0–0.5)
Eosinophils Relative: 2 %
HCT: 38 % (ref 36.0–46.0)
Hemoglobin: 12.2 g/dL (ref 12.0–15.0)
Immature Granulocytes: 0 %
Lymphocytes Relative: 29 %
Lymphs Abs: 3.3 K/uL (ref 0.7–4.0)
MCH: 28.3 pg (ref 26.0–34.0)
MCHC: 32.1 g/dL (ref 30.0–36.0)
MCV: 88.2 fL (ref 80.0–100.0)
Monocytes Absolute: 0.9 K/uL (ref 0.1–1.0)
Monocytes Relative: 7 %
Neutro Abs: 7 K/uL (ref 1.7–7.7)
Neutrophils Relative %: 62 %
Platelet Count: 342 K/uL (ref 150–400)
RBC: 4.31 MIL/uL (ref 3.87–5.11)
RDW: 13.4 % (ref 11.5–15.5)
WBC Count: 11.4 K/uL — ABNORMAL HIGH (ref 4.0–10.5)
nRBC: 0 % (ref 0.0–0.2)

## 2024-02-02 LAB — RETIC PANEL
Immature Retic Fract: 9.1 % (ref 2.3–15.9)
RBC.: 4.33 MIL/uL (ref 3.87–5.11)
Retic Count, Absolute: 71.4 K/uL (ref 19.0–186.0)
Retic Ct Pct: 1.7 % (ref 0.4–3.1)
Reticulocyte Hemoglobin: 30.9 pg (ref >27.9)

## 2024-02-02 LAB — IRON AND TIBC
Iron: 43 ug/dL (ref 28–170)
Saturation Ratios: 10 % — ABNORMAL LOW (ref 10.4–31.8)
TIBC: 433 ug/dL (ref 250–450)
UIBC: 390 ug/dL

## 2024-02-02 LAB — VITAMIN B12: Vitamin B-12: 356 pg/mL (ref 180–914)

## 2024-02-02 LAB — FERRITIN: Ferritin: 25 ng/mL (ref 11–307)

## 2024-02-04 ENCOUNTER — Encounter: Payer: Self-pay | Admitting: Oncology

## 2024-02-04 ENCOUNTER — Inpatient Hospital Stay

## 2024-02-04 ENCOUNTER — Inpatient Hospital Stay (HOSPITAL_BASED_OUTPATIENT_CLINIC_OR_DEPARTMENT_OTHER): Admitting: Oncology

## 2024-02-04 VITALS — BP 120/68 | HR 90 | Resp 16

## 2024-02-04 VITALS — BP 122/74 | HR 94 | Temp 98.6°F | Resp 18 | Ht 61.0 in | Wt 244.0 lb

## 2024-02-04 DIAGNOSIS — D508 Other iron deficiency anemias: Secondary | ICD-10-CM | POA: Diagnosis not present

## 2024-02-04 DIAGNOSIS — K9589 Other complications of other bariatric procedure: Secondary | ICD-10-CM | POA: Diagnosis not present

## 2024-02-04 DIAGNOSIS — E538 Deficiency of other specified B group vitamins: Secondary | ICD-10-CM | POA: Diagnosis not present

## 2024-02-04 MED ORDER — IRON SUCROSE 20 MG/ML IV SOLN
200.0000 mg | Freq: Once | INTRAVENOUS | Status: AC
Start: 2024-02-04 — End: 2024-02-04
  Administered 2024-02-04: 200 mg via INTRAVENOUS
  Filled 2024-02-04: qty 10

## 2024-02-04 MED ORDER — CYANOCOBALAMIN 1000 MCG/ML IJ SOLN
1000.0000 ug | Freq: Once | INTRAMUSCULAR | Status: AC
  Administered 2024-02-04: 1000 ug via INTRAMUSCULAR
  Filled 2024-02-04: qty 1

## 2024-02-04 NOTE — Assessment & Plan Note (Addendum)
 Iron  deficiency anemia, in the setting of gastric bypass. Labs are reviewed and discussed with patient. Lab Results  Component Value Date   HGB 12.2 02/02/2024   TIBC 433 02/02/2024   IRONPCTSAT 10 (L) 02/02/2024   FERRITIN 25 02/02/2024    Recommend IV venofer  200mg  x 1 Recommend gyn evaluation.

## 2024-02-04 NOTE — Progress Notes (Signed)
 Hematology/Oncology Progress note Telephone:(336) [phone] Fax:(336) [phone]     CHIEF COMPLAINTS/REASON FOR VISIT:  Iron  deficiency anemia and gastric bypass  ASSESSMENT & PLAN:   Iron  deficiency anemia following bariatric surgery Iron  deficiency anemia, in the setting of gastric bypass. Labs are reviewed and discussed with patient. Lab Results  Component Value Date   HGB 12.2 02/02/2024   TIBC 433 02/02/2024   IRONPCTSAT 10 (L) 02/02/2024   FERRITIN 25 02/02/2024    Recommend IV venofer  200mg  x 1 Recommend gyn evaluation.   Low serum vitamin B12 B12 injection monthly    Orders Placed This Encounter  Procedures   CBC with Differential (Cancer Center Only)    Standing Status:   Future    Expected Date:   06/06/2024    Expiration Date:   09/04/2024   Ferritin    Standing Status:   Future    Expected Date:   06/06/2024    Expiration Date:   09/04/2024   Iron  and TIBC    Standing Status:   Future    Expected Date:   06/06/2024    Expiration Date:   09/04/2024   Vitamin B12    Standing Status:   Future    Expected Date:   06/06/2024    Expiration Date:   09/04/2024   Retic Panel    Standing Status:   Future    Expected Date:   06/06/2024    Expiration Date:   09/04/2024   Follow up in 4 months.  All questions were answered. The patient knows to call the clinic with any problems, questions or concerns.  Zelphia Cap, MD, PhD [facility] Hematology Oncology 02/04/2024   HISTORY OF PRESENTING ILLNESS:   Nancy Owen is a  38 y.o.  female with PMH listed below was seen in consultation at the request of  Velma Raisin, MD  for evaluation of anemia  10/30/2021, status post gastric bypass by [facility]. Gladis At [facility] 11/01/2021, hemoglobin 7.6.  MCV 70.8,WBC 13.4, platelet count 365. 11/21/2021, patient had iron  panel done which showed Iron  saturation 4, folate 11.5  Patient was referred to establish care for management of anemia. +fatigue.  Denies nausea vomiting abdominal  pain, Denies black or bloody stool.  INTERVAL HISTORY Nancy Owen is a 38 y.o. female who has above history reviewed by me today presents for follow up visit for management of IDA She tolerates venofer  treatments. Fatigue, chronic, unchanged.  + fatigue. Heavy menstrual bleeding.  She currently not taking sublingual B12 2500mcg, on B12 injection monthly.     Review of Systems  Constitutional:  Positive for fatigue. Negative for appetite change, chills and fever.  HENT:   Negative for hearing loss and voice change.   Eyes:  Negative for eye problems.  Respiratory:  Negative for chest tightness and cough.   Cardiovascular:  Negative for chest pain.  Gastrointestinal:  Negative for abdominal distention, abdominal pain and blood in stool.  Endocrine: Negative for hot flashes.  Genitourinary:  Positive for menstrual problem. Negative for difficulty urinating and frequency.   Musculoskeletal:  Negative for arthralgias.  Skin:  Negative for itching and rash.  Neurological:  Negative for extremity weakness.  Hematological:  Negative for adenopathy.  Psychiatric/Behavioral:  Negative for confusion.     MEDICAL HISTORY:  Past Medical History:  Diagnosis Date   Allergic rhinitis    Encounter for insertion of Mirena  IUD    GERD (gastroesophageal reflux disease)    Gonorrhea    Headache  Herpes genitalia    High-risk pregnancy 01/17/2014   chorio/mild utering atony   History of hiatal hernia    History of syphilis    Obesity    Sleep apnea    awaitin new one   Syphilis     SURGICAL HISTORY: Past Surgical History:  Procedure Laterality Date   BREAST REDUCTION SURGERY Bilateral 11/02/2019   Procedure: MAMMARY REDUCTION  (BREAST);  Surgeon: Elisabeth Craig RAMAN, MD;  Location: [facility];  Service: Plastics;  Laterality: Bilateral;  3 hours   CHOLECYSTECTOMY     10/2020   FRACTURE SURGERY  2004   L left tibia   GASTRIC ROUX-EN-Y N/A 10/30/2021   Procedure:  LAPAROSCOPIC ROUX-EN-Y GASTRIC BYPASS WITH UPPER ENDOSCOPY, conversion from sleeve;  Surgeon: Gladis Cough, MD;  Location: [facility];  Service: General;  Laterality: N/A;   LAPAROSCOPIC GASTRIC SLEEVE RESECTION N/A 07/25/2015   Procedure: LAPAROSCOPIC GASTRIC SLEEVE RESECTION;  Surgeon: Morene Olives, MD;  Location: [facility];  Service: General;  Laterality: N/A;   LAPAROSCOPIC GASTRIC SLEEVE RESECTION      SOCIAL HISTORY: Social History   Socioeconomic History   Marital status: Married    Spouse name: Glass blower/designer   Number of children: 1   Years of education: college   Highest education level: Not on file  Occupational History   Occupation: AR specialist- IT trainer: [employer]    Comment: [employer]  Tobacco Use   Smoking status: Never   Smokeless tobacco: Never  Vaping Use   Vaping status: Never Used  Substance and Sexual Activity   Alcohol use: Not Currently   Drug use: No   Sexual activity: Yes    Birth control/protection: Inserts    Comment: Nuvaring  Other Topics Concern   Not on file  Social History Narrative   09/09/19   From: [place]  - her mom moved her after Terriyah finished college   Living: with husband Kevon and daughter Eulogio   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, [employer]      Family: mom nearby      Enjoys: reading, spend time with daughter - swimming, dancing      Exercise: not currently, planning to start exercise plan with sister   Diet: intermittent fasting       Safety   Seat belts: Yes    Guns: No   Safe in relationships: Yes    Social Drivers of Corporate investment banker Strain: Not on file  Food Insecurity: Not on file  Transportation Needs: Not on file  Physical Activity: Not on file  Stress: Not on file  Social Connections: Not on file  Intimate Partner Violence: Not on file    FAMILY HISTORY: Family History  Problem Relation Age of Onset   Hypertension Father    Hypertension Maternal Grandmother    Diabetes Maternal  Grandfather    Hypertension Maternal Grandfather    Breast cancer Other        30s/40s   Other Cousin        breast augmentation 2/2 to pain    ALLERGIES:  has no known allergies.  MEDICATIONS:  Current Outpatient Medications  Medication Sig Dispense Refill   acetaminophen  (TYLENOL ) 500 MG tablet Take 500 mg by mouth every 6 (six) hours as needed for mild pain.     Cyanocobalamin  (B-12) 2500 MCG SUBL Place 1 tablet under the tongue daily at 2 PM. 90 tablet 1   etonogestrel -ethinyl estradiol  (NUVARING) 0.12-0.015 MG/24HR vaginal ring Insert  vaginally and leave in place for 3 consecutive weeks, then remove for 1 week. 3 each 3   loratadine (CLARITIN) 10 MG tablet Take 10 mg by mouth daily as needed for allergies.     phentermine  (ADIPEX-P ) 37.5 MG tablet Take 1 tablet (37.5 mg total) by mouth daily before breakfast. 30 tablet 0   valACYclovir  (VALTREX ) 500 MG tablet Take 1 tablet (500 mg total) by mouth 2 (two) times daily. For 3 days prn outbreak 30 tablet 1   No current facility-administered medications for this visit.     PHYSICAL EXAMINATION:  Vitals:   02/04/24 1509  BP: 122/74  Pulse: 94  Resp: 18  Temp: 98.6 F (37 C)  SpO2: 100%   Filed Weights   02/04/24 1509  Weight: 244 lb (110.7 kg)    Physical Exam Constitutional:      General: She is not in acute distress. HENT:     Head: Normocephalic and atraumatic.  Eyes:     General: No scleral icterus. Cardiovascular:     Rate and Rhythm: Normal rate and regular rhythm.     Heart sounds: Normal heart sounds.  Pulmonary:     Effort: Pulmonary effort is normal. No respiratory distress.     Breath sounds: No wheezing.  Abdominal:     General: Bowel sounds are normal.     Palpations: Abdomen is soft.  Musculoskeletal:        General: Normal range of motion.     Cervical back: Normal range of motion and neck supple.  Skin:    General: Skin is warm and dry.     Findings: No erythema or rash.  Neurological:      Mental Status: She is alert and oriented to person, place, and time. Mental status is at baseline.  Psychiatric:        Mood and Affect: Mood normal.     LABORATORY DATA:  I have reviewed the data as listedIron/TIBC/Ferritin/ %Sat    Component Value Date/Time   IRON  43 02/02/2024 1526   TIBC 433 02/02/2024 1526   FERRITIN 25 02/02/2024 1526   IRONPCTSAT 10 (L) 02/02/2024 1526      RADIOGRAPHIC STUDIES: I have personally reviewed the radiological images as listed and agreed with the findings in the report. No results found.

## 2024-02-04 NOTE — Assessment & Plan Note (Signed)
B12 injection monthly

## 2024-02-04 NOTE — Progress Notes (Signed)
 Patient was on her menstraul cycle last week and noticed how much more fatigued and tired she was, due to her having heavy periods. Her periods getting heavier every time.

## 2024-02-04 NOTE — Progress Notes (Signed)
 Patient refused to stay for 30 minute observation post Venofer . Patient educated to go to ED for any signs of an allergic reaction.

## 2024-03-08 ENCOUNTER — Inpatient Hospital Stay: Attending: Oncology

## 2024-03-08 DIAGNOSIS — E538 Deficiency of other specified B group vitamins: Secondary | ICD-10-CM | POA: Insufficient documentation

## 2024-03-08 DIAGNOSIS — D508 Other iron deficiency anemias: Secondary | ICD-10-CM

## 2024-03-08 MED ORDER — CYANOCOBALAMIN 1000 MCG/ML IJ SOLN
1000.0000 ug | Freq: Once | INTRAMUSCULAR | Status: AC
  Administered 2024-03-08: 1000 ug via INTRAMUSCULAR
  Filled 2024-03-08: qty 1

## 2024-04-08 ENCOUNTER — Inpatient Hospital Stay: Attending: Oncology

## 2024-04-08 DIAGNOSIS — K9589 Other complications of other bariatric procedure: Secondary | ICD-10-CM

## 2024-04-08 DIAGNOSIS — E538 Deficiency of other specified B group vitamins: Secondary | ICD-10-CM | POA: Insufficient documentation

## 2024-04-08 MED ORDER — CYANOCOBALAMIN 1000 MCG/ML IJ SOLN
1000.0000 ug | Freq: Once | INTRAMUSCULAR | Status: AC
  Administered 2024-04-08: 1000 ug via INTRAMUSCULAR
  Filled 2024-04-08: qty 1

## 2024-04-13 ENCOUNTER — Other Ambulatory Visit: Payer: Self-pay | Admitting: Obstetrics and Gynecology

## 2024-04-13 DIAGNOSIS — B3731 Acute candidiasis of vulva and vagina: Secondary | ICD-10-CM

## 2024-04-14 ENCOUNTER — Ambulatory Visit (INDEPENDENT_AMBULATORY_CARE_PROVIDER_SITE_OTHER)

## 2024-04-14 ENCOUNTER — Other Ambulatory Visit (HOSPITAL_COMMUNITY)
Admission: RE | Admit: 2024-04-14 | Discharge: 2024-04-14 | Disposition: A | Discharge status: Home / Self Care | Source: Ambulatory Visit | Attending: Obstetrics | Admitting: Obstetrics

## 2024-04-14 VITALS — BP 120/79 | HR 78 | Ht 61.0 in | Wt 250.5 lb

## 2024-04-14 DIAGNOSIS — N898 Other specified noninflammatory disorders of vagina: Secondary | ICD-10-CM

## 2024-04-14 NOTE — Progress Notes (Signed)
    NURSE VISIT NOTE  Subjective:    Patient ID: Nancy Owen, female    DOB: 02-27-1986, 38 y.o.   MRN: [id number]  HPI  Patient is a 38 y.o. [id number] female who presents for white and creamy vaginal discharge and itching for 3 day(s). Patient denies history of known exposure to STD. Reports taking one dose of Diflucan  with no relief.    Objective:    BP 120/79   Pulse 78   Ht 5' 1 (1.549 m)   Wt 250 lb 8 oz (113.6 kg)   BMI 47.33 kg/m      Assessment:   1. Vaginal itching   2. Vaginal discharge      Plan:   GC and chlamydia DNA  probe sent to lab. Treatment: OTC yeast cream such as Monistat  or Gyne-Lotrimin and await results for further treatment ROV prn if symptoms persist or worsen.   Waddell JONELLE Maxim, CMA

## 2024-04-14 NOTE — Patient Instructions (Signed)

## 2024-04-15 LAB — CERVICOVAGINAL ANCILLARY ONLY
Bacterial Vaginitis (gardnerella): NEGATIVE
Candida Glabrata: NEGATIVE
Candida Vaginitis: POSITIVE — AB
Comment: NEGATIVE
Comment: NEGATIVE
Comment: NEGATIVE

## 2024-04-16 ENCOUNTER — Ambulatory Visit: Payer: Self-pay

## 2024-04-16 ENCOUNTER — Other Ambulatory Visit: Payer: Self-pay

## 2024-04-16 DIAGNOSIS — B379 Candidiasis, unspecified: Secondary | ICD-10-CM

## 2024-04-16 MED ORDER — FLUCONAZOLE 150 MG PO TABS: 150.0000 mg | ORAL_TABLET | Freq: Once | ORAL | 0 refills | Status: AC

## 2024-04-16 NOTE — Progress Notes (Signed)
 Rx for diflucan  to treat yeast sent to patient pharmacy.

## 2024-04-19 ENCOUNTER — Other Ambulatory Visit: Payer: Self-pay

## 2024-04-19 ENCOUNTER — Other Ambulatory Visit: Payer: Self-pay | Admitting: Obstetrics and Gynecology

## 2024-04-19 DIAGNOSIS — B009 Herpesviral infection, unspecified: Secondary | ICD-10-CM

## 2024-04-19 MED ORDER — VALACYCLOVIR HCL 500 MG PO TABS: 500.0000 mg | ORAL_TABLET | Freq: Two times a day (BID) | ORAL | 0 refills | Status: DC

## 2024-05-06 NOTE — Progress Notes (Signed)
 PCP:  Velma Raisin, MD   Chief Complaint  Patient presents with   Gynecologic Exam     HPI:      Ms. Nancy Owen is a 38 y.o. G2P1011 whose LMP was Patient's last menstrual period was 03/26/2024., presents today for her annual examination.  Her menses are regular every 28-30 days, lasting 5-7 days off nuvaring, mod flow, no BTB.  Dysmenorrhea mild, improved with tylenol /heating pad (can't have NSAIDs due to hx of bariatric surg). Skipped 10/25 menses, had monthly menses prior to [facility] past. Has several 5 mm leio per GYN u/s 6/23; hx of IDA after bariatric surgery, doing Fe infustions with hematology. WNL H/H 7/25  Sex activity: single partner, contraception - none/ stopped NuvaRing vaginal inserts 7/25 to conceive. Taking MVI not PNVs. No pain/bleeding/dryness with sex. Last Pap: 09/01/20 Results were: no abnormalities /neg HPV DNA  Hx of STDs: GC, syphilis, HSV 2--takes valtrex  every day now, needs Rx RF  There is a FH of breast cancer in her mat grt aunt, genetic testing not indicated. There is no FH of ovarian cancer. The patient does do self-breast exams.  Tobacco use: The patient denies current or previous tobacco use. Alcohol use: social drinker No drug use.  Exercise: not active  She does get adequate calcium and Vitamin D  in her diet. Labs with PCP.   Hx of frequent yeast vag, treats with diflucan , in past. Sx not as frequent now.   Patient Active Problem List   Diagnosis Date Noted   HSV-2 (herpes simplex virus 2) infection 01/30/2023   Menorrhagia with regular cycle 01/30/2023   Family history of breast cancer 01/30/2023   Low serum vitamin B12 09/11/2022   Hypocalcemia 09/10/2022   Iron  deficiency anemia following bariatric surgery 12/06/2021   S/P gastric bypass 10/30/2021   Symptomatic cholelithiasis    Lymphedema 01/25/2020   Pain in limb 12/21/2019   Swelling of limb 12/21/2019   S/P bilateral breast reduction 11/24/2019   Macromastia 09/09/2019    Upper back pain 09/09/2019   Hx of fracture of tibia 09/09/2019   History of bariatric surgery 05/09/2019   History of syphilis 05/09/2019   Herpes genitalia 05/09/2019   Morbid obesity with body mass index of 40.0-49.9 (HCC) 07/25/2015   Well woman exam 01/03/2015   OSA (obstructive sleep apnea) 08/02/2011   Insomnia 08/02/2011   Obesity 08/02/2011    Past Surgical History:  Procedure Laterality Date   BREAST REDUCTION SURGERY Bilateral 11/02/2019   Procedure: MAMMARY REDUCTION  (BREAST);  Surgeon: Elisabeth Craig RAMAN, MD;  Location: [facility];  Service: Plastics;  Laterality: Bilateral;  3 hours   CHOLECYSTECTOMY     10/2020   FRACTURE SURGERY  2004   L left tibia   GASTRIC ROUX-EN-Y N/A 10/30/2021   Procedure: LAPAROSCOPIC ROUX-EN-Y GASTRIC BYPASS WITH UPPER ENDOSCOPY, conversion from sleeve;  Surgeon: Gladis Cough, MD;  Location: [facility];  Service: General;  Laterality: N/A;   LAPAROSCOPIC GASTRIC SLEEVE RESECTION N/A 07/25/2015   Procedure: LAPAROSCOPIC GASTRIC SLEEVE RESECTION;  Surgeon: Morene Olives, MD;  Location: [facility];  Service: General;  Laterality: N/A;   LAPAROSCOPIC GASTRIC SLEEVE RESECTION      Family History  Problem Relation Age of Onset   Hypertension Father    Hypertension Maternal Grandmother    Diabetes Maternal Grandfather    Hypertension Maternal Grandfather    Breast cancer Other        30s/40s   Other Cousin  breast augmentation 2/2 to pain    Social History   Socioeconomic History   Marital status: Married    Spouse name: Kevon   Number of children: 1   Years of education: college   Highest education level: Not on file  Occupational History   Occupation: AR specialist- It Trainer: [employer]    Comment: [employer]  Tobacco Use   Smoking status: Never   Smokeless tobacco: Never  Vaping Use   Vaping status: Never Used  Substance and Sexual Activity   Alcohol use: Not Currently    Comment: occasional   Drug  use: No   Sexual activity: Yes    Birth control/protection: None  Other Topics Concern   Not on file  Social History Narrative   09/09/19   From: [place]  - her mom moved her after Hidaya finished college   Living: with husband Kevon and daughter Eulogio   Work: Theatre Stage Manager - surveyor, minerals      Family: mom nearby      Enjoys: reading, spend time with daughter - swimming, dancing      Exercise: not currently, planning to start exercise plan with sister   Diet: intermittent fasting       Safety   Seat belts: Yes    Guns: No   Safe in relationships: Yes    Social Drivers of Corporate Investment Banker Strain: Not on file  Food Insecurity: Not on file  Transportation Needs: Not on file  Physical Activity: Not on file  Stress: Not on file  Social Connections: Not on file  Intimate Partner Violence: Not on file     Current Outpatient Medications:    acetaminophen  (TYLENOL ) 500 MG tablet, Take 500 mg by mouth every 6 (six) hours as needed for mild pain., Disp: , Rfl:    Cyanocobalamin  (B-12) 2500 MCG SUBL, Place 1 tablet under the tongue daily at 2 PM., Disp: 90 tablet, Rfl: 1   loratadine (CLARITIN) 10 MG tablet, Take 10 mg by mouth daily as needed for allergies., Disp: , Rfl:    valACYclovir  (VALTREX ) 500 MG tablet, TAKE 1 TABLET (500 MG TOTAL) BY MOUTH 2 (TWO) TIMES DAILY. FOR 3 DAYS AS NEEDED FOR OUTBREAK, Disp: 30 tablet, Rfl: 1   phentermine  (ADIPEX-P ) 37.5 MG tablet, Take 1 tablet (37.5 mg total) by mouth daily before breakfast. (Patient not taking: Reported on 05/10/2024), Disp: 30 tablet, Rfl: 0   valACYclovir  (VALTREX ) 500 MG tablet, Take 1 tablet (500 mg total) by mouth daily., Disp: 90 tablet, Rfl: 3     ROS:  Review of Systems  Constitutional:  Negative for fatigue, fever and unexpected weight change.  Respiratory:  Negative for cough, shortness of breath and wheezing.   Cardiovascular:  Negative for chest pain, palpitations and leg swelling.   Gastrointestinal:  Negative for blood in stool, constipation, diarrhea, nausea and vomiting.  Endocrine: Negative for cold intolerance, heat intolerance and polyuria.  Genitourinary:  Negative for dyspareunia, dysuria, flank pain, frequency, genital sores, hematuria, menstrual problem, pelvic pain, urgency, vaginal bleeding, vaginal discharge and vaginal pain.  Musculoskeletal:  Negative for arthralgias, back pain, joint swelling and myalgias.  Skin:  Negative for rash.  Neurological:  Negative for dizziness, syncope, light-headedness, numbness and headaches.  Hematological:  Negative for adenopathy.  Psychiatric/Behavioral:  Negative for agitation, confusion, sleep disturbance and suicidal ideas. The patient is not nervous/anxious.    BREAST: No symptoms   Objective: BP 116/76   Pulse 88  Ht 5' 1 (1.549 m)   Wt 251 lb (113.9 kg)   LMP 03/26/2024   BMI 47.43 kg/m    Physical Exam Constitutional:      Appearance: She is well-developed.  Genitourinary:     Vulva normal.     Right Labia: No rash, tenderness or lesions.    Left Labia: No tenderness, lesions or rash.    No vaginal discharge, erythema or tenderness.      Right Adnexa: not tender and no mass present.    Left Adnexa: not tender and no mass present.    No cervical friability or polyp.     Uterus is not enlarged or tender.  Breasts:    Right: No mass, nipple discharge, skin change or tenderness.     Left: No mass, nipple discharge, skin change or tenderness.  Neck:     Thyroid : No thyromegaly.  Cardiovascular:     Rate and Rhythm: Normal rate and regular rhythm.     Heart sounds: Normal heart sounds. No murmur heard. Pulmonary:     Effort: Pulmonary effort is normal.     Breath sounds: Normal breath sounds.  Abdominal:     Palpations: Abdomen is soft.     Tenderness: There is no abdominal tenderness. There is no guarding or rebound.  Musculoskeletal:        General: Normal range of motion.     Cervical  back: Normal range of motion.  Lymphadenopathy:     Cervical: No cervical adenopathy.  Neurological:     General: No focal deficit present.     Mental Status: She is alert and oriented to person, place, and time.     Cranial Nerves: No cranial nerve deficit.  Skin:    General: Skin is warm and dry.  Psychiatric:        Mood and Affect: Mood normal.        Behavior: Behavior normal.        Thought Content: Thought content normal.        Judgment: Judgment normal.  Vitals reviewed.    Results for orders placed or performed in visit on 05/10/24 (from the past 24 hours)  POCT urine pregnancy     Status: None   Collection Time: 05/10/24  3:34 PM  Result Value Ref Range   Preg Test, Ur Negative Negative     Assessment/Plan: Encounter for annual routine gynecological examination  Cervical cancer screening - Plan: Cytology - PAP  Screening for HPV (human papillomavirus) - Plan: Cytology - PAP  Menorrhagia with regular cycle--menses mod to heavy off nuvaring; hx of 5 mm leio.   HSV-2 (herpes simplex virus 2) infection - Plan: valACYclovir  (VALTREX ) 500 MG tablet; Rx RF eRxd.   Late menses--neg UPT. F/u if no menses 11/25.   Pre-conception counseling - Plan: POCT urine pregnancy; neg UPT. Add folic acid  to MVI, give 6 months to conceive and f/u if no conception.    Meds ordered this encounter  Medications   valACYclovir  (VALTREX ) 500 MG tablet    Sig: Take 1 tablet (500 mg total) by mouth daily.    Dispense:  90 tablet    Refill:  3    Supervising Provider:   ROBY, MICIA [[id number]]             GYN counsel adequate intake of calcium and vitamin D , diet and exercise     F/U  Return in about 1 year (around 05/10/2025).  Adel Burch B. Jereme Loren, PA-C 05/10/2024 4:37 PM

## 2024-05-10 ENCOUNTER — Ambulatory Visit: Admitting: Obstetrics and Gynecology

## 2024-05-10 ENCOUNTER — Encounter: Payer: Self-pay | Admitting: Obstetrics and Gynecology

## 2024-05-10 ENCOUNTER — Inpatient Hospital Stay: Attending: Oncology

## 2024-05-10 ENCOUNTER — Other Ambulatory Visit (HOSPITAL_COMMUNITY)
Admission: RE | Admit: 2024-05-10 | Discharge: 2024-05-10 | Disposition: A | Discharge status: Home / Self Care | Source: Ambulatory Visit | Attending: Obstetrics and Gynecology | Admitting: Obstetrics and Gynecology

## 2024-05-10 VITALS — BP 116/76 | HR 88 | Ht 61.0 in | Wt 251.0 lb

## 2024-05-10 DIAGNOSIS — B009 Herpesviral infection, unspecified: Secondary | ICD-10-CM

## 2024-05-10 DIAGNOSIS — N921 Excessive and frequent menstruation with irregular cycle: Secondary | ICD-10-CM

## 2024-05-10 DIAGNOSIS — N926 Irregular menstruation, unspecified: Secondary | ICD-10-CM

## 2024-05-10 DIAGNOSIS — Z3169 Encounter for other general counseling and advice on procreation: Secondary | ICD-10-CM

## 2024-05-10 DIAGNOSIS — Z01419 Encounter for gynecological examination (general) (routine) without abnormal findings: Secondary | ICD-10-CM

## 2024-05-10 DIAGNOSIS — Z01411 Encounter for gynecological examination (general) (routine) with abnormal findings: Secondary | ICD-10-CM

## 2024-05-10 DIAGNOSIS — Z3202 Encounter for pregnancy test, result negative: Secondary | ICD-10-CM | POA: Diagnosis not present

## 2024-05-10 DIAGNOSIS — Z124 Encounter for screening for malignant neoplasm of cervix: Secondary | ICD-10-CM | POA: Insufficient documentation

## 2024-05-10 DIAGNOSIS — Z1151 Encounter for screening for human papillomavirus (HPV): Secondary | ICD-10-CM | POA: Diagnosis present

## 2024-05-10 DIAGNOSIS — B3731 Acute candidiasis of vulva and vagina: Secondary | ICD-10-CM

## 2024-05-10 DIAGNOSIS — E538 Deficiency of other specified B group vitamins: Secondary | ICD-10-CM | POA: Insufficient documentation

## 2024-05-10 DIAGNOSIS — D508 Other iron deficiency anemias: Secondary | ICD-10-CM

## 2024-05-10 DIAGNOSIS — N920 Excessive and frequent menstruation with regular cycle: Secondary | ICD-10-CM

## 2024-05-10 DIAGNOSIS — Z3044 Encounter for surveillance of vaginal ring hormonal contraceptive device: Secondary | ICD-10-CM

## 2024-05-10 LAB — POCT URINE PREGNANCY: Preg Test, Ur: NEGATIVE

## 2024-05-10 MED ORDER — CYANOCOBALAMIN 1000 MCG/ML IJ SOLN
1000.0000 ug | Freq: Once | INTRAMUSCULAR | Status: AC
  Administered 2024-05-10: 1000 ug via INTRAMUSCULAR
  Filled 2024-05-10: qty 1

## 2024-05-10 MED ORDER — VALACYCLOVIR HCL 500 MG PO TABS: 500.0000 mg | ORAL_TABLET | Freq: Every day | ORAL | 3 refills | Status: AC

## 2024-05-10 NOTE — Patient Instructions (Signed)
 I value your feedback and you entrusting [place] with your care. If you get a [place] patient survey, I would appreciate you taking the time to let us know about your experience today. Thank you! ? ? ?

## 2024-05-12 LAB — CYTOLOGY - PAP
Adequacy: ABSENT
Comment: NEGATIVE
Diagnosis: NEGATIVE
High risk HPV: NEGATIVE

## 2024-06-01 ENCOUNTER — Encounter (HOSPITAL_COMMUNITY): Payer: Self-pay | Admitting: *Deleted

## 2024-06-14 ENCOUNTER — Inpatient Hospital Stay: Attending: Oncology

## 2024-06-14 DIAGNOSIS — E538 Deficiency of other specified B group vitamins: Secondary | ICD-10-CM | POA: Diagnosis present

## 2024-06-14 DIAGNOSIS — Z9884 Bariatric surgery status: Secondary | ICD-10-CM | POA: Diagnosis not present

## 2024-06-14 DIAGNOSIS — K9589 Other complications of other bariatric procedure: Secondary | ICD-10-CM

## 2024-06-14 DIAGNOSIS — D508 Other iron deficiency anemias: Secondary | ICD-10-CM | POA: Diagnosis present

## 2024-06-14 LAB — CBC WITH DIFFERENTIAL (CANCER CENTER ONLY)
Abs Immature Granulocytes: 0.05 K/uL (ref 0.00–0.07)
Basophils Absolute: 0.1 K/uL (ref 0.0–0.1)
Basophils Relative: 0 %
Eosinophils Absolute: 0.2 K/uL (ref 0.0–0.5)
Eosinophils Relative: 1 %
HCT: 35.7 % — ABNORMAL LOW (ref 36.0–46.0)
Hemoglobin: 11.7 g/dL — ABNORMAL LOW (ref 12.0–15.0)
Immature Granulocytes: 0 %
Lymphocytes Relative: 24 %
Lymphs Abs: 3.2 K/uL (ref 0.7–4.0)
MCH: 28.1 pg (ref 26.0–34.0)
MCHC: 32.8 g/dL (ref 30.0–36.0)
MCV: 85.6 fL (ref 80.0–100.0)
Monocytes Absolute: 0.7 K/uL (ref 0.1–1.0)
Monocytes Relative: 5 %
Neutro Abs: 9 K/uL — ABNORMAL HIGH (ref 1.7–7.7)
Neutrophils Relative %: 70 %
Platelet Count: 341 K/uL (ref 150–400)
RBC: 4.17 MIL/uL (ref 3.87–5.11)
RDW: 13.6 % (ref 11.5–15.5)
WBC Count: 13.1 K/uL — ABNORMAL HIGH (ref 4.0–10.5)
nRBC: 0 % (ref 0.0–0.2)

## 2024-06-14 LAB — RETIC PANEL
Immature Retic Fract: 13 % (ref 2.3–15.9)
RBC.: 4.21 MIL/uL (ref 3.87–5.11)
Retic Count, Absolute: 66.9 K/uL (ref 19.0–186.0)
Retic Ct Pct: 1.6 % (ref 0.4–3.1)
Reticulocyte Hemoglobin: 32.3 pg (ref >27.9)

## 2024-06-14 LAB — IRON AND TIBC
Iron: 44 ug/dL (ref 28–170)
Saturation Ratios: 11 % (ref 10.4–31.8)
TIBC: 423 ug/dL (ref 250–450)
UIBC: 378 ug/dL

## 2024-06-14 LAB — VITAMIN B12: Vitamin B-12: 445 pg/mL (ref 180–914)

## 2024-06-14 LAB — FERRITIN: Ferritin: 43 ng/mL (ref 11–307)

## 2024-06-16 ENCOUNTER — Inpatient Hospital Stay: Admitting: Oncology

## 2024-06-16 ENCOUNTER — Inpatient Hospital Stay

## 2024-06-16 ENCOUNTER — Encounter: Payer: Self-pay | Admitting: Oncology

## 2024-06-16 VITALS — BP 112/80 | HR 88

## 2024-06-16 VITALS — BP 130/89 | HR 89 | Temp 98.1°F | Resp 18 | Wt 248.5 lb

## 2024-06-16 DIAGNOSIS — D508 Other iron deficiency anemias: Secondary | ICD-10-CM

## 2024-06-16 DIAGNOSIS — K9589 Other complications of other bariatric procedure: Secondary | ICD-10-CM

## 2024-06-16 DIAGNOSIS — E538 Deficiency of other specified B group vitamins: Secondary | ICD-10-CM | POA: Diagnosis not present

## 2024-06-16 MED ORDER — CYANOCOBALAMIN 1000 MCG/ML IJ SOLN
1000.0000 ug | Freq: Once | INTRAMUSCULAR | Status: AC
  Administered 2024-06-16: 1000 ug via INTRAMUSCULAR
  Filled 2024-06-16: qty 1

## 2024-06-16 MED ORDER — SODIUM CHLORIDE 0.9% FLUSH
10.0000 mL | Freq: Once | INTRAVENOUS | Status: AC | PRN
  Administered 2024-06-16: 10 mL
  Filled 2024-06-16: qty 10

## 2024-06-16 MED ORDER — IRON SUCROSE 20 MG/ML IV SOLN
200.0000 mg | Freq: Once | INTRAVENOUS | Status: AC
  Administered 2024-06-16: 200 mg via INTRAVENOUS
  Filled 2024-06-16: qty 10

## 2024-06-16 NOTE — Assessment & Plan Note (Addendum)
 Iron  deficiency anemia, in the setting of gastric bypass. Labs are reviewed and discussed with patient. Lab Results  Component Value Date   HGB 11.7 (L) 06/14/2024   TIBC 423 06/14/2024   IRONPCTSAT 11 06/14/2024   FERRITIN 43 06/14/2024  Hb slightly decreased.  Recommend IV venofer  200mg  x 2

## 2024-06-16 NOTE — Assessment & Plan Note (Signed)
B12 injection monthly

## 2024-06-16 NOTE — Addendum Note (Signed)
 Addended by: BABARA CALL on: 06/16/2024 09:30 PM   Modules accepted: Orders

## 2024-06-16 NOTE — Progress Notes (Signed)
 Hematology/Oncology Progress note [phone] [phone]     CHIEF COMPLAINTS/REASON FOR VISIT:  Iron  deficiency anemia and gastric bypass  ASSESSMENT & PLAN:   Iron  deficiency anemia following bariatric surgery Iron  deficiency anemia, in the setting of gastric bypass. Labs are reviewed and discussed with patient. Lab Results  Component Value Date   HGB 11.7 (L) 06/14/2024   TIBC 423 06/14/2024   IRONPCTSAT 11 06/14/2024   FERRITIN 43 06/14/2024  Hb slightly decreased.  Recommend IV venofer  200mg  x 2   Low serum vitamin B12 B12 injection monthly    Orders Placed This Encounter  Procedures   CBC with Differential (Cancer Center Only)    Standing Status:   Future    Expected Date:   12/15/2024    Expiration Date:   03/15/2025   Ferritin    Standing Status:   Future    Expected Date:   12/15/2024    Expiration Date:   03/15/2025   Iron  and TIBC    Standing Status:   Future    Expected Date:   12/15/2024    Expiration Date:   03/15/2025   Retic Panel    Standing Status:   Future    Expected Date:   12/15/2024    Expiration Date:   03/15/2025   Vitamin B12    Standing Status:   Future    Expected Date:   12/15/2024    Expiration Date:   03/15/2025   Pregnancy, urine    Standing Status:   Future    Expected Date:   12/15/2024    Expiration Date:   03/15/2025   Pregnancy, urine    Standing Status:   Future    Expected Date:   06/23/2024    Expiration Date:   09/21/2024   Follow up in 4 months.  All questions were answered. The patient knows to call the clinic with any problems, questions or concerns.  Zelphia Cap, MD, PhD [facility] Hematology Oncology 06/16/2024   HISTORY OF PRESENTING ILLNESS:   Nancy Owen is a  38 y.o.  female with PMH listed below was seen in consultation at the request of  Velma Raisin, MD  for evaluation of anemia  10/30/2021, status post gastric bypass by Dr. Gladis [facility]. 11/01/2021, hemoglobin 7.6.  MCV 70.8,WBC 13.4, platelet  count 365. 11/21/2021, patient had iron  panel done which showed Iron  saturation 4, folate 11.5  Patient was referred to establish care for management of anemia. +fatigue.  Denies nausea vomiting abdominal pain, Denies black or bloody stool.  INTERVAL HISTORY Nancy Owen is a 38 y.o. female who has above history reviewed by me today presents for follow up visit for management of IDA She tolerates venofer  treatments. Fatigue, chronic, unchanged.  + fatigue. Heavy menstrual bleeding.  She is on B12 injection monthly.     Review of Systems  Constitutional:  Positive for fatigue. Negative for appetite change, chills and fever.  HENT:   Negative for hearing loss and voice change.   Eyes:  Negative for eye problems.  Respiratory:  Negative for chest tightness and cough.   Cardiovascular:  Negative for chest pain.  Gastrointestinal:  Negative for abdominal distention, abdominal pain and blood in stool.  Endocrine: Negative for hot flashes.  Genitourinary:  Positive for menstrual problem. Negative for difficulty urinating and frequency.   Musculoskeletal:  Negative for arthralgias.  Skin:  Negative for itching and rash.  Neurological:  Negative for extremity weakness.  Hematological:  Negative for adenopathy.  Psychiatric/Behavioral:  Negative for confusion.     MEDICAL HISTORY:  Past Medical History:  Diagnosis Date   Allergic rhinitis    Encounter for insertion of Mirena  IUD    GERD (gastroesophageal reflux disease)    Gonorrhea    Headache    Herpes genitalia    High-risk pregnancy 01/17/2014   chorio/mild utering atony   History of hiatal hernia    History of syphilis    Obesity    Sleep apnea    awaitin new one   Syphilis     SURGICAL HISTORY: Past Surgical History:  Procedure Laterality Date   BREAST REDUCTION SURGERY Bilateral 11/02/2019   Procedure: MAMMARY REDUCTION  (BREAST);  Surgeon: Elisabeth Craig RAMAN, MD;  Location: [facility];  Service:  Plastics;  Laterality: Bilateral;  3 hours   CHOLECYSTECTOMY     10/2020   FRACTURE SURGERY  2004   L left tibia   GASTRIC ROUX-EN-Y N/A 10/30/2021   Procedure: LAPAROSCOPIC ROUX-EN-Y GASTRIC BYPASS WITH UPPER ENDOSCOPY, conversion from sleeve;  Surgeon: Gladis Cough, MD;  Location: [facility];  Service: General;  Laterality: N/A;   LAPAROSCOPIC GASTRIC SLEEVE RESECTION N/A 07/25/2015   Procedure: LAPAROSCOPIC GASTRIC SLEEVE RESECTION;  Surgeon: Morene Olives, MD;  Location: [facility];  Service: General;  Laterality: N/A;   LAPAROSCOPIC GASTRIC SLEEVE RESECTION      SOCIAL HISTORY: Social History   Socioeconomic History   Marital status: Married    Spouse name: Glass Blower/designer   Number of children: 1   Years of education: college   Highest education level: Not on file  Occupational History   Occupation: AR specialist- It Trainer: [employer]    Comment: [employer]  Tobacco Use   Smoking status: Never   Smokeless tobacco: Never  Vaping Use   Vaping status: Never Used  Substance and Sexual Activity   Alcohol use: Not Currently    Comment: occasional   Drug use: No   Sexual activity: Yes    Birth control/protection: None  Other Topics Concern   Not on file  Social History Narrative   09/09/19   From: [place]  - her mom moved her after Izabell finished college   Living: with husband Kevon and daughter Eulogio   Work: Theatre Stage Manager - surveyor, [employer]      Family: mom nearby      Enjoys: reading, spend time with daughter - swimming, dancing      Exercise: not currently, planning to start exercise plan with sister   Diet: intermittent fasting       Safety   Seat belts: Yes    Guns: No   Safe in relationships: Yes    Social Drivers of Corporate Investment Banker Strain: Not on file  Food Insecurity: Not on file  Transportation Needs: Not on file  Physical Activity: Not on file  Stress: Not on file  Social Connections: Not on file  Intimate Partner Violence: Not on  file    FAMILY HISTORY: Family History  Problem Relation Age of Onset   Hypertension Father    Hypertension Maternal Grandmother    Diabetes Maternal Grandfather    Hypertension Maternal Grandfather    Breast cancer Other        30s/40s   Other Cousin        breast augmentation 2/2 to pain    ALLERGIES:  has no known allergies.  MEDICATIONS:  Current Outpatient Medications  Medication Sig Dispense Refill   acetaminophen  (TYLENOL )  500 MG tablet Take 500 mg by mouth every 6 (six) hours as needed for mild pain.     Cyanocobalamin  (B-12) 2500 MCG SUBL Place 1 tablet under the tongue daily at 2 PM. 90 tablet 1   loratadine (CLARITIN) 10 MG tablet Take 10 mg by mouth daily as needed for allergies.     valACYclovir  (VALTREX ) 500 MG tablet TAKE 1 TABLET (500 MG TOTAL) BY MOUTH 2 (TWO) TIMES DAILY. FOR 3 DAYS AS NEEDED FOR OUTBREAK 30 tablet 1   valACYclovir  (VALTREX ) 500 MG tablet Take 1 tablet (500 mg total) by mouth daily. 90 tablet 3   phentermine  (ADIPEX-P ) 37.5 MG tablet Take 1 tablet (37.5 mg total) by mouth daily before breakfast. (Patient not taking: Reported on 06/16/2024) 30 tablet 0   No current facility-administered medications for this visit.     PHYSICAL EXAMINATION:  Vitals:   06/16/24 1500  BP: 130/89  Pulse: 89  Resp: 18  Temp: 98.1 F (36.7 C)  SpO2: 98%   Filed Weights   06/16/24 1500  Weight: 248 lb 8 oz (112.7 kg)    Physical Exam Constitutional:      General: She is not in acute distress. HENT:     Head: Normocephalic and atraumatic.  Eyes:     General: No scleral icterus. Cardiovascular:     Rate and Rhythm: Normal rate and regular rhythm.     Heart sounds: Normal heart sounds.  Pulmonary:     Effort: Pulmonary effort is normal. No respiratory distress.     Breath sounds: No wheezing.  Abdominal:     General: Bowel sounds are normal.     Palpations: Abdomen is soft.  Musculoskeletal:        General: Normal range of motion.     Cervical  back: Normal range of motion and neck supple.  Skin:    General: Skin is warm and dry.     Findings: No erythema or rash.  Neurological:     Mental Status: She is alert and oriented to person, place, and time. Mental status is at baseline.  Psychiatric:        Mood and Affect: Mood normal.     LABORATORY DATA:  I have reviewed the data as listedIron/TIBC/Ferritin/ %Sat    Component Value Date/Time   IRON  44 06/14/2024 1605   TIBC 423 06/14/2024 1605   FERRITIN 43 06/14/2024 1605   IRONPCTSAT 11 06/14/2024 1605      RADIOGRAPHIC STUDIES: I have personally reviewed the radiological images as listed and agreed with the findings in the report. No results found.

## 2024-07-01 ENCOUNTER — Inpatient Hospital Stay

## 2024-07-06 ENCOUNTER — Inpatient Hospital Stay

## 2024-07-06 VITALS — BP 106/58 | HR 92 | Temp 98.7°F | Resp 17

## 2024-07-06 DIAGNOSIS — D508 Other iron deficiency anemias: Secondary | ICD-10-CM | POA: Diagnosis not present

## 2024-07-06 MED ORDER — SODIUM CHLORIDE 0.9% FLUSH
10.0000 mL | Freq: Once | INTRAVENOUS | Status: AC | PRN
  Administered 2024-07-06: 10 mL
  Filled 2024-07-06: qty 10

## 2024-07-06 MED ORDER — IRON SUCROSE 20 MG/ML IV SOLN
200.0000 mg | Freq: Once | INTRAVENOUS | Status: AC
  Administered 2024-07-06: 200 mg via INTRAVENOUS
  Filled 2024-07-06: qty 10

## 2024-07-06 NOTE — Patient Instructions (Signed)

## 2024-07-06 NOTE — Progress Notes (Signed)
Patient tolerated Venofer infusion well. Explained recommendation of 30 min post monitoring. Patient refused to wait post monitoring. Educated on what signs to watch for & to call with any concerns. No question, discharged. Stable  

## 2024-07-06 NOTE — Progress Notes (Signed)
 Patient refused pregnancy test, states she is currently on her cycle

## 2024-07-19 ENCOUNTER — Inpatient Hospital Stay: Attending: Oncology

## 2024-07-19 DIAGNOSIS — E538 Deficiency of other specified B group vitamins: Secondary | ICD-10-CM | POA: Diagnosis present

## 2024-07-19 DIAGNOSIS — K9589 Other complications of other bariatric procedure: Secondary | ICD-10-CM

## 2024-07-19 MED ORDER — CYANOCOBALAMIN 1000 MCG/ML IJ SOLN
1000.0000 ug | Freq: Once | INTRAMUSCULAR | Status: AC
  Administered 2024-07-19: 1000 ug via INTRAMUSCULAR
  Filled 2024-07-19: qty 1

## 2024-07-28 ENCOUNTER — Ambulatory Visit: Admitting: Family Medicine

## 2024-07-28 VITALS — BP 121/85 | HR 89 | Temp 98.1°F | Ht 61.0 in | Wt 250.3 lb

## 2024-07-28 DIAGNOSIS — E66813 Obesity, class 3: Secondary | ICD-10-CM | POA: Diagnosis not present

## 2024-07-28 DIAGNOSIS — K9589 Other complications of other bariatric procedure: Secondary | ICD-10-CM | POA: Diagnosis not present

## 2024-07-28 DIAGNOSIS — E282 Polycystic ovarian syndrome: Secondary | ICD-10-CM | POA: Diagnosis not present

## 2024-07-28 DIAGNOSIS — G4733 Obstructive sleep apnea (adult) (pediatric): Secondary | ICD-10-CM | POA: Diagnosis not present

## 2024-07-28 DIAGNOSIS — K8020 Calculus of gallbladder without cholecystitis without obstruction: Secondary | ICD-10-CM

## 2024-07-28 DIAGNOSIS — E538 Deficiency of other specified B group vitamins: Secondary | ICD-10-CM | POA: Diagnosis not present

## 2024-07-28 DIAGNOSIS — F5101 Primary insomnia: Secondary | ICD-10-CM

## 2024-07-28 DIAGNOSIS — Z9884 Bariatric surgery status: Secondary | ICD-10-CM

## 2024-07-28 DIAGNOSIS — N920 Excessive and frequent menstruation with regular cycle: Secondary | ICD-10-CM

## 2024-07-28 DIAGNOSIS — Z9889 Other specified postprocedural states: Secondary | ICD-10-CM

## 2024-07-28 DIAGNOSIS — Z7689 Persons encountering health services in other specified circumstances: Secondary | ICD-10-CM

## 2024-07-28 DIAGNOSIS — I890 Lymphedema, not elsewhere classified: Secondary | ICD-10-CM | POA: Diagnosis not present

## 2024-07-28 DIAGNOSIS — D508 Other iron deficiency anemias: Secondary | ICD-10-CM | POA: Diagnosis not present

## 2024-07-28 DIAGNOSIS — Z8619 Personal history of other infectious and parasitic diseases: Secondary | ICD-10-CM

## 2024-07-28 DIAGNOSIS — B009 Herpesviral infection, unspecified: Secondary | ICD-10-CM

## 2024-07-28 NOTE — Patient Instructions (Signed)
 To keep you healthy, please keep in mind the following health maintenance items that you are due for:   Health Maintenance Due  Topic Date Due   COVID-19 Vaccine (3 - 2025-26 season) 03/15/2024     Best Wishes,   Dr. Lang

## 2024-07-28 NOTE — Progress Notes (Signed)
 "  New Patient Office Visit  Patient ID: Nancy Owen, Female   DOB: 1986-06-12 39 y.o. MRN: [id number]  Chief Complaint  Patient presents with   Establish Care    Patient is present to establish care with new PCP Diet high in protein, trying to get 3 meals daily. Exercises by walking after work, 30 mins 2x weekly. Wanted to discuss how to stay active and make sure she is eating well    Menorrhagia    Reports heavy flow cycles after having her daughter 10 yr ago. OBGYN does not feel like fibroid US  is necessary but patient is interested in doing this, wanted to discuss    Subjective:     Nancy Owen presents to establish care  HPI  Discussed the use of AI scribe software for clinical note transcription with the patient, who gave verbal consent to proceed.  History of Present Illness Nancy Owen is a 39 year old female who presents to establish care and discuss weight management and nutrition.  She is interested in strategies for staying active and eating well. She currently exercises by walking for thirty minutes twice a week and is considering increasing her activity. She is also interested in learning more about vitamins and supplements, particularly for her iron  deficiency and vitamin B12 deficiency. She does not currently take any multivitamins but receives monthly iron  infusions and vitamin B12 injections.  She has a history of menorrhagia with heavy menstrual flows, changing super maxi pads six to seven times a day. There is a family history of fibroids, as her mother and aunts have had fibroids removed post-menopause. She has previously used Nuvaring, which did not help with the bleeding, and is interested in further evaluation for fibroids.  She underwent bariatric surgery, including a gastric sleeve in 2017 and a gastric bypass in 2023. Initially, she lost weight after the sleeve surgery, dropping from 278 lbs to 188 lbs, but has since regained weight, currently  fluctuating between 230 to 250 lbs. She has tried phentermine  in the past, resulting in a 15-20 lb weight loss, but is no longer using it. She is exploring other weight loss options and is considering a wellness program in [place].  She has a history of obstructive sleep apnea, initially diagnosed in 2013, with a recent home sleep study in 2023 showing moderate sleep apnea with an AHI of 25.7 and oxygen levels dropping to 76%. She does not currently use a CPAP machine due to cost issues but reports feeling more rested when she used it in the past.  She has a history of vitamin B12 deficiency, for which she receives monthly injections. 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 She has undergone several surgeries, including a bilateral breast reduction, cholecystectomy, and fracture surgery.  Socially, she works from home and finds it challenging to maintain a routine that includes regular breaks and physical activity. She is originally from [place]  and does not smoke or consume alcohol regularly.   Outpatient Encounter Medications as of 07/28/2024  Medication Sig   acetaminophen  (TYLENOL ) 500 MG tablet Take 500 mg by mouth every 6 (six) hours as needed for mild pain.   Cyanocobalamin  (B-12) 2500 MCG SUBL Place 1 tablet under the tongue daily at 2 PM.   loratadine (CLARITIN) 10 MG tablet Take 10 mg by mouth daily as needed for allergies.   valACYclovir  (VALTREX ) 500 MG tablet TAKE 1 TABLET (500 MG  TOTAL) BY MOUTH 2 (TWO) TIMES DAILY. FOR 3 DAYS AS NEEDED FOR OUTBREAK   valACYclovir  (VALTREX ) 500 MG tablet Take 1 tablet (500 mg total) by mouth daily.   [DISCONTINUED] phentermine  (ADIPEX-P ) 37.5 MG tablet Take 1 tablet (37.5 mg total) by mouth daily before breakfast. (Patient not taking: Reported on 06/16/2024)   No facility-administered encounter medications on file as of 07/28/2024.    Past Medical History:  Diagnosis  Date   Allergic rhinitis    Anemia    Encounter for insertion of Mirena  IUD    GERD (gastroesophageal reflux disease)    Gonorrhea    Headache    Herpes genitalia    High-risk pregnancy 01/17/2014   chorio/mild utering atony   History of hiatal hernia    History of syphilis    Obesity    Sleep apnea    awaitin new one   Syphilis     Past Surgical History:  Procedure Laterality Date   BREAST REDUCTION SURGERY Bilateral 11/02/2019   Procedure: MAMMARY REDUCTION  (BREAST);  Surgeon: Elisabeth Craig RAMAN, MD;  Location: [facility];  Service: Plastics;  Laterality: Bilateral;  3 hours   BREAST SURGERY     CHOLECYSTECTOMY     10/2020   EYE SURGERY     FRACTURE SURGERY  2004   L left tibia   GASTRIC ROUX-EN-Y N/A 10/30/2021   Procedure: LAPAROSCOPIC ROUX-EN-Y GASTRIC BYPASS WITH UPPER ENDOSCOPY, conversion from sleeve;  Surgeon: Gladis Cough, MD;  Location: [facility];  Service: General;  Laterality: N/A;   LAPAROSCOPIC GASTRIC SLEEVE RESECTION N/A 07/25/2015   Procedure: LAPAROSCOPIC GASTRIC SLEEVE RESECTION;  Surgeon: Morene Olives, MD;  Location: [facility];  Service: General;  Laterality: N/A;   LAPAROSCOPIC GASTRIC SLEEVE RESECTION      Family History  Problem Relation Age of Onset   Obesity Mother    Hypertension Father    Hypertension Maternal Grandmother    Kidney disease Maternal Grandmother    Heart disease Maternal Grandmother    Obesity Maternal Grandmother    Diabetes Maternal Grandfather    Hypertension Maternal Grandfather    Breast cancer Other        30s/40s   Cancer Other    Other Cousin        breast augmentation 2/2 to pain    Social History   Socioeconomic History   Marital status: Married    Spouse name: Nancy Owen   Number of children: 1   Years of education: college   Highest education level: Bachelor's degree (e.g., BA, AB, BS)  Occupational History   Occupation: AR specialist- It Trainer: [employer]    Comment: [employer]   Tobacco Use   Smoking status: Never   Smokeless tobacco: Never  Vaping Use   Vaping status: Never Used  Substance and Sexual Activity   Alcohol use: Not Currently    Comment: occasional   Drug use: Never   Sexual activity: Yes    Birth control/protection: None  Other Topics Concern   Not on file  Social History Narrative   09/09/19   From: [place]  - her mom moved her after Quintella finished college   Living: with husband Nancy Owen and daughter Eulogio   Work: Theatre Stage Manager - surveyor, minerals      Family: mom nearby      Enjoys: reading, spend time with daughter - swimming, dancing      Exercise: not currently, planning to start exercise plan with sister   Diet:  intermittent fasting       Safety   Seat belts: Yes    Guns: No   Safe in relationships: Yes    Social Drivers of Health   Tobacco Use: Low Risk (07/28/2024)   Patient History    Smoking Tobacco Use: Never    Smokeless Tobacco Use: Never    Passive Exposure: Not on file  Financial Resource Strain: Medium Risk (07/28/2024)   Overall Financial Resource Strain (CARDIA)    Difficulty of Paying Living Expenses: Somewhat hard  Food Insecurity: Food Insecurity Present (07/28/2024)   Epic    Worried About Programme Researcher, Broadcasting/film/video in the Last Year: Sometimes true    Ran Out of Food in the Last Year: Never true  Transportation Needs: No Transportation Needs (07/28/2024)   Epic    Lack of Transportation (Medical): No    Lack of Transportation (Non-Medical): No  Physical Activity: Unknown (07/28/2024)   Exercise Vital Sign    Days of Exercise per Week: Patient declined    Minutes of Exercise per Session: Not on file  Stress: No Stress Concern Present (07/28/2024)   [employer] of Occupational Health - Occupational Stress Questionnaire    Feeling of Stress: Only a little  Social Connections: Moderately Integrated (07/28/2024)   Social Connection and Isolation Panel    Frequency of Communication with Friends and  Family: More than three times a week    Frequency of Social Gatherings with Friends and Family: Once a week    Attends Religious Services: 1 to 4 times per year    Active Member of [employer] or Organizations: No    Attends Engineer, Structural: Not on file    Marital Status: Married  Catering Manager Violence: Not on file  Depression (PHQ2-9): Medium Risk (07/28/2024)   Depression (PHQ2-9)    PHQ-2 Score: 8  Alcohol Screen: Low Risk (07/28/2024)   Alcohol Screen    Last Alcohol Screening Score (AUDIT): 1  Housing: High Risk (07/28/2024)   Epic    Unable to Pay for Housing in the Last Year: Yes    Number of Times Moved in the Last Year: 0    Homeless in the Last Year: No  Utilities: Not on file  Health Literacy: Not on file    ROS    Objective:    BP 121/85 (BP Location: Right Arm, Patient Position: Sitting, Cuff Size: Normal)   Pulse 89   Temp 98.1 F (36.7 C) (Oral)   Ht 5' 1 (1.549 m)   Wt 250 lb 4.8 oz (113.5 kg)   LMP 07/11/2024 (Exact Date)   SpO2 100%   BMI 47.29 kg/m   Physical Exam Physical Exam VITALS: BP- 146/88 GENERAL: Alert, well developed, no acute distress. NECK: Thyroid  normal. CHEST: Lungs clear to auscultation bilaterally. CARDIOVASCULAR: Normal heart rate and rhythm, S1 and S2 normal without murmurs. ABDOMEN: Soft, normal bowel sounds.    Last CBC Lab Results  Component Value Date   WBC 13.1 (H) 06/14/2024   HGB 11.7 (L) 06/14/2024   HCT 35.7 (L) 06/14/2024   MCV 85.6 06/14/2024   MCH 28.1 06/14/2024   RDW 13.6 06/14/2024   PLT 341 06/14/2024   Last metabolic panel Lab Results  Component Value Date   GLUCOSE 85 07/28/2024   NA 140 07/28/2024   K 4.1 07/28/2024   CL 106 07/28/2024   CO2 20 07/28/2024   BUN 9 07/28/2024   CREATININE 0.60 07/28/2024   EGFR 119 07/28/2024   CALCIUM  8.9 07/28/2024   PROT 7.3 07/28/2024   ALBUMIN 4.1 07/28/2024   LABGLOB 3.2 07/28/2024   AGRATIO 1.3 06/24/2012   BILITOT 0.3 07/28/2024    ALKPHOS 116 07/28/2024   AST 13 07/28/2024   ALT 22 07/28/2024   ANIONGAP 8 09/04/2022   Last lipids Lab Results  Component Value Date   CHOL 152 07/28/2024   HDL 45 07/28/2024   LDLCALC 78 07/28/2024   TRIG 167 (H) 07/28/2024   CHOLHDL 3.4 07/28/2024   Last hemoglobin A1c Lab Results  Component Value Date   HGBA1C 5.2 07/28/2024   Last thyroid  functions Lab Results  Component Value Date   TSH 0.662 07/28/2024   FREET4 0.94 07/28/2024   Last vitamin D  Lab Results  Component Value Date   VD25OH 12.1 (L) 07/28/2024   Last vitamin B12 and Folate Lab Results  Component Value Date   VITAMINB12 445 06/14/2024   FOLATE 22.0 09/04/2022        Assessment & Plan:   Problem List Items Addressed This Visit     Class III morbid obesity with body mass index (BMI) of 40.0 to 49.9 (HCC)   Relevant Orders   VITAMIN D  25 Hydroxy (Vit-D Deficiency, Fractures) (Completed)   Hemoglobin A1c (Completed)   TSH + free T4 (Completed)   CMP14+EGFR (Completed)   Lipid panel (Completed)   History of syphilis   HSV-2 (herpes simplex virus 2) infection   Insomnia - Primary   Iron  deficiency anemia following bariatric surgery (Chronic)   Low serum vitamin B12 (Chronic)   Lymphedema   Menorrhagia with regular cycle   Relevant Orders   US  Pelvic Complete With Transvaginal   OSA (obstructive sleep apnea)   Relevant Orders   For home use only DME continuous positive airway pressure (CPAP)   PCOS (polycystic ovarian syndrome)   Relevant Orders   US  Pelvic Complete With Transvaginal   S/P bilateral breast reduction   S/P gastric bypass   Symptomatic cholelithiasis   Other Visit Diagnoses       Encounter to establish care with new provider           Assessment and Plan Assessment & Plan Establishing Care  Establishing care with a focus on exercise, healthy weight management, and vitamin supplementation. Interested in multivitamins and prenatal vitamins due to potential  pregnancy. - Encouraged moderate-intensity exercise 4 days a week, aiming for 200-240 minutes per week. - Recommended swimming as an alternative exercise option. - Advised monitoring caloric intake to maintain a calorie deficit, aiming for under 2000 calories per day. - Discussed potential use of prenatal vitamins if planning pregnancy.  Class III obesity status post bariatric surgery Chronic  Status post gastric sleeve in 2017 and gastric bypass in 2023. Weight stabilized between 230-250 lbs. Previous phentermine  use was effective but discontinued. Interested in new weight loss medications like Wegovy or Zypram. Challenges with accessing fitness programs due to location and time constraints. - Encouraged participation in the wellness program in [place]. - Discussed potential use of Wegovy or Zypram for weight loss. - Advised on increasing physical activity and monitoring caloric intake.  Iron  deficiency anemia following bariatric surgery Chronic  Iron  deficiency anemia managed with monthly Venofer  infusions. Hematologist manages treatment with cycles of infusions and breaks to prevent liver overload. - Continue monthly Venofer  infusions as per hematologist's plan.  Menorrhagia with uterine fibroids  polycystic ovarian syndrome Chronic  Menorrhagia with heavy flows and pain. Previous ultrasound showed 5 mm fibroids and PCOS. Currently not  on hormonal treatment due to desire for pregnancy. Interested in ultrasound to assess fibroid status. - Ordered pelvic ultrasound to assess fibroid status. - Will coordinate with OB/GYN for further management based on ultrasound results.  Obstructive sleep apnea Chronic  Moderate obstructive sleep apnea with AHI of 25.7 and oxygen desaturation to 76%. Previously used CPAP but discontinued due to cost. Reports poor sleep quality and fatigue. Interested in obtaining a new CPAP machine using FSA benefits. - Ordered CPAP machine. - Explored options for  obtaining CPAP machine using FSA benefits. - Coordinated with sleep study results from 2023.  Primary insomnia Chronic  Reports frequent awakenings and poor sleep quality, potentially related to obstructive sleep apnea. No current CPAP use due to cost. - Address insomnia by managing obstructive sleep apnea with CPAP.  Vitamin B12 deficiency Chronic  Managed with monthly B12 injections. - Continue monthly B12 injections. - f/u with heme as scheduled     Return in about 3 months (around 10/26/2024) for menorrhagia, Weight MGMT(bariatic spec).   Rockie Agent, MD [facility]     "

## 2024-07-29 ENCOUNTER — Ambulatory Visit: Payer: Self-pay | Admitting: Family Medicine

## 2024-07-29 DIAGNOSIS — E559 Vitamin D deficiency, unspecified: Secondary | ICD-10-CM

## 2024-07-29 LAB — CMP14+EGFR
ALT: 22 IU/L (ref 0–32)
AST: 13 IU/L (ref 0–40)
Albumin: 4.1 g/dL (ref 3.9–4.9)
Alkaline Phosphatase: 116 IU/L (ref 41–116)
BUN/Creatinine Ratio: 15 (ref 9–23)
BUN: 9 mg/dL (ref 6–20)
Bilirubin Total: 0.3 mg/dL (ref 0.0–1.2)
CO2: 20 mmol/L (ref 20–29)
Calcium: 8.9 mg/dL (ref 8.7–10.2)
Chloride: 106 mmol/L (ref 96–106)
Creatinine, Ser: 0.6 mg/dL (ref 0.57–1.00)
Globulin, Total: 3.2 g/dL (ref 1.5–4.5)
Glucose: 85 mg/dL (ref 70–99)
Potassium: 4.1 mmol/L (ref 3.5–5.2)
Sodium: 140 mmol/L (ref 134–144)
Total Protein: 7.3 g/dL (ref 6.0–8.5)
eGFR: 119 mL/min/1.73

## 2024-07-29 LAB — LIPID PANEL
Chol/HDL Ratio: 3.4 ratio (ref 0.0–4.4)
Cholesterol, Total: 152 mg/dL (ref 100–199)
HDL: 45 mg/dL
LDL Chol Calc (NIH): 78 mg/dL (ref 0–99)
Triglycerides: 167 mg/dL — ABNORMAL HIGH (ref 0–149)
VLDL Cholesterol Cal: 29 mg/dL (ref 5–40)

## 2024-07-29 LAB — TSH+FREE T4
Free T4: 0.94 ng/dL (ref 0.82–1.77)
TSH: 0.662 u[IU]/mL (ref 0.450–4.500)

## 2024-07-29 LAB — HEMOGLOBIN A1C
Est. average glucose Bld gHb Est-mCnc: 103 mg/dL
Hgb A1c MFr Bld: 5.2 % (ref 4.8–5.6)

## 2024-07-29 LAB — VITAMIN D 25 HYDROXY (VIT D DEFICIENCY, FRACTURES): Vit D, 25-Hydroxy: 12.1 ng/mL — ABNORMAL LOW (ref 30.0–100.0)

## 2024-07-29 MED ORDER — VITAMIN D (ERGOCALCIFEROL) 1.25 MG (50000 UNIT) PO CAPS: 50000.0000 [IU] | ORAL_CAPSULE | ORAL | 1 refills | Status: AC

## 2024-07-30 ENCOUNTER — Ambulatory Visit
Admission: RE | Admit: 2024-07-30 | Discharge: 2024-07-30 | Disposition: A | Discharge status: Home / Self Care | Source: Ambulatory Visit | Attending: Family Medicine | Admitting: Family Medicine

## 2024-07-30 DIAGNOSIS — E282 Polycystic ovarian syndrome: Secondary | ICD-10-CM | POA: Diagnosis present

## 2024-07-30 DIAGNOSIS — N920 Excessive and frequent menstruation with regular cycle: Secondary | ICD-10-CM | POA: Diagnosis present

## 2024-08-02 ENCOUNTER — Encounter: Payer: Self-pay | Admitting: Family Medicine

## 2024-08-02 ENCOUNTER — Ambulatory Visit: Admitting: Family Medicine

## 2024-08-02 ENCOUNTER — Institutional Professional Consult (permissible substitution): Admitting: Nurse Practitioner

## 2024-08-02 VITALS — BP 114/76 | HR 84 | Ht 61.0 in | Wt 243.0 lb

## 2024-08-02 DIAGNOSIS — E66813 Obesity, class 3: Secondary | ICD-10-CM

## 2024-08-02 DIAGNOSIS — Z9884 Bariatric surgery status: Secondary | ICD-10-CM

## 2024-08-02 DIAGNOSIS — Z6841 Body Mass Index (BMI) 40.0 and over, adult: Secondary | ICD-10-CM | POA: Diagnosis not present

## 2024-08-02 DIAGNOSIS — G4733 Obstructive sleep apnea (adult) (pediatric): Secondary | ICD-10-CM | POA: Diagnosis not present

## 2024-08-02 DIAGNOSIS — R635 Abnormal weight gain: Secondary | ICD-10-CM

## 2024-08-02 NOTE — Progress Notes (Signed)
 " Office: [phone]  /  Fax: [phone]   Initial Visit  Nancy Owen was seen in clinic today to evaluate for obesity. She is interested in losing weight to improve overall health and reduce the risk of weight related complications. She presents today to review program treatment options, initial physical assessment, and evaluation.     She was referred by: Specialist  When asked what else they would like to accomplish? She states: Adopt a healthier eating pattern and lifestyle, Improve energy levels and physical activity, Improve existing medical conditions, Improve quality of life, and Improve appearance  Weight history: 2017 273 lb VSG - Dr Mikell, nadir was 180 lb; regained in 2019 with work from home job; more sedentary, stopped CPAP 10/2021 ~250 lb and had RYGB with Dr Gladis.  Has not felt restriction or food aversions.  Has not seen weight loss.   Has some milk intolerance Had some weight loss on Phentermine , last used around 2022  When asked how has your weight affected you? She states: Contributed to medical problems and Problems with eating patterns  Some associated conditions: OSA and Prediabetes  Contributing factors: family history of obesity, disruption of circadian rhythm / sleep disordered breathing, moderate to high levels of stress, reduced physical activity, chronic skipping of meals, history of metabolic surgery, multiple weight loss attempts in the past, and sedentary job  Weight promoting medications identified: None  Current nutrition plan: None  Current level of physical activity: Walking 30 minutes, three a week Likes to walk outdoors  Current or previous pharmacotherapy: Phentermine   Response to medication: Lost weight initially but was unable to sustain weight loss   Past medical history includes:   Past Medical History:  Diagnosis Date   Allergic rhinitis    Anemia    Encounter for insertion of Mirena  IUD    GERD (gastroesophageal  reflux disease)    Gonorrhea    Headache    Herpes genitalia    High-risk pregnancy 01/17/2014   chorio/mild utering atony   History of hiatal hernia    History of syphilis    Obesity    Sleep apnea    awaitin new one   Syphilis      Objective:   BP 114/76   Pulse 84   Ht 5' 1 (1.549 m)   Wt 243 lb (110.2 kg)   LMP 07/11/2024 (Exact Date)   SpO2 99%   BMI 45.91 kg/m  She was weighed on the bioimpedance scale: Body mass index is 45.91 kg/m.  Peak Weight:273 , Body Fat%:49.3, Visceral Fat Rating:15, Weight trend over the last 12 months: Unchanged  General:  Alert, oriented and cooperative. Patient is in no acute distress.  Respiratory: Normal respiratory effort, no problems with respiration noted   Gait: able to ambulate independently  Mental Status: Normal mood and affect. Normal behavior. Normal judgment and thought content.   DIAGNOSTIC DATA REVIEWED:  BMET    Component Value Date/Time   NA 140 07/28/2024 1629   K 4.1 07/28/2024 1629   CL 106 07/28/2024 1629   CO2 20 07/28/2024 1629   GLUCOSE 85 07/28/2024 1629   GLUCOSE 94 09/04/2022 1507   BUN 9 07/28/2024 1629   CREATININE 0.60 07/28/2024 1629   CALCIUM 8.9 07/28/2024 1629   GFRNONAA >60 09/04/2022 1507   GFRAA 113 06/24/2012 0850   Lab Results  Component Value Date   HGBA1C 5.2 07/28/2024   HGBA1C 5.7 (H) 05/07/2019   No results found for: INSULIN  CBC  Component Value Date/Time   WBC 13.1 (H) 06/14/2024 1605   WBC 5.6 09/04/2022 1507   RBC 4.21 06/14/2024 1605   RBC 4.17 06/14/2024 1605   HGB 11.7 (L) 06/14/2024 1605   HGB 10.6 (L) 05/07/2019 1147   HCT 35.7 (L) 06/14/2024 1605   HCT 33.6 (L) 05/07/2019 1147   PLT 341 06/14/2024 1605   PLT 311 05/07/2019 1147   MCV 85.6 06/14/2024 1605   MCV 80 05/07/2019 1147   MCV 85 01/14/2014 2029   MCH 28.1 06/14/2024 1605   MCHC 32.8 06/14/2024 1605   RDW 13.6 06/14/2024 1605   RDW 14.5 05/07/2019 1147   RDW 18.2 (H) 01/14/2014 2029    Iron /TIBC/Ferritin/ %Sat    Component Value Date/Time   IRON  44 06/14/2024 1605   TIBC 423 06/14/2024 1605   FERRITIN 43 06/14/2024 1605   IRONPCTSAT 11 06/14/2024 1605   Lipid Panel     Component Value Date/Time   CHOL 152 07/28/2024 1629   TRIG 167 (H) 07/28/2024 1629   HDL 45 07/28/2024 1629   CHOLHDL 3.4 07/28/2024 1629   CHOLHDL 4 01/03/2015 1400   VLDL 31.6 01/03/2015 1400   LDLCALC 78 07/28/2024 1629   Hepatic Function Panel     Component Value Date/Time   PROT 7.3 07/28/2024 1629   ALBUMIN 4.1 07/28/2024 1629   AST 13 07/28/2024 1629   ALT 22 07/28/2024 1629   ALKPHOS 116 07/28/2024 1629   BILITOT 0.3 07/28/2024 1629   BILIDIR <0.1 10/19/2020 1757   IBILI NOT CALCULATED 10/19/2020 1757      Component Value Date/Time   TSH 0.662 07/28/2024 1629     Assessment and Plan:   Weight gain following gastric bypass surgery  Class III morbid obesity with body mass index (BMI) of 40.0 to 49.9 (HCC)  OSA (obstructive sleep apnea)        Obesity Treatment / Action Plan:  Patient will work on garnering support from family and friends to begin weight loss journey. Will work on eliminating or reducing the presence of highly palatable, calorie dense foods in the home. Will complete provided nutritional and psychosocial assessment questionnaire before the next appointment. Will be scheduled for indirect calorimetry to determine resting energy expenditure in a fasting state.  This will allow us  to create a reduced calorie, high-protein meal plan to promote loss of fat mass while preserving muscle mass. Will think about ideas on how to incorporate physical activity into their daily routine. Was counseled on nutritional approaches to weight loss and benefits of reducing processed foods and consuming plant-based foods and high quality protein as part of nutritional weight management. Was counseled on pharmacotherapy and role as an adjunct in weight management.    Obesity Education Performed Today:  She was weighed on the bioimpedance scale and results were discussed and documented in the synopsis.  We discussed obesity as a disease and the importance of a more detailed evaluation of all the factors contributing to the disease.  We discussed the importance of long term lifestyle changes which include nutrition, exercise and behavioral modifications as well as the importance of customizing this to her specific health and social needs.  We discussed the benefits of reaching a healthier weight to alleviate the symptoms of existing conditions and reduce the risks of the biomechanical, metabolic and psychological effects of obesity.  Nancy Owen appears to be in the action stage of change and states they are ready to start intensive lifestyle modifications and behavioral modifications.  20 minutes was spent today on this visit including the above counseling, pre-visit chart review, and post-visit documentation.  Reviewed by clinician on day of visit: allergies, medications, problem list, medical history, surgical history, family history, social history, and previous encounter notes pertinent to obesity diagnosis.    Darice Haddock, D.O. DABFM, [facility] [address] [place], [place] [address] [phone]   "

## 2024-08-19 ENCOUNTER — Inpatient Hospital Stay: Attending: Oncology

## 2024-08-19 ENCOUNTER — Ambulatory Visit: Admitting: Family Medicine

## 2024-08-19 ENCOUNTER — Inpatient Hospital Stay

## 2024-08-19 ENCOUNTER — Encounter: Payer: Self-pay | Admitting: Family Medicine

## 2024-08-19 VITALS — BP 104/63 | HR 86 | Temp 97.9°F | Ht 61.0 in | Wt 247.0 lb

## 2024-08-19 DIAGNOSIS — Z6841 Body Mass Index (BMI) 40.0 and over, adult: Secondary | ICD-10-CM | POA: Diagnosis not present

## 2024-08-19 DIAGNOSIS — R0602 Shortness of breath: Secondary | ICD-10-CM | POA: Diagnosis not present

## 2024-08-19 DIAGNOSIS — Z9884 Bariatric surgery status: Secondary | ICD-10-CM | POA: Diagnosis not present

## 2024-08-19 DIAGNOSIS — K912 Postsurgical malabsorption, not elsewhere classified: Secondary | ICD-10-CM | POA: Diagnosis not present

## 2024-08-19 DIAGNOSIS — E538 Deficiency of other specified B group vitamins: Secondary | ICD-10-CM | POA: Diagnosis not present

## 2024-08-19 DIAGNOSIS — R5383 Other fatigue: Secondary | ICD-10-CM | POA: Diagnosis not present

## 2024-08-19 DIAGNOSIS — F3289 Other specified depressive episodes: Secondary | ICD-10-CM

## 2024-08-19 DIAGNOSIS — E559 Vitamin D deficiency, unspecified: Secondary | ICD-10-CM | POA: Diagnosis not present

## 2024-08-19 DIAGNOSIS — D508 Other iron deficiency anemias: Secondary | ICD-10-CM | POA: Diagnosis not present

## 2024-08-19 DIAGNOSIS — G4733 Obstructive sleep apnea (adult) (pediatric): Secondary | ICD-10-CM | POA: Diagnosis not present

## 2024-08-19 MED ORDER — CYANOCOBALAMIN 1000 MCG/ML IJ SOLN
1000.0000 ug | Freq: Once | INTRAMUSCULAR | Status: AC
  Administered 2024-08-19: 1000 ug via INTRAMUSCULAR
  Filled 2024-08-19: qty 1

## 2024-08-19 NOTE — Progress Notes (Signed)
 "  At a Glance:  Vitals Temp: 97.9 F (36.6 C) BP: 104/63 Pulse Rate: 86 SpO2: 95 %   Anthropometric Measurements Height: 5' 1 (1.549 m) Weight: 247 lb (112 kg) BMI (Calculated): 46.69 Starting Weight: 247lb Peak Weight: 273lb   Body Composition  Body Fat %: 52 % Fat Mass (lbs): 128.6 lbs Muscle Mass (lbs): 112.8 lbs Visceral Fat Rating : 16   Other Clinical Data RMR: 1757 Fasting: Yes Labs: Yes Today's Visit #: 1 Starting Date: 08/19/24    EKG: Normal sinus rhythm, rate 80.  Indirect Calorimeter completed today shows a VO2 of 254 and a REE of 1757.  Her calculated basal metabolic rate is 8260 thus her basal metabolic rate is better than expected.  Chief Complaint:  Obesity   Subjective:  Nancy Owen (MR# [id number]) is a 39 y.o. female who presents for evaluation and treatment of obesity and related comorbidities.   Nancy Owen is currently in the action stage of change and ready to dedicate time achieving and maintaining a healthier weight. Nancy Owen is interested in becoming our patient and working on intensive lifestyle modifications including (but not limited to) diet and exercise for weight loss.  Nancy Owen has been struggling with her weight. She has been unsuccessful in either losing weight, maintaining weight loss, or reaching her healthy weight goal.  She lives w/ her husband and 64 yo daughter.  Menses are regular and heavy, not on birth control.  Works from home with sedentary lifestyle.  Has access to apartment gym.  Tends to skip breakfast and craves SSBs/ sugar.  Bariatric surgery hx: VSG in 2017 pre op weight 273--> 180 nadir at [facility] Revised to RYGB 10/2021 250 lb-- no weight loss Did use phentermine  in past  Harbor's habits were reviewed today and are as follows: Her family eats meals together, her desired weight loss is 100 lb, she started gaining weight in college, she has significant food cravings issues, she skips meals frequently, she is  frequently drinking liquids with calories, she frequently makes poor food choices, she has problems with excessive hunger, she frequently eats larger portions than normal, and she struggles with emotional eating.   Other Fatigue Nancy Owen admits to daytime somnolence and admits to waking up still tired. Patient has a history of symptoms of daytime fatigue. Nancy Owen generally gets 5 or 6 hours of sleep per night, and states that she has nightime awakenings. Snoring is present. Apneic episodes are present. Epworth Sleepiness Score is 19.   Recently started CPAP for mod OSA, AHI 25.7 07/23/21  Shortness of Breath Nancy Owen notes increasing shortness of breath with exercising and seems to be worsening over time with weight gain. She notes getting out of breath sooner with activity than she used to. This has gotten worse recently. Nancy Owen denies shortness of breath at rest or orthopnea.   Depression Screen Nancy Owen's Food and Mood (modified PHQ-9) score was 16.     08/19/2024    7:13 AM  Depression screen PHQ 2/9  Decreased Interest 0  Down, Depressed, Hopeless 1  PHQ - 2 Score 1  Altered sleeping 3  Tired, decreased energy 3  Change in appetite 3  Feeling bad or failure about yourself  1  Trouble concentrating 2  Moving slowly or fidgety/restless 3  Suicidal thoughts 0  PHQ-9 Score 16     Assessment and Plan:   Other Fatigue Nancy Owen does feel that her weight is causing her energy to be lower than it should be. Fatigue may be related  to obesity, depression or many other causes. Labs will be ordered, and in the meanwhile, Nancy Owen will focus on self care including making healthy food choices, increasing physical activity and focusing on stress reduction.  Shortness of Breath Nancy Owen does feel that she gets out of breath more easily that she used to when she exercises. Nancy Owen's shortness of breath appears to be obesity related and exercise induced. She has agreed to work on weight loss and  gradually increase exercise to treat her exercise induced shortness of breath. Will continue to monitor closely.  Nancy Owen had a positive depression screening. Depression is commonly associated with obesity and often results in emotional eating behaviors. We will monitor this closely and work on CBT to help improve the non-hunger eating patterns. Referral to Psychology may be required if no improvement is seen as she continues in our clinic.    Problem List Items Addressed This Visit   None Visit Diagnoses       SOBOE (shortness of breath on exertion)    -  Primary     Other depression     No on medication for mood Emotional eating present Plan on CBT as part of treatment plan      Other fatigue       Relevant Orders   EKG 12-Lead (Completed)     Postoperative intestinal malabsorption    Begin 2 Flintstone Chewable MVI daily (currently not taking a MVI)    Relevant Orders   Folate   CBC with Differential/Platelet   Prealbumin   Vitamin B1     Weight gain following gastric bypass surgery Discussed the importance of cutting out high intake of liquid calories.  Currently getting in excess amounts of sugar.  Has limited restriction s/p revision bariatric surgery.  Drink water  outside of meals.  Focus on lean protein and fiber with meals, creation of calorie deficit based on REE and current activity level (low)  Plan on: BELT program, CBT, RD visits Consider addition of GLP-1 RA       Relevant Orders   Insulin , random     Morbid obesity (HCC)         BMI 45.0-49.9, adult (HCC)         OSA on CPAP   Aim for 8 hrs of high quality sleep at night using CPAP As energy level improves, her need for excess caffeine and sugar should reduce Discussed need to reduce sugar and caffeine intake for general health and the importance of sleep for metabolic health        Other iron  deficiency anemia   Seeing hematology for IV iron  infusions q3 mos Last iron  labs WNL Heavy menses and hx of gastric  bypass contributing factors        Vitamin D  deficiency     Continue RX vitamin D  weekly Last vitamin D  level low      B12 deficiency    On B12 injections monthly B12 labs reviewed At risk for chronic B12 deficiency s/p RYGB surgery        Nancy Owen is currently in the action stage of change and her goal is to get back to weightloss efforts . I recommend Nancy Owen begin the structured treatment plan as follows:  She has agreed to keeping a food journal and adhering to recommended goals of 1400 calories and 90-100 g of protein Instructed on using Chat GPT to create a meal plan as reviewed on AVS with patient  Exercise goals: All adults should avoid inactivity. Some  activity is better than none, and adults who participate in any amount of physical activity, gain some health benefits. Begin wearing smart watch daily to track steps  Behavioral modification strategies:increasing lean protein intake, increase H2O intake, decrease liquid calories, increase high fiber foods, better snacking choices, planning for success, and decrease junk food   She was informed of the importance of frequent follow-up visits to maximize her success with intensive lifestyle modifications for her multiple health conditions. She was informed we would discuss her lab results at her next visit unless there is a critical issue that needs to be addressed sooner. Nancy Owen agreed to keep her next visit at the agreed upon time to discuss these results.  Objective:  General: Cooperative, alert, well developed, in no acute distress. HEENT: Conjunctivae and lids unremarkable. Cardiovascular: Regular rhythm.  Lungs: Normal work of breathing. Neurologic: No focal deficits.   Lab Results  Component Value Date   CREATININE 0.60 07/28/2024   BUN 9 07/28/2024   NA 140 07/28/2024   K 4.1 07/28/2024   CL 106 07/28/2024   CO2 20 07/28/2024   Lab Results  Component Value Date   ALT 22 07/28/2024   AST 13 07/28/2024    ALKPHOS 116 07/28/2024   BILITOT 0.3 07/28/2024   Lab Results  Component Value Date   HGBA1C 5.2 07/28/2024   HGBA1C 5.7 (H) 05/07/2019   No results found for: INSULIN  Lab Results  Component Value Date   TSH 0.662 07/28/2024   Lab Results  Component Value Date   CHOL 152 07/28/2024   HDL 45 07/28/2024   LDLCALC 78 07/28/2024   TRIG 167 (H) 07/28/2024   CHOLHDL 3.4 07/28/2024   Lab Results  Component Value Date   WBC 13.1 (H) 06/14/2024   HGB 11.7 (L) 06/14/2024   HCT 35.7 (L) 06/14/2024   MCV 85.6 06/14/2024   PLT 341 06/14/2024   Lab Results  Component Value Date   IRON  44 06/14/2024   TIBC 423 06/14/2024   FERRITIN 43 06/14/2024    Attestation Statements:  Reviewed by clinician on day of visit: allergies, medications, problem list, medical history, surgical history, family history, social history, and previous encounter notes.  Time spent on visit including pre-visit chart review and post-visit charting and face- to face care including nutritional counseling, review of EKG, interpretation of body composition scale and indirect calorimetry and nutrition prescription  was 46 minutes.   Darice Haddock, D.O. DABFM, DABOM [facility] [address] [place], [place] [address] [phone]  "

## 2024-08-19 NOTE — Patient Instructions (Signed)
 Use the Chat GPT app to create a meal plan: 1400 calorie post bariatric surgery meal plan with protein coffee for breakfast, lunch, dinner and one snack daily  You can swap out meals and snacks but keep the calories the same Read labels on food and drink for sugar Avoid products that have > 8 g of sugar per serving Try swap outs like: Regular sugar drinks--> zero sugar options Candy --> Joyride or smart sweets Juice--> flavored water  or Sp[employer]rift Sweet coffee drinks --> espresso or black iced coffee+ premixed protein shake  Begin tracking steps with smart watch daily Begin 2 Flintstone Chewable completes daily

## 2024-09-02 ENCOUNTER — Ambulatory Visit: Admitting: Family Medicine

## 2024-09-16 ENCOUNTER — Inpatient Hospital Stay: Attending: Oncology

## 2024-10-18 ENCOUNTER — Inpatient Hospital Stay: Attending: Oncology

## 2024-11-03 ENCOUNTER — Ambulatory Visit: Admitting: Family Medicine

## 2024-11-17 ENCOUNTER — Inpatient Hospital Stay: Attending: Oncology

## 2024-12-13 ENCOUNTER — Inpatient Hospital Stay

## 2024-12-15 ENCOUNTER — Inpatient Hospital Stay: Admitting: Oncology

## 2024-12-15 ENCOUNTER — Inpatient Hospital Stay
